# Patient Record
Sex: Female | Born: 1945 | Race: White | Hispanic: No | Marital: Married | Smoking: Never smoker
Health system: Southern US, Community
[De-identification: ages and names within clinical notes are randomized; demographics above are authoritative.]

## PROBLEM LIST (undated history)

## (undated) DIAGNOSIS — H269 Unspecified cataract: Secondary | ICD-10-CM

## (undated) DIAGNOSIS — H409 Unspecified glaucoma: Secondary | ICD-10-CM

## (undated) DIAGNOSIS — R011 Cardiac murmur, unspecified: Secondary | ICD-10-CM

## (undated) DIAGNOSIS — M1712 Unilateral primary osteoarthritis, left knee: Secondary | ICD-10-CM

## (undated) DIAGNOSIS — M1990 Unspecified osteoarthritis, unspecified site: Secondary | ICD-10-CM

## (undated) DIAGNOSIS — F419 Anxiety disorder, unspecified: Secondary | ICD-10-CM

## (undated) DIAGNOSIS — Z8489 Family history of other specified conditions: Secondary | ICD-10-CM

## (undated) DIAGNOSIS — E7800 Pure hypercholesterolemia, unspecified: Secondary | ICD-10-CM

## (undated) DIAGNOSIS — I10 Essential (primary) hypertension: Secondary | ICD-10-CM

## (undated) DIAGNOSIS — K219 Gastro-esophageal reflux disease without esophagitis: Secondary | ICD-10-CM

## (undated) HISTORY — DX: Unspecified glaucoma: H40.9

## (undated) HISTORY — DX: Pure hypercholesterolemia, unspecified: E78.00

## (undated) HISTORY — DX: Unspecified cataract: H26.9

## (undated) HISTORY — DX: Gastro-esophageal reflux disease without esophagitis: K21.9

## (undated) HISTORY — DX: Cardiac murmur, unspecified: R01.1

## (undated) HISTORY — PX: JOINT REPLACEMENT: SHX530

## (undated) HISTORY — PX: KNEE SURGERY: SHX244

## (undated) HISTORY — PX: CHOLECYSTECTOMY OPEN: SUR202

## (undated) HISTORY — PX: COLONOSCOPY W/ POLYPECTOMY: SHX1380

## (undated) HISTORY — PX: KNEE ARTHROSCOPY W/ MENISCECTOMY: SHX1879

## (undated) HISTORY — PX: APPENDECTOMY: SHX54

## (undated) HISTORY — DX: Essential (primary) hypertension: I10

## (undated) HISTORY — PX: OOPHORECTOMY: SHX86

## (undated) HISTORY — PX: TUBAL LIGATION: SHX77

---

## 2000-07-09 ENCOUNTER — Encounter: Payer: Self-pay | Admitting: Cardiology

## 2000-07-09 ENCOUNTER — Ambulatory Visit (HOSPITAL_COMMUNITY): Admission: RE | Admit: 2000-07-09 | Discharge: 2000-07-09 | Payer: Self-pay | Admitting: Cardiology

## 2001-04-01 ENCOUNTER — Ambulatory Visit (HOSPITAL_COMMUNITY): Admission: RE | Admit: 2001-04-01 | Discharge: 2001-04-01 | Payer: Self-pay | Admitting: Internal Medicine

## 2001-04-01 ENCOUNTER — Encounter: Payer: Self-pay | Admitting: Internal Medicine

## 2001-04-29 ENCOUNTER — Other Ambulatory Visit: Admission: RE | Admit: 2001-04-29 | Discharge: 2001-04-29 | Payer: Self-pay | Admitting: Family Medicine

## 2002-02-25 ENCOUNTER — Encounter: Payer: Self-pay | Admitting: Emergency Medicine

## 2002-02-25 ENCOUNTER — Emergency Department (HOSPITAL_COMMUNITY): Admission: EM | Admit: 2002-02-25 | Discharge: 2002-02-25 | Payer: Self-pay | Admitting: Emergency Medicine

## 2003-04-24 ENCOUNTER — Ambulatory Visit (HOSPITAL_COMMUNITY): Admission: RE | Admit: 2003-04-24 | Discharge: 2003-04-24 | Payer: Self-pay | Admitting: Family Medicine

## 2004-09-25 ENCOUNTER — Ambulatory Visit (HOSPITAL_COMMUNITY): Admission: RE | Admit: 2004-09-25 | Discharge: 2004-09-25 | Payer: Self-pay | Admitting: Family Medicine

## 2004-12-05 ENCOUNTER — Ambulatory Visit (HOSPITAL_COMMUNITY): Admission: RE | Admit: 2004-12-05 | Discharge: 2004-12-05 | Payer: Self-pay | Admitting: Obstetrics & Gynecology

## 2004-12-16 ENCOUNTER — Other Ambulatory Visit: Admission: RE | Admit: 2004-12-16 | Discharge: 2004-12-16 | Payer: Self-pay | Admitting: Obstetrics and Gynecology

## 2004-12-26 ENCOUNTER — Emergency Department (HOSPITAL_COMMUNITY): Admission: EM | Admit: 2004-12-26 | Discharge: 2004-12-26 | Payer: Self-pay | Admitting: Emergency Medicine

## 2005-07-09 ENCOUNTER — Ambulatory Visit (HOSPITAL_COMMUNITY): Admission: RE | Admit: 2005-07-09 | Discharge: 2005-07-09 | Payer: Self-pay | Admitting: Family Medicine

## 2005-10-02 ENCOUNTER — Ambulatory Visit (HOSPITAL_COMMUNITY): Admission: RE | Admit: 2005-10-02 | Discharge: 2005-10-02 | Payer: Self-pay | Admitting: General Surgery

## 2006-11-12 ENCOUNTER — Ambulatory Visit (HOSPITAL_COMMUNITY): Admission: RE | Admit: 2006-11-12 | Discharge: 2006-11-12 | Payer: Self-pay | Admitting: Family Medicine

## 2007-11-30 ENCOUNTER — Ambulatory Visit (HOSPITAL_COMMUNITY): Admission: RE | Admit: 2007-11-30 | Discharge: 2007-11-30 | Payer: Self-pay | Admitting: Family Medicine

## 2008-01-12 ENCOUNTER — Other Ambulatory Visit: Admission: RE | Admit: 2008-01-12 | Discharge: 2008-01-12 | Payer: Self-pay | Admitting: Obstetrics and Gynecology

## 2008-05-03 ENCOUNTER — Emergency Department (HOSPITAL_COMMUNITY): Admission: EM | Admit: 2008-05-03 | Discharge: 2008-05-04 | Payer: Self-pay | Admitting: Emergency Medicine

## 2008-05-29 ENCOUNTER — Ambulatory Visit: Payer: Self-pay | Admitting: Gastroenterology

## 2008-05-30 ENCOUNTER — Encounter: Payer: Self-pay | Admitting: Gastroenterology

## 2008-05-30 ENCOUNTER — Ambulatory Visit: Payer: Self-pay | Admitting: Gastroenterology

## 2008-05-30 ENCOUNTER — Ambulatory Visit (HOSPITAL_COMMUNITY): Admission: RE | Admit: 2008-05-30 | Discharge: 2008-05-30 | Payer: Self-pay | Admitting: Gastroenterology

## 2008-07-24 ENCOUNTER — Ambulatory Visit: Payer: Self-pay | Admitting: Gastroenterology

## 2009-05-14 ENCOUNTER — Encounter: Payer: Self-pay | Admitting: Gastroenterology

## 2009-06-21 ENCOUNTER — Encounter (INDEPENDENT_AMBULATORY_CARE_PROVIDER_SITE_OTHER): Payer: Self-pay | Admitting: *Deleted

## 2010-06-23 ENCOUNTER — Encounter: Payer: Self-pay | Admitting: Family Medicine

## 2010-07-01 NOTE — Letter (Signed)
Summary: Appointment Reminder  [facility]  [address]   [place], [place] [address]   Phone: [phone]  Fax: [phone]       June 21, 2009   [name] [address] [place], [place]  [address] 11/08/1945    Dear Ms. Alvarado,  We have been unable to reach you by phone to schedule a follow up   appointment that was recommended for you by Dr. Fields. It is very   important that we reach you to schedule an appointment. We hope that you  allow us to participate in your health care needs. Please contact us at  [phone] at your earliest convenience to schedule your appointment.  Sincerely,    Virginia Urshel  [facility] R. Michael Rourk, M.D.    Sandi Manus, M.D. Kandice Jones, FNP-BC    Leslie Lewis, PA-C Phone: [phone]    Fax: [phone] 

## 2010-07-01 NOTE — Letter (Signed)
Summary: Appointment Reminder  [facility]  [address]   [place], [place] [address]   Phone: [phone]  Fax: [phone]       June 21, 2009   [name] [address] [place], [place]  [address] [birth year]    Dear Ms. CAYTON,  We have been unable to reach you by phone to schedule a follow up   appointment that was recommended for you by Dr. Darrick Penna. It is very   important that we reach you to schedule an appointment. We hope that you  allow [place] to participate in your health care needs. Please contact us at  [phone] at your earliest convenience to schedule your appointment.  Sincerely,    [facility] R. Roetta Sessions, M.D.    Kassie Mends, M.D. Lorenza Burton, FNP-BC    Tana Coast, PA-C Phone: [phone]    Fax: [phone]

## 2010-09-10 ENCOUNTER — Other Ambulatory Visit: Payer: Self-pay | Admitting: Obstetrics and Gynecology

## 2010-09-10 DIAGNOSIS — Z139 Encounter for screening, unspecified: Secondary | ICD-10-CM

## 2010-09-22 ENCOUNTER — Ambulatory Visit (HOSPITAL_COMMUNITY)
Admission: RE | Admit: 2010-09-22 | Discharge: 2010-09-22 | Disposition: A | Discharge status: Home / Self Care | Payer: Medicare Other | Source: Ambulatory Visit | Attending: Obstetrics and Gynecology | Admitting: Obstetrics and Gynecology

## 2010-09-22 DIAGNOSIS — Z139 Encounter for screening, unspecified: Secondary | ICD-10-CM

## 2010-09-22 DIAGNOSIS — Z1231 Encounter for screening mammogram for malignant neoplasm of breast: Secondary | ICD-10-CM | POA: Insufficient documentation

## 2010-10-14 NOTE — Op Note (Signed)
[name], [name]                 ACCOUNT NO.:  [id number]   MEDICAL RECORD NO.:  [id number]          PATIENT TYPE:  AMB   LOCATION:  DAY                           FACILITY:  [facility]   PHYSICIAN:  Kassie Mends, M.D.      DATE OF BIRTH:  [birth year]   DATE OF PROCEDURE:  05/30/2008  DATE OF DISCHARGE:                               OPERATIVE REPORT   REFERRING [name]:  Kirk Ruths, MD   PROCEDURE:  Esophagogastroduodenoscopy with cold forceps biopsy and  Savary dilation to 17 mm.   INDICATION FOR EXAMINATION:  Ms. [name] is a 65 year old female who  presents with solid dysphagia.  She also uses aspirin and diclofenac on  a regular basis.  She had a colonoscopy 3 years ago by Dr. Lovell Sheehan,  which showed sigmoid colon diverticulosis, otherwise was unremarkable.   FINDINGS:  1. Distal esophageal ring.  Otherwise, no evidence of Barrett mass,      erosion, or ulceration.  The distal esophagus was dilated to 1/7th-      mm with the Savary dilator.  2. Patchy erythema in the body and the antrum with occasional erosion.      Biopsies obtained via cold forceps to evaluate for H. pylori      gastritis.  3. Normal duodenum, and second portion of the duodenal bulb and second      portion of the duodenum.   DIAGNOSES:  1. Distal esophageal ring, status post dilation.  2. Mild gastritis.   RECOMMENDATIONS:  1. She should continue Nexium 30 minutes prior to the first meal.  2. She should hold the aspirin and diclofenac until June 12, 2008.  3. She is given a handout on reflux and gastritis.  4. No anticoagulation for 5 days.   MEDICATIONS:  1. Demerol 100 mg IV.  2. Versed 4 mg IV.  3. Phenergan 12.5 mg IV.   PROCEDURE TECHNIQUE:  Physical exam was performed.  Informed consent was  obtained from the patient after explaining the benefits, risks, and  alternatives to the procedure.  The patient connected to monitor and  placed in the left lateral position.  Continuous oxygen was  provided by  nasal cannula.  IV medicine administered through an indwelling cannula.  After administration of sedation, the patient's esophagus was intubated.  The scope was advanced under direct visualization to the second portion  of the duodenum.  The scope was withdrawn slowly by carefully examining  the color, texture, anatomy, and integrity of the mucosa on the way out.  Prior to removing the scope, a retroflexed view of the cardia was  performed and distal esophageal ring was identified.  The Savary  guidewire was inserted.  The esophagus was dilated successively using  the 14-mm to 17-mm dilator.  The dilator and the guidewire were removed.  The patient was recovered in endoscopy and discharged home in  satisfactory condition.   PATH:  Chronic active gastitis. Minimize use of NSAIDs. Continue Nexium.      Kassie Mends, M.D.  Electronically Signed     SM/MEDQ  D:  05/30/2008  T:  05/31/2008  Job:  [id number]   cc:   Kirk Ruths, M.D.  Fax: [phone]

## 2010-10-14 NOTE — Assessment & Plan Note (Signed)
[name], [name]                  CHART#:  [id number]   DATE:  07/24/2008                       DOB:  [birth year]   REFERRING Devera Englander:  Kirk Ruths, MD   PROBLEM LIST:  1. Distal esophageal ring dilated to 17 mm in December 2009.  2. Mild gastritis.  3. Allergies.  4. Insomnia.  5. Arthritis.  6. Cholecystectomy in 1980s (stones).   SUBJECTIVE:  The patient is a 65 year old female who was seen in  December 2009 for difficulty swallowing.  She also had a history of H.  pylori gastritis, which was treated.  She subsequently underwent EGD  with dilation in December 2009.  She reports that she is not having any  problems swallowing.  She would like to restart the diclofenac because  it works for her arthritis.  She has only used Carafate once since her  upper endoscopy.  She is not have any problems with heartburn or  indigestion.   ALLERGIES:  Cefzil.   MEDICATIONS:  1. Xanax 0.5 mg daily.  2. Hydrochlorothiazide 12.5 mg daily.  3. Sucralfate as needed.  4. Fish oil.  5. CoQ10 two daily.  6. Vitamin D3 daily.  7. Multivitamin daily.  8. Zyrtec daily.  9. Aspirin 81 mg daily.  10.Tylenol Arthritis as needed.  11.Prilosec over the counter twice a day.   OBJECTIVE:  VITAL SIGNS:  Weight 186 pounds (up 3 pounds since December  2009), height 5 feet 5 inches, temperature 97.9, blood pressure 130/82,  and pulse 76.  GENERAL:  She is in no apparent distress.  Alert and oriented x4. LUNGS:  Clear to auscultation bilaterally.CARDIOVASCULAR:  Regular rhythm.  ABDOMEN:  Bowel sounds present, soft, nontender, nondistended.   ASSESSMENT:  The patient is a 65 year old female who had dysphagia,  which has now resolved.  She does not have any problem with heartburn or  indigestion while taking her Prilosec twice a day.  Thank you for  allowing me to see the patient in consultation.  My recommendations  follow.   RECOMMENDATIONS:  1. She may try to decrease the omeprazole to  once a day.  2. Follow up appointment in 12 months. Will refill meds up to one      year.       Kassie Mends, M.D.  Electronically Signed     SM/MEDQ  D:  07/25/2008  T:  07/25/2008  Job:  [id number]   cc:   Kirk Ruths, M.D.

## 2010-10-14 NOTE — Consult Note (Signed)
[name], [name]                 ACCOUNT NO.:  [id number]   MEDICAL RECORD NO.:  [id number]          PATIENT TYPE:  AMB   LOCATION:  DAY                           FACILITY:  [facility]   PHYSICIAN:  Kassie Mends, M.D.      DATE OF BIRTH:  27-Jun-1945   DATE OF CONSULTATION:  05/29/2008  DATE OF DISCHARGE:                                 CONSULTATION   REASON FOR CONSULTATION:  Dysphagia.   HISTORY OF PRESENT ILLNESS:  Ms. [name] is a 65 year-old female who has  gastroesophageal reflux disease and is usually maintained on Nexium.  She ran out of her Nexium and began to use over the counter Prilosec.  She was taking one.  She went to [employer] and ate salsa, and 2 hours  later she couldn't swallow good.  She ended up having chest pain and  going to the emergency department.  She was given medication and  Protonix.  The Protonix helps.  She has seen by Ms. Evans and sucralfate  was added as well.  She states her insurance is going to run out by the  end of the year.  She cannot afford the policy, which covers  medications.  She will have insurance for hospitalizations.  She  denies any heartburn and indigestion while taking Nexium or Protonix.  Her symptoms are improved since her ED visit on May 04, 2008.  She  still continues to have had intermittent problems with meat or food with  peeling.  She has had an intentional 50-pound weight loss over the last  5 years.  She denies any nausea, vomiting or abdominal pain.  She is not  have any diarrhea, constipation, blood or stool or black tarry stools.  She takes an aspirin 81 mg daily, but denies BC Powders, Goody's  Powders, ibuprofen or Motrin.  She had a history of H. pylori gastritis  and she states she was told by Dr. Regino Schultze that she should never take  ibuprofen or Motrin again.  She reports having a colonoscopy 3 years ago  at [facility].   PAST MEDICAL HISTORY:  1. Allergies.  2. Insomnia.  3. History of H. pylori gastritis.  4.  Arthritis in her knees.   PAST SURGICAL HISTORY:  1. Cholecystectomy in the 1980s performed for pain and nausea and      vomiting.  Stones were found and her symptoms improved.  2. Knee surgery.  3. Tubal ligation.  4. One ovary removed secondary to cysts.  The patient is unsure which      one.   ALLERGIES:  CEFZIL causes a rash.   MEDICATIONS:  1. Xanax 0.5 mg nightly.  2. Diclofenac 75 mg daily.  3. Nexium 40 mg daily.  4. Hydrochlorothiazide 25 mg half daily.  5. Sucralfate 1 p.o. 2 to 4 times a day.  6. Fish oil.  7. Co-Q 10.  8. Vitamin D.  9. Multivitamin.  10.Zyrtec.  11.Aspirin 81 mg daily.   FAMILY HISTORY:  Her mother had breast cancer.  She has no family  history of colon cancer,  colon polyps, ovarian or uterine cancer.   SOCIAL HISTORY:  She is a widow for approximately 4 years.  She had 4  children.  Her youngest is 76 years old.  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  She has never smoked and she does not regularly  consume alcohol.  She has been a social drinker in the past.   REVIEW OF SYSTEMS:  Per the HPI.  Otherwise all systems are negative.   PHYSICAL EXAM:  VITAL SIGNS:  Weight 183 pounds, height 5 feet 5 inches,  BMI 30.5 (obese), temperature 98.1, blood pressure 110/72, pulse 68.  GENERAL:  She is in no apparent distress, alert and oriented x4.  HEENT:  Exam is atraumatic, normocephalic.  Pupils equal and react to light.  Mouth with no oral lesions.  Posterior pharynx without erythema or  exudate.  NECK:  Full range of motion.  No lymphadenopathy.  LUNGS:  Clear to auscultation bilaterally.  CARDIOVASCULAR:  Shows regular  rhythm, no murmur, normal S1, S2.  ABDOMEN:  Bowel sounds are present,  soft, nontender, nondistended.  No rebound or guarding.  She has no  hepatosplenomegaly. EXTREMITIES:  No cyanosis or edema.  NEURO:  She has  no focal neurologic deficits.   ASSESSMENT:  Ms. [name] is a 65 year old female who was seen in the   emergency department on May 04, 2008 for difficulty swallowing and  she was given Protonix and a GI cocktail.  Her symptoms improved.  Most  likely etiology for her dysphagia is uncontrolled gastroesophageal  reflux disease.  The differential diagnoses include peptic stricture and  a low likelihood of esophageal malignancy or malignancy of the GE  junction.  Thank for allowing me to see [name] in consultation.  My  recommendations follow.   RECOMMENDATIONS:  1. She will be scheduled for an EGD with dilation on tomorrow at 1      p.m.  After the EGD she will be given the [facility] handout on      management of gastroesophageal reflux disease.  2. She should continue Nexium 40 mg 1 p.o. 30 minutes before her first      meal.  She will fill out paperwork for the patient's assistance      program as of June 01, 2008.  3. She may use Prilosec 40 mg as an alternative to Nexium and the      Carafate up to four times a day.  She is given      a prescription for Nexium 40 mg daily and Carafate 1 gram capsules      4 x a day with 28-month supply and 5 refills.  4. She has a follow up appointment to see me in 2 months.      Kassie Mends, M.D.  Electronically Signed     SM/MEDQ  D:  05/29/2008  T:  05/29/2008  Job:  [id number]   cc:   Kirk Ruths, M.D.  Fax: [phone]

## 2010-10-17 NOTE — H&P (Signed)
[name], [name]                 ACCOUNT NO.:  [id number]   MEDICAL RECORD NO.:  [id number]          PATIENT TYPE:  AMB   LOCATION:  DAY                           FACILITY:  [facility]   PHYSICIAN:  Dalia Heading, M.D.  DATE OF BIRTH:  03-May-1946   DATE OF ADMISSION:  10/02/2005  DATE OF DISCHARGE:  LH                                HISTORY & PHYSICAL   CHIEF COMPLAINT:  Left-sided abdominal pain.   HISTORY OF PRESENT ILLNESS:  The patient is a 65 year old white female who  is referred for endoscopic evaluation.  She needs a colonoscopy for left-  sided abdominal pain.  Been present for many months, but seems to be  worsening.  No weight loss, nausea, vomiting, diarrhea, constipation,  melena, hematochezia have been noted.  She has never had a colonoscopy.  There is no family history of colon carcinoma.   PAST MEDICAL HISTORY:  Includes peptic ulcer disease.   PAST SURGICAL HISTORY:  Cholecystectomy, knee surgeries, tubal ligation,  right breast biopsy.   CURRENT MEDICATIONS:  Baby aspirin, Xanax, diclofenac, hydrochlorothiazide,  Zegerid.   ALLERGIES:  No known drug allergies.   REVIEW OF SYSTEMS:  Noncontributory.   PHYSICAL EXAMINATION:  GENERAL:  The patient is a well-developed, well-  nourished white female in no acute distress.  LUNGS:  Clear to auscultation  with equal breath sounds bilaterally.  Heart examination reveals regular  rate and rhythm without S3, S4, or murmurs.  ABDOMEN:  Soft and nondistended.  She has nonspecific tenderness along the  left side of the abdomen.  No hepatosplenomegaly or masses noted.  RECTAL:  Examination was deferred to the procedure.   IMPRESSION:  Abdominal pain of unknown etiology.   PLAN:  The patient is scheduled for colonoscopy on Oct 02, 2005.  Risks and  benefits of the procedure including bleeding and perforation were fully  explained to the patient who gave informed consent.      Dalia Heading, M.D.  Electronically  Signed     MAJ/MEDQ  D:  09/29/2005  T:  09/29/2005  Job:  [id number]   cc:   Patrica Duel, M.D.  Fax: [phone]   SHORT STAY

## 2010-10-17 NOTE — Cardiovascular Report (Signed)
[place]  Patient:    [name], [name]                        MRN: [id number] Proc. Date: 07/09/00 Adm. Date:  [id number] Disc. Date: [id number] Attending:  Ophelia Shoulder                        Cardiac Catheterization  PROCEDURES PERFORMED:  Outpatient cardiac catheterization consisting of: 1. Selective coronary angiography. 2. Retrograde left heart catheterization. 3. Left ventricular angiography. 4. Abdominal aortography.  CARDIOLOGIST:  Madaline Savage, M.D.  COMPLICATIONS:  None.  ENTRY SITE:  Right femoral.  DYE USED:  Omnipaque.  PATIENT PROFILE:  The patient is a 65 year old woman who had a single episode of chest pain and subsequently had a stress test showing anterior and possible inferior ischemia.  She also had a baseline abnormal EKG and a high LDL cholesterol.  The patient entered the catheterization lab today as an outpatient electively for diagnostic catheterization.  RESULTS:  PRESSURES:  The left ventricular pressure was 165/20, central aortic pressure was 165/95.  No aortic valve gradient noted by pullback technique.  ANGIOGRAPHIC RESULTS:  The left main coronary artery, left anterior descending coronary artery, and circumflex coronary artery were all angiographically patent.  The circumflex consisted of basically a single vessel that trifurcated distally.  The LAD gave rise to one major diagonal branch which was patent.  The right coronary artery was dominant.  No lesions were seen.  The abdominal aorta showed no evidence of aneurysm formation.  The renal arteries were normal.  The common iliacs were patent.  FINAL DIAGNOSES: 1. Angiographically patent coronary arteries. 2. Normal left ventricular systolic function. 3. Angiographically patent renal arteries. DD:  07/09/00 TD:  07/10/00 Job: [id number]

## 2010-12-31 ENCOUNTER — Other Ambulatory Visit (HOSPITAL_COMMUNITY)
Admission: RE | Admit: 2010-12-31 | Discharge: 2010-12-31 | Disposition: A | Discharge status: Home / Self Care | Payer: Medicare Other | Source: Ambulatory Visit | Attending: Obstetrics and Gynecology | Admitting: Obstetrics and Gynecology

## 2010-12-31 ENCOUNTER — Other Ambulatory Visit: Payer: Self-pay | Admitting: Adult Health

## 2010-12-31 DIAGNOSIS — Z124 Encounter for screening for malignant neoplasm of cervix: Secondary | ICD-10-CM | POA: Insufficient documentation

## 2011-01-03 ENCOUNTER — Inpatient Hospital Stay (INDEPENDENT_AMBULATORY_CARE_PROVIDER_SITE_OTHER)
Admission: RE | Admit: 2011-01-03 | Discharge: 2011-01-03 | Disposition: A | Discharge status: Home / Self Care | Payer: Medicare Other | Source: Ambulatory Visit | Attending: Emergency Medicine | Admitting: Emergency Medicine

## 2011-01-03 ENCOUNTER — Emergency Department (HOSPITAL_COMMUNITY)
Admission: EM | Admit: 2011-01-03 | Discharge: 2011-01-03 | Disposition: A | Discharge status: Home / Self Care | Payer: Medicare Other | Attending: Emergency Medicine | Admitting: Emergency Medicine

## 2011-01-03 DIAGNOSIS — K219 Gastro-esophageal reflux disease without esophagitis: Secondary | ICD-10-CM | POA: Insufficient documentation

## 2011-01-03 DIAGNOSIS — M129 Arthropathy, unspecified: Secondary | ICD-10-CM | POA: Insufficient documentation

## 2011-01-03 DIAGNOSIS — R10814 Left lower quadrant abdominal tenderness: Secondary | ICD-10-CM

## 2011-01-03 DIAGNOSIS — N390 Urinary tract infection, site not specified: Secondary | ICD-10-CM | POA: Insufficient documentation

## 2011-01-03 DIAGNOSIS — R1032 Left lower quadrant pain: Secondary | ICD-10-CM | POA: Insufficient documentation

## 2011-01-03 DIAGNOSIS — I10 Essential (primary) hypertension: Secondary | ICD-10-CM | POA: Insufficient documentation

## 2011-01-03 LAB — BASIC METABOLIC PANEL
CO2: 26 mEq/L (ref 19–32)
Calcium: 9.6 mg/dL (ref 8.4–10.5)
GFR calc non Af Amer: >60 mL/min (ref >60)

## 2011-01-03 LAB — POCT URINALYSIS DIP (DEVICE)
Glucose, UA: NEGATIVE mg/dL
Protein, ur: NEGATIVE mg/dL
Specific Gravity, Urine: 1.01 (ref 1.005–1.030)
Urobilinogen, UA: 0.2 mg/dL (ref 0.0–1.0)
pH: 6.5 (ref 5.0–8.0)

## 2011-01-03 LAB — DIFFERENTIAL
Basophils Absolute: 0.1 10*3/uL (ref 0.0–0.1)
Eosinophils Absolute: 0.2 10*3/uL (ref 0.0–0.7)
Eosinophils Relative: 2 % (ref 0–5)
Monocytes Absolute: 1.1 10*3/uL — ABNORMAL HIGH (ref 0.1–1.0)
Neutro Abs: 7.8 10*3/uL — ABNORMAL HIGH (ref 1.7–7.7)
Neutrophils Relative %: 62 % (ref 43–77)

## 2011-01-03 LAB — CBC
Hemoglobin: 13.5 g/dL (ref 12.0–15.0)
MCH: 30.1 pg (ref 26.0–34.0)
MCHC: 34.7 g/dL (ref 30.0–36.0)
MCV: 86.8 fL (ref 78.0–100.0)

## 2011-01-03 LAB — URINE MICROSCOPIC-ADD ON

## 2011-01-03 LAB — URINALYSIS, ROUTINE W REFLEX MICROSCOPIC
Bilirubin Urine: NEGATIVE
Ketones, ur: NEGATIVE mg/dL
Nitrite: NEGATIVE
Specific Gravity, Urine: 1.013 (ref 1.005–1.030)
Urobilinogen, UA: 0.2 mg/dL (ref 0.0–1.0)

## 2011-01-04 LAB — URINE CULTURE
Colony Count: 85000
Culture  Setup Time: 201208050133

## 2011-06-09 DIAGNOSIS — M1710 Unilateral primary osteoarthritis, unspecified knee: Secondary | ICD-10-CM | POA: Diagnosis not present

## 2011-06-23 DIAGNOSIS — M546 Pain in thoracic spine: Secondary | ICD-10-CM | POA: Diagnosis not present

## 2011-06-23 DIAGNOSIS — M9981 Other biomechanical lesions of cervical region: Secondary | ICD-10-CM | POA: Diagnosis not present

## 2011-06-23 DIAGNOSIS — M999 Biomechanical lesion, unspecified: Secondary | ICD-10-CM | POA: Diagnosis not present

## 2011-06-23 DIAGNOSIS — M542 Cervicalgia: Secondary | ICD-10-CM | POA: Diagnosis not present

## 2011-06-25 DIAGNOSIS — J019 Acute sinusitis, unspecified: Secondary | ICD-10-CM | POA: Diagnosis not present

## 2011-06-25 DIAGNOSIS — J069 Acute upper respiratory infection, unspecified: Secondary | ICD-10-CM | POA: Diagnosis not present

## 2011-06-25 DIAGNOSIS — Z6829 Body mass index (BMI) 29.0-29.9, adult: Secondary | ICD-10-CM | POA: Diagnosis not present

## 2011-07-10 ENCOUNTER — Other Ambulatory Visit (HOSPITAL_COMMUNITY): Payer: Self-pay | Admitting: Physician Assistant

## 2011-07-10 DIAGNOSIS — J019 Acute sinusitis, unspecified: Secondary | ICD-10-CM | POA: Diagnosis not present

## 2011-07-10 DIAGNOSIS — Z6827 Body mass index (BMI) 27.0-27.9, adult: Secondary | ICD-10-CM | POA: Diagnosis not present

## 2011-07-14 ENCOUNTER — Ambulatory Visit (HOSPITAL_COMMUNITY)
Admission: RE | Admit: 2011-07-14 | Discharge: 2011-07-14 | Disposition: A | Discharge status: Home / Self Care | Payer: Medicare Other | Source: Ambulatory Visit | Attending: Physician Assistant | Admitting: Physician Assistant

## 2011-07-14 DIAGNOSIS — J019 Acute sinusitis, unspecified: Secondary | ICD-10-CM | POA: Insufficient documentation

## 2011-07-14 DIAGNOSIS — R51 Headache: Secondary | ICD-10-CM | POA: Insufficient documentation

## 2011-08-05 DIAGNOSIS — J019 Acute sinusitis, unspecified: Secondary | ICD-10-CM | POA: Diagnosis not present

## 2011-08-05 DIAGNOSIS — R05 Cough: Secondary | ICD-10-CM | POA: Diagnosis not present

## 2011-09-04 DIAGNOSIS — K219 Gastro-esophageal reflux disease without esophagitis: Secondary | ICD-10-CM | POA: Diagnosis not present

## 2011-09-04 DIAGNOSIS — F411 Generalized anxiety disorder: Secondary | ICD-10-CM | POA: Diagnosis not present

## 2011-09-04 DIAGNOSIS — J029 Acute pharyngitis, unspecified: Secondary | ICD-10-CM | POA: Diagnosis not present

## 2011-09-14 DIAGNOSIS — H43399 Other vitreous opacities, unspecified eye: Secondary | ICD-10-CM | POA: Diagnosis not present

## 2011-10-06 ENCOUNTER — Other Ambulatory Visit: Payer: Self-pay | Admitting: Obstetrics and Gynecology

## 2011-10-06 DIAGNOSIS — Z139 Encounter for screening, unspecified: Secondary | ICD-10-CM

## 2011-10-08 ENCOUNTER — Ambulatory Visit (HOSPITAL_COMMUNITY)
Admission: RE | Admit: 2011-10-08 | Discharge: 2011-10-08 | Disposition: A | Discharge status: Home / Self Care | Payer: Medicare Other | Source: Ambulatory Visit | Attending: Obstetrics and Gynecology | Admitting: Obstetrics and Gynecology

## 2011-10-08 DIAGNOSIS — Z1231 Encounter for screening mammogram for malignant neoplasm of breast: Secondary | ICD-10-CM | POA: Diagnosis not present

## 2011-10-08 DIAGNOSIS — Z139 Encounter for screening, unspecified: Secondary | ICD-10-CM

## 2011-10-30 DIAGNOSIS — I10 Essential (primary) hypertension: Secondary | ICD-10-CM | POA: Diagnosis not present

## 2011-10-30 DIAGNOSIS — J019 Acute sinusitis, unspecified: Secondary | ICD-10-CM | POA: Diagnosis not present

## 2011-11-19 DIAGNOSIS — J019 Acute sinusitis, unspecified: Secondary | ICD-10-CM | POA: Diagnosis not present

## 2011-11-19 DIAGNOSIS — I10 Essential (primary) hypertension: Secondary | ICD-10-CM | POA: Diagnosis not present

## 2011-11-26 ENCOUNTER — Ambulatory Visit (INDEPENDENT_AMBULATORY_CARE_PROVIDER_SITE_OTHER): Payer: Medicare Other | Admitting: Otolaryngology

## 2011-12-18 DIAGNOSIS — M546 Pain in thoracic spine: Secondary | ICD-10-CM | POA: Diagnosis not present

## 2011-12-18 DIAGNOSIS — M545 Low back pain: Secondary | ICD-10-CM | POA: Diagnosis not present

## 2011-12-18 DIAGNOSIS — M999 Biomechanical lesion, unspecified: Secondary | ICD-10-CM | POA: Diagnosis not present

## 2011-12-21 DIAGNOSIS — M999 Biomechanical lesion, unspecified: Secondary | ICD-10-CM | POA: Diagnosis not present

## 2011-12-21 DIAGNOSIS — M545 Low back pain: Secondary | ICD-10-CM | POA: Diagnosis not present

## 2011-12-21 DIAGNOSIS — M546 Pain in thoracic spine: Secondary | ICD-10-CM | POA: Diagnosis not present

## 2012-01-26 DIAGNOSIS — M7130 Other bursal cyst, unspecified site: Secondary | ICD-10-CM | POA: Diagnosis not present

## 2012-01-26 DIAGNOSIS — L821 Other seborrheic keratosis: Secondary | ICD-10-CM | POA: Diagnosis not present

## 2012-01-26 DIAGNOSIS — D239 Other benign neoplasm of skin, unspecified: Secondary | ICD-10-CM | POA: Diagnosis not present

## 2012-01-27 DIAGNOSIS — J069 Acute upper respiratory infection, unspecified: Secondary | ICD-10-CM | POA: Diagnosis not present

## 2012-01-27 DIAGNOSIS — Z713 Dietary counseling and surveillance: Secondary | ICD-10-CM | POA: Diagnosis not present

## 2012-01-27 DIAGNOSIS — R05 Cough: Secondary | ICD-10-CM | POA: Diagnosis not present

## 2012-01-27 DIAGNOSIS — Z6831 Body mass index (BMI) 31.0-31.9, adult: Secondary | ICD-10-CM | POA: Diagnosis not present

## 2012-01-27 DIAGNOSIS — Z7182 Exercise counseling: Secondary | ICD-10-CM | POA: Diagnosis not present

## 2012-02-09 DIAGNOSIS — I10 Essential (primary) hypertension: Secondary | ICD-10-CM | POA: Diagnosis not present

## 2012-02-09 DIAGNOSIS — E785 Hyperlipidemia, unspecified: Secondary | ICD-10-CM | POA: Diagnosis not present

## 2012-02-09 DIAGNOSIS — J019 Acute sinusitis, unspecified: Secondary | ICD-10-CM | POA: Diagnosis not present

## 2012-02-26 DIAGNOSIS — Z01419 Encounter for gynecological examination (general) (routine) without abnormal findings: Secondary | ICD-10-CM | POA: Diagnosis not present

## 2012-02-26 DIAGNOSIS — Z1212 Encounter for screening for malignant neoplasm of rectum: Secondary | ICD-10-CM | POA: Diagnosis not present

## 2012-02-29 DIAGNOSIS — L723 Sebaceous cyst: Secondary | ICD-10-CM | POA: Diagnosis not present

## 2012-04-12 DIAGNOSIS — M999 Biomechanical lesion, unspecified: Secondary | ICD-10-CM | POA: Diagnosis not present

## 2012-04-12 DIAGNOSIS — I10 Essential (primary) hypertension: Secondary | ICD-10-CM | POA: Diagnosis not present

## 2012-04-12 DIAGNOSIS — M5430 Sciatica, unspecified side: Secondary | ICD-10-CM | POA: Diagnosis not present

## 2012-04-13 DIAGNOSIS — I10 Essential (primary) hypertension: Secondary | ICD-10-CM | POA: Diagnosis not present

## 2012-04-13 DIAGNOSIS — M5430 Sciatica, unspecified side: Secondary | ICD-10-CM | POA: Diagnosis not present

## 2012-04-13 DIAGNOSIS — M999 Biomechanical lesion, unspecified: Secondary | ICD-10-CM | POA: Diagnosis not present

## 2012-04-13 DIAGNOSIS — M9981 Other biomechanical lesions of cervical region: Secondary | ICD-10-CM | POA: Diagnosis not present

## 2012-04-15 DIAGNOSIS — M999 Biomechanical lesion, unspecified: Secondary | ICD-10-CM | POA: Diagnosis not present

## 2012-04-15 DIAGNOSIS — M5430 Sciatica, unspecified side: Secondary | ICD-10-CM | POA: Diagnosis not present

## 2012-04-15 DIAGNOSIS — M9981 Other biomechanical lesions of cervical region: Secondary | ICD-10-CM | POA: Diagnosis not present

## 2012-04-15 DIAGNOSIS — I10 Essential (primary) hypertension: Secondary | ICD-10-CM | POA: Diagnosis not present

## 2012-04-18 DIAGNOSIS — B079 Viral wart, unspecified: Secondary | ICD-10-CM | POA: Diagnosis not present

## 2012-04-18 DIAGNOSIS — D239 Other benign neoplasm of skin, unspecified: Secondary | ICD-10-CM | POA: Diagnosis not present

## 2012-04-18 DIAGNOSIS — L723 Sebaceous cyst: Secondary | ICD-10-CM | POA: Diagnosis not present

## 2012-04-20 DIAGNOSIS — M5430 Sciatica, unspecified side: Secondary | ICD-10-CM | POA: Diagnosis not present

## 2012-04-20 DIAGNOSIS — I10 Essential (primary) hypertension: Secondary | ICD-10-CM | POA: Diagnosis not present

## 2012-04-20 DIAGNOSIS — M999 Biomechanical lesion, unspecified: Secondary | ICD-10-CM | POA: Diagnosis not present

## 2012-04-20 DIAGNOSIS — M9981 Other biomechanical lesions of cervical region: Secondary | ICD-10-CM | POA: Diagnosis not present

## 2012-05-03 DIAGNOSIS — M1710 Unilateral primary osteoarthritis, unspecified knee: Secondary | ICD-10-CM | POA: Diagnosis not present

## 2012-05-03 DIAGNOSIS — M76899 Other specified enthesopathies of unspecified lower limb, excluding foot: Secondary | ICD-10-CM | POA: Diagnosis not present

## 2012-06-15 DIAGNOSIS — Z7182 Exercise counseling: Secondary | ICD-10-CM | POA: Diagnosis not present

## 2012-06-15 DIAGNOSIS — Z6831 Body mass index (BMI) 31.0-31.9, adult: Secondary | ICD-10-CM | POA: Diagnosis not present

## 2012-06-15 DIAGNOSIS — J343 Hypertrophy of nasal turbinates: Secondary | ICD-10-CM | POA: Diagnosis not present

## 2012-06-15 DIAGNOSIS — R51 Headache: Secondary | ICD-10-CM | POA: Diagnosis not present

## 2012-06-15 DIAGNOSIS — Z713 Dietary counseling and surveillance: Secondary | ICD-10-CM | POA: Diagnosis not present

## 2012-06-22 DIAGNOSIS — J0100 Acute maxillary sinusitis, unspecified: Secondary | ICD-10-CM | POA: Diagnosis not present

## 2012-06-22 DIAGNOSIS — J343 Hypertrophy of nasal turbinates: Secondary | ICD-10-CM | POA: Diagnosis not present

## 2012-06-22 DIAGNOSIS — J310 Chronic rhinitis: Secondary | ICD-10-CM | POA: Diagnosis not present

## 2012-07-04 DIAGNOSIS — H40059 Ocular hypertension, unspecified eye: Secondary | ICD-10-CM | POA: Diagnosis not present

## 2012-07-19 DIAGNOSIS — H903 Sensorineural hearing loss, bilateral: Secondary | ICD-10-CM | POA: Diagnosis not present

## 2012-07-19 DIAGNOSIS — J343 Hypertrophy of nasal turbinates: Secondary | ICD-10-CM | POA: Diagnosis not present

## 2012-07-19 DIAGNOSIS — H6980 Other specified disorders of Eustachian tube, unspecified ear: Secondary | ICD-10-CM | POA: Diagnosis not present

## 2012-07-19 DIAGNOSIS — J310 Chronic rhinitis: Secondary | ICD-10-CM | POA: Diagnosis not present

## 2012-09-14 ENCOUNTER — Other Ambulatory Visit (HOSPITAL_COMMUNITY): Payer: Self-pay | Admitting: Family Medicine

## 2012-09-14 DIAGNOSIS — Z139 Encounter for screening, unspecified: Secondary | ICD-10-CM

## 2012-11-14 DIAGNOSIS — Z6831 Body mass index (BMI) 31.0-31.9, adult: Secondary | ICD-10-CM | POA: Diagnosis not present

## 2012-11-14 DIAGNOSIS — K219 Gastro-esophageal reflux disease without esophagitis: Secondary | ICD-10-CM | POA: Diagnosis not present

## 2012-11-14 DIAGNOSIS — M999 Biomechanical lesion, unspecified: Secondary | ICD-10-CM | POA: Diagnosis not present

## 2012-11-14 DIAGNOSIS — M545 Low back pain: Secondary | ICD-10-CM | POA: Diagnosis not present

## 2012-11-14 DIAGNOSIS — I10 Essential (primary) hypertension: Secondary | ICD-10-CM | POA: Diagnosis not present

## 2012-11-14 DIAGNOSIS — F411 Generalized anxiety disorder: Secondary | ICD-10-CM | POA: Diagnosis not present

## 2012-11-14 DIAGNOSIS — E785 Hyperlipidemia, unspecified: Secondary | ICD-10-CM | POA: Diagnosis not present

## 2012-11-14 DIAGNOSIS — M5430 Sciatica, unspecified side: Secondary | ICD-10-CM | POA: Diagnosis not present

## 2012-11-15 ENCOUNTER — Inpatient Hospital Stay (HOSPITAL_COMMUNITY): Admission: RE | Admit: 2012-11-15 | Payer: Medicare Other | Source: Ambulatory Visit

## 2012-11-16 DIAGNOSIS — M9981 Other biomechanical lesions of cervical region: Secondary | ICD-10-CM | POA: Diagnosis not present

## 2012-11-16 DIAGNOSIS — I10 Essential (primary) hypertension: Secondary | ICD-10-CM | POA: Diagnosis not present

## 2012-11-16 DIAGNOSIS — M999 Biomechanical lesion, unspecified: Secondary | ICD-10-CM | POA: Diagnosis not present

## 2012-11-16 DIAGNOSIS — M5430 Sciatica, unspecified side: Secondary | ICD-10-CM | POA: Diagnosis not present

## 2012-11-18 DIAGNOSIS — M5430 Sciatica, unspecified side: Secondary | ICD-10-CM | POA: Diagnosis not present

## 2012-11-18 DIAGNOSIS — M999 Biomechanical lesion, unspecified: Secondary | ICD-10-CM | POA: Diagnosis not present

## 2012-11-18 DIAGNOSIS — I10 Essential (primary) hypertension: Secondary | ICD-10-CM | POA: Diagnosis not present

## 2012-11-18 DIAGNOSIS — M9981 Other biomechanical lesions of cervical region: Secondary | ICD-10-CM | POA: Diagnosis not present

## 2012-11-21 DIAGNOSIS — M5430 Sciatica, unspecified side: Secondary | ICD-10-CM | POA: Diagnosis not present

## 2012-11-21 DIAGNOSIS — I10 Essential (primary) hypertension: Secondary | ICD-10-CM | POA: Diagnosis not present

## 2012-11-21 DIAGNOSIS — M999 Biomechanical lesion, unspecified: Secondary | ICD-10-CM | POA: Diagnosis not present

## 2012-11-21 DIAGNOSIS — M9981 Other biomechanical lesions of cervical region: Secondary | ICD-10-CM | POA: Diagnosis not present

## 2012-11-23 DIAGNOSIS — M9981 Other biomechanical lesions of cervical region: Secondary | ICD-10-CM | POA: Diagnosis not present

## 2012-11-23 DIAGNOSIS — I10 Essential (primary) hypertension: Secondary | ICD-10-CM | POA: Diagnosis not present

## 2012-11-23 DIAGNOSIS — M5430 Sciatica, unspecified side: Secondary | ICD-10-CM | POA: Diagnosis not present

## 2012-11-23 DIAGNOSIS — M999 Biomechanical lesion, unspecified: Secondary | ICD-10-CM | POA: Diagnosis not present

## 2012-11-24 DIAGNOSIS — M999 Biomechanical lesion, unspecified: Secondary | ICD-10-CM | POA: Diagnosis not present

## 2012-11-24 DIAGNOSIS — I10 Essential (primary) hypertension: Secondary | ICD-10-CM | POA: Diagnosis not present

## 2012-11-24 DIAGNOSIS — M9981 Other biomechanical lesions of cervical region: Secondary | ICD-10-CM | POA: Diagnosis not present

## 2012-11-24 DIAGNOSIS — M5430 Sciatica, unspecified side: Secondary | ICD-10-CM | POA: Diagnosis not present

## 2012-12-05 DIAGNOSIS — M9981 Other biomechanical lesions of cervical region: Secondary | ICD-10-CM | POA: Diagnosis not present

## 2012-12-05 DIAGNOSIS — I10 Essential (primary) hypertension: Secondary | ICD-10-CM | POA: Diagnosis not present

## 2012-12-05 DIAGNOSIS — M999 Biomechanical lesion, unspecified: Secondary | ICD-10-CM | POA: Diagnosis not present

## 2012-12-05 DIAGNOSIS — M5430 Sciatica, unspecified side: Secondary | ICD-10-CM | POA: Diagnosis not present

## 2012-12-06 ENCOUNTER — Other Ambulatory Visit (HOSPITAL_COMMUNITY): Payer: Self-pay | Admitting: Family Medicine

## 2012-12-06 DIAGNOSIS — Z09 Encounter for follow-up examination after completed treatment for conditions other than malignant neoplasm: Secondary | ICD-10-CM

## 2012-12-08 ENCOUNTER — Ambulatory Visit (HOSPITAL_COMMUNITY)
Admission: RE | Admit: 2012-12-08 | Discharge: 2012-12-08 | Disposition: A | Discharge status: Home / Self Care | Payer: Medicare Other | Source: Ambulatory Visit | Attending: Family Medicine | Admitting: Family Medicine

## 2012-12-08 DIAGNOSIS — Z1231 Encounter for screening mammogram for malignant neoplasm of breast: Secondary | ICD-10-CM | POA: Insufficient documentation

## 2012-12-08 DIAGNOSIS — Z09 Encounter for follow-up examination after completed treatment for conditions other than malignant neoplasm: Secondary | ICD-10-CM

## 2012-12-14 DIAGNOSIS — M9981 Other biomechanical lesions of cervical region: Secondary | ICD-10-CM | POA: Diagnosis not present

## 2012-12-14 DIAGNOSIS — M999 Biomechanical lesion, unspecified: Secondary | ICD-10-CM | POA: Diagnosis not present

## 2012-12-14 DIAGNOSIS — M5430 Sciatica, unspecified side: Secondary | ICD-10-CM | POA: Diagnosis not present

## 2012-12-27 DIAGNOSIS — IMO0002 Reserved for concepts with insufficient information to code with codable children: Secondary | ICD-10-CM | POA: Diagnosis not present

## 2013-01-03 DIAGNOSIS — M1710 Unilateral primary osteoarthritis, unspecified knee: Secondary | ICD-10-CM | POA: Diagnosis not present

## 2013-01-16 DIAGNOSIS — J310 Chronic rhinitis: Secondary | ICD-10-CM | POA: Diagnosis not present

## 2013-01-16 DIAGNOSIS — J343 Hypertrophy of nasal turbinates: Secondary | ICD-10-CM | POA: Diagnosis not present

## 2013-01-16 DIAGNOSIS — H6980 Other specified disorders of Eustachian tube, unspecified ear: Secondary | ICD-10-CM | POA: Diagnosis not present

## 2013-01-21 DIAGNOSIS — H6690 Otitis media, unspecified, unspecified ear: Secondary | ICD-10-CM | POA: Diagnosis not present

## 2013-01-21 DIAGNOSIS — Z6830 Body mass index (BMI) 30.0-30.9, adult: Secondary | ICD-10-CM | POA: Diagnosis not present

## 2013-01-21 DIAGNOSIS — J019 Acute sinusitis, unspecified: Secondary | ICD-10-CM | POA: Diagnosis not present

## 2013-02-01 DIAGNOSIS — M4712 Other spondylosis with myelopathy, cervical region: Secondary | ICD-10-CM | POA: Diagnosis not present

## 2013-02-01 DIAGNOSIS — M9981 Other biomechanical lesions of cervical region: Secondary | ICD-10-CM | POA: Diagnosis not present

## 2013-02-01 DIAGNOSIS — M531 Cervicobrachial syndrome: Secondary | ICD-10-CM | POA: Diagnosis not present

## 2013-02-03 DIAGNOSIS — M531 Cervicobrachial syndrome: Secondary | ICD-10-CM | POA: Diagnosis not present

## 2013-02-03 DIAGNOSIS — M4712 Other spondylosis with myelopathy, cervical region: Secondary | ICD-10-CM | POA: Diagnosis not present

## 2013-02-03 DIAGNOSIS — M9981 Other biomechanical lesions of cervical region: Secondary | ICD-10-CM | POA: Diagnosis not present

## 2013-02-06 DIAGNOSIS — M1710 Unilateral primary osteoarthritis, unspecified knee: Secondary | ICD-10-CM | POA: Diagnosis not present

## 2013-02-07 DIAGNOSIS — M531 Cervicobrachial syndrome: Secondary | ICD-10-CM | POA: Diagnosis not present

## 2013-02-07 DIAGNOSIS — M4712 Other spondylosis with myelopathy, cervical region: Secondary | ICD-10-CM | POA: Diagnosis not present

## 2013-02-07 DIAGNOSIS — M9981 Other biomechanical lesions of cervical region: Secondary | ICD-10-CM | POA: Diagnosis not present

## 2013-02-16 DIAGNOSIS — M9981 Other biomechanical lesions of cervical region: Secondary | ICD-10-CM | POA: Diagnosis not present

## 2013-02-16 DIAGNOSIS — M531 Cervicobrachial syndrome: Secondary | ICD-10-CM | POA: Diagnosis not present

## 2013-02-16 DIAGNOSIS — M4712 Other spondylosis with myelopathy, cervical region: Secondary | ICD-10-CM | POA: Diagnosis not present

## 2013-03-24 DIAGNOSIS — Z23 Encounter for immunization: Secondary | ICD-10-CM | POA: Diagnosis not present

## 2013-03-27 DIAGNOSIS — M9981 Other biomechanical lesions of cervical region: Secondary | ICD-10-CM | POA: Diagnosis not present

## 2013-03-27 DIAGNOSIS — M4712 Other spondylosis with myelopathy, cervical region: Secondary | ICD-10-CM | POA: Diagnosis not present

## 2013-03-27 DIAGNOSIS — M531 Cervicobrachial syndrome: Secondary | ICD-10-CM | POA: Diagnosis not present

## 2013-03-28 DIAGNOSIS — Z6831 Body mass index (BMI) 31.0-31.9, adult: Secondary | ICD-10-CM | POA: Diagnosis not present

## 2013-03-28 DIAGNOSIS — E669 Obesity, unspecified: Secondary | ICD-10-CM | POA: Diagnosis not present

## 2013-03-28 DIAGNOSIS — I10 Essential (primary) hypertension: Secondary | ICD-10-CM | POA: Diagnosis not present

## 2013-06-19 ENCOUNTER — Telehealth: Payer: Self-pay | Admitting: Adult Health

## 2013-06-19 MED ORDER — ESOMEPRAZOLE MAGNESIUM 40 MG PO PACK: 40.0000 mg | PACK | Freq: Every day | ORAL | Status: DC

## 2013-06-19 NOTE — Telephone Encounter (Signed)
Pt back from beach, will rx nexium at [facility] pharmacy

## 2013-06-26 DIAGNOSIS — I10 Essential (primary) hypertension: Secondary | ICD-10-CM | POA: Diagnosis not present

## 2013-06-26 DIAGNOSIS — Z6831 Body mass index (BMI) 31.0-31.9, adult: Secondary | ICD-10-CM | POA: Diagnosis not present

## 2013-06-26 DIAGNOSIS — F411 Generalized anxiety disorder: Secondary | ICD-10-CM | POA: Diagnosis not present

## 2013-06-26 DIAGNOSIS — M549 Dorsalgia, unspecified: Secondary | ICD-10-CM | POA: Diagnosis not present

## 2013-07-20 DIAGNOSIS — IMO0002 Reserved for concepts with insufficient information to code with codable children: Secondary | ICD-10-CM | POA: Diagnosis not present

## 2013-07-26 DIAGNOSIS — M4712 Other spondylosis with myelopathy, cervical region: Secondary | ICD-10-CM | POA: Diagnosis not present

## 2013-07-26 DIAGNOSIS — M531 Cervicobrachial syndrome: Secondary | ICD-10-CM | POA: Diagnosis not present

## 2013-07-26 DIAGNOSIS — M9981 Other biomechanical lesions of cervical region: Secondary | ICD-10-CM | POA: Diagnosis not present

## 2013-08-16 DIAGNOSIS — H40059 Ocular hypertension, unspecified eye: Secondary | ICD-10-CM | POA: Diagnosis not present

## 2013-08-29 DIAGNOSIS — I10 Essential (primary) hypertension: Secondary | ICD-10-CM | POA: Diagnosis not present

## 2013-08-29 DIAGNOSIS — Z6831 Body mass index (BMI) 31.0-31.9, adult: Secondary | ICD-10-CM | POA: Diagnosis not present

## 2013-08-29 DIAGNOSIS — J0100 Acute maxillary sinusitis, unspecified: Secondary | ICD-10-CM | POA: Diagnosis not present

## 2013-08-29 DIAGNOSIS — K219 Gastro-esophageal reflux disease without esophagitis: Secondary | ICD-10-CM | POA: Diagnosis not present

## 2013-08-29 DIAGNOSIS — R42 Dizziness and giddiness: Secondary | ICD-10-CM | POA: Diagnosis not present

## 2013-09-07 DIAGNOSIS — J019 Acute sinusitis, unspecified: Secondary | ICD-10-CM | POA: Diagnosis not present

## 2013-09-13 DIAGNOSIS — M5430 Sciatica, unspecified side: Secondary | ICD-10-CM | POA: Diagnosis not present

## 2013-09-13 DIAGNOSIS — M531 Cervicobrachial syndrome: Secondary | ICD-10-CM | POA: Diagnosis not present

## 2013-09-13 DIAGNOSIS — M9981 Other biomechanical lesions of cervical region: Secondary | ICD-10-CM | POA: Diagnosis not present

## 2013-09-13 DIAGNOSIS — M47817 Spondylosis without myelopathy or radiculopathy, lumbosacral region: Secondary | ICD-10-CM | POA: Diagnosis not present

## 2013-09-13 DIAGNOSIS — M4712 Other spondylosis with myelopathy, cervical region: Secondary | ICD-10-CM | POA: Diagnosis not present

## 2013-09-13 DIAGNOSIS — M999 Biomechanical lesion, unspecified: Secondary | ICD-10-CM | POA: Diagnosis not present

## 2013-09-14 DIAGNOSIS — J310 Chronic rhinitis: Secondary | ICD-10-CM | POA: Diagnosis not present

## 2013-09-14 DIAGNOSIS — J309 Allergic rhinitis, unspecified: Secondary | ICD-10-CM | POA: Diagnosis not present

## 2013-09-14 DIAGNOSIS — Z6830 Body mass index (BMI) 30.0-30.9, adult: Secondary | ICD-10-CM | POA: Diagnosis not present

## 2013-09-14 DIAGNOSIS — J301 Allergic rhinitis due to pollen: Secondary | ICD-10-CM | POA: Diagnosis not present

## 2013-09-15 DIAGNOSIS — M999 Biomechanical lesion, unspecified: Secondary | ICD-10-CM | POA: Diagnosis not present

## 2013-09-15 DIAGNOSIS — M4712 Other spondylosis with myelopathy, cervical region: Secondary | ICD-10-CM | POA: Diagnosis not present

## 2013-09-15 DIAGNOSIS — M5430 Sciatica, unspecified side: Secondary | ICD-10-CM | POA: Diagnosis not present

## 2013-09-15 DIAGNOSIS — M531 Cervicobrachial syndrome: Secondary | ICD-10-CM | POA: Diagnosis not present

## 2013-09-15 DIAGNOSIS — M9981 Other biomechanical lesions of cervical region: Secondary | ICD-10-CM | POA: Diagnosis not present

## 2013-09-15 DIAGNOSIS — M47817 Spondylosis without myelopathy or radiculopathy, lumbosacral region: Secondary | ICD-10-CM | POA: Diagnosis not present

## 2013-09-20 DIAGNOSIS — M9981 Other biomechanical lesions of cervical region: Secondary | ICD-10-CM | POA: Diagnosis not present

## 2013-09-20 DIAGNOSIS — M531 Cervicobrachial syndrome: Secondary | ICD-10-CM | POA: Diagnosis not present

## 2013-09-20 DIAGNOSIS — M4712 Other spondylosis with myelopathy, cervical region: Secondary | ICD-10-CM | POA: Diagnosis not present

## 2013-09-20 DIAGNOSIS — M5430 Sciatica, unspecified side: Secondary | ICD-10-CM | POA: Diagnosis not present

## 2013-09-20 DIAGNOSIS — M999 Biomechanical lesion, unspecified: Secondary | ICD-10-CM | POA: Diagnosis not present

## 2013-09-20 DIAGNOSIS — M47817 Spondylosis without myelopathy or radiculopathy, lumbosacral region: Secondary | ICD-10-CM | POA: Diagnosis not present

## 2013-09-22 DIAGNOSIS — M9981 Other biomechanical lesions of cervical region: Secondary | ICD-10-CM | POA: Diagnosis not present

## 2013-09-22 DIAGNOSIS — M999 Biomechanical lesion, unspecified: Secondary | ICD-10-CM | POA: Diagnosis not present

## 2013-09-22 DIAGNOSIS — M5430 Sciatica, unspecified side: Secondary | ICD-10-CM | POA: Diagnosis not present

## 2013-09-22 DIAGNOSIS — M47817 Spondylosis without myelopathy or radiculopathy, lumbosacral region: Secondary | ICD-10-CM | POA: Diagnosis not present

## 2013-09-22 DIAGNOSIS — M531 Cervicobrachial syndrome: Secondary | ICD-10-CM | POA: Diagnosis not present

## 2013-09-22 DIAGNOSIS — M4712 Other spondylosis with myelopathy, cervical region: Secondary | ICD-10-CM | POA: Diagnosis not present

## 2013-09-28 DIAGNOSIS — M4712 Other spondylosis with myelopathy, cervical region: Secondary | ICD-10-CM | POA: Diagnosis not present

## 2013-09-28 DIAGNOSIS — M47817 Spondylosis without myelopathy or radiculopathy, lumbosacral region: Secondary | ICD-10-CM | POA: Diagnosis not present

## 2013-09-28 DIAGNOSIS — M531 Cervicobrachial syndrome: Secondary | ICD-10-CM | POA: Diagnosis not present

## 2013-09-28 DIAGNOSIS — M999 Biomechanical lesion, unspecified: Secondary | ICD-10-CM | POA: Diagnosis not present

## 2013-09-28 DIAGNOSIS — M9981 Other biomechanical lesions of cervical region: Secondary | ICD-10-CM | POA: Diagnosis not present

## 2013-09-28 DIAGNOSIS — M5430 Sciatica, unspecified side: Secondary | ICD-10-CM | POA: Diagnosis not present

## 2013-11-13 DIAGNOSIS — M47817 Spondylosis without myelopathy or radiculopathy, lumbosacral region: Secondary | ICD-10-CM | POA: Diagnosis not present

## 2013-11-13 DIAGNOSIS — M999 Biomechanical lesion, unspecified: Secondary | ICD-10-CM | POA: Diagnosis not present

## 2013-11-13 DIAGNOSIS — M531 Cervicobrachial syndrome: Secondary | ICD-10-CM | POA: Diagnosis not present

## 2013-11-13 DIAGNOSIS — M5430 Sciatica, unspecified side: Secondary | ICD-10-CM | POA: Diagnosis not present

## 2013-11-13 DIAGNOSIS — M9981 Other biomechanical lesions of cervical region: Secondary | ICD-10-CM | POA: Diagnosis not present

## 2013-11-13 DIAGNOSIS — M4712 Other spondylosis with myelopathy, cervical region: Secondary | ICD-10-CM | POA: Diagnosis not present

## 2013-11-16 DIAGNOSIS — Z6832 Body mass index (BMI) 32.0-32.9, adult: Secondary | ICD-10-CM | POA: Diagnosis not present

## 2013-11-16 DIAGNOSIS — J019 Acute sinusitis, unspecified: Secondary | ICD-10-CM | POA: Diagnosis not present

## 2013-11-16 DIAGNOSIS — F411 Generalized anxiety disorder: Secondary | ICD-10-CM | POA: Diagnosis not present

## 2014-01-01 ENCOUNTER — Other Ambulatory Visit (HOSPITAL_COMMUNITY): Payer: Self-pay | Admitting: Family Medicine

## 2014-01-01 DIAGNOSIS — Z139 Encounter for screening, unspecified: Secondary | ICD-10-CM

## 2014-01-01 DIAGNOSIS — Z1231 Encounter for screening mammogram for malignant neoplasm of breast: Secondary | ICD-10-CM

## 2014-01-04 ENCOUNTER — Ambulatory Visit (HOSPITAL_COMMUNITY)
Admission: RE | Admit: 2014-01-04 | Discharge: 2014-01-04 | Disposition: A | Discharge status: Home / Self Care | Payer: Medicare Other | Source: Ambulatory Visit | Attending: Family Medicine | Admitting: Family Medicine

## 2014-01-04 DIAGNOSIS — Z1231 Encounter for screening mammogram for malignant neoplasm of breast: Secondary | ICD-10-CM | POA: Diagnosis not present

## 2014-01-10 DIAGNOSIS — E785 Hyperlipidemia, unspecified: Secondary | ICD-10-CM | POA: Diagnosis not present

## 2014-01-10 DIAGNOSIS — I10 Essential (primary) hypertension: Secondary | ICD-10-CM | POA: Diagnosis not present

## 2014-01-10 DIAGNOSIS — Z6831 Body mass index (BMI) 31.0-31.9, adult: Secondary | ICD-10-CM | POA: Diagnosis not present

## 2014-01-10 DIAGNOSIS — Z23 Encounter for immunization: Secondary | ICD-10-CM | POA: Diagnosis not present

## 2014-01-15 ENCOUNTER — Other Ambulatory Visit: Payer: Self-pay | Admitting: Adult Health

## 2014-02-12 DIAGNOSIS — H01009 Unspecified blepharitis unspecified eye, unspecified eyelid: Secondary | ICD-10-CM | POA: Diagnosis not present

## 2014-02-28 ENCOUNTER — Other Ambulatory Visit: Payer: Self-pay | Admitting: Adult Health

## 2014-03-01 DIAGNOSIS — Z23 Encounter for immunization: Secondary | ICD-10-CM | POA: Diagnosis not present

## 2014-04-02 ENCOUNTER — Other Ambulatory Visit: Payer: Self-pay | Admitting: Adult Health

## 2014-05-07 DIAGNOSIS — K7689 Other specified diseases of liver: Secondary | ICD-10-CM | POA: Diagnosis not present

## 2014-05-07 DIAGNOSIS — R748 Abnormal levels of other serum enzymes: Secondary | ICD-10-CM | POA: Diagnosis not present

## 2014-05-07 DIAGNOSIS — Z6831 Body mass index (BMI) 31.0-31.9, adult: Secondary | ICD-10-CM | POA: Diagnosis not present

## 2014-05-07 DIAGNOSIS — M1991 Primary osteoarthritis, unspecified site: Secondary | ICD-10-CM | POA: Diagnosis not present

## 2014-05-07 DIAGNOSIS — E782 Mixed hyperlipidemia: Secondary | ICD-10-CM | POA: Diagnosis not present

## 2014-05-28 DIAGNOSIS — M1712 Unilateral primary osteoarthritis, left knee: Secondary | ICD-10-CM | POA: Diagnosis not present

## 2014-07-03 DIAGNOSIS — M170 Bilateral primary osteoarthritis of knee: Secondary | ICD-10-CM | POA: Diagnosis not present

## 2014-07-24 DIAGNOSIS — R0789 Other chest pain: Secondary | ICD-10-CM | POA: Diagnosis not present

## 2014-07-24 DIAGNOSIS — R140 Abdominal distension (gaseous): Secondary | ICD-10-CM | POA: Diagnosis not present

## 2014-07-24 DIAGNOSIS — E6609 Other obesity due to excess calories: Secondary | ICD-10-CM | POA: Diagnosis not present

## 2014-07-24 DIAGNOSIS — Z6831 Body mass index (BMI) 31.0-31.9, adult: Secondary | ICD-10-CM | POA: Diagnosis not present

## 2014-07-24 DIAGNOSIS — K219 Gastro-esophageal reflux disease without esophagitis: Secondary | ICD-10-CM | POA: Diagnosis not present

## 2014-08-17 ENCOUNTER — Encounter (INDEPENDENT_AMBULATORY_CARE_PROVIDER_SITE_OTHER): Payer: Self-pay | Admitting: *Deleted

## 2014-08-20 DIAGNOSIS — H40053 Ocular hypertension, bilateral: Secondary | ICD-10-CM | POA: Diagnosis not present

## 2014-09-11 ENCOUNTER — Encounter (INDEPENDENT_AMBULATORY_CARE_PROVIDER_SITE_OTHER): Payer: Self-pay | Admitting: Internal Medicine

## 2014-09-11 ENCOUNTER — Ambulatory Visit (INDEPENDENT_AMBULATORY_CARE_PROVIDER_SITE_OTHER): Payer: Medicare Other | Admitting: Internal Medicine

## 2014-09-11 VITALS — BP 142/70 | HR 56 | Temp 98.0°F | Ht 65.0 in | Wt 195.1 lb

## 2014-09-11 DIAGNOSIS — E7800 Pure hypercholesterolemia, unspecified: Secondary | ICD-10-CM | POA: Insufficient documentation

## 2014-09-11 DIAGNOSIS — K219 Gastro-esophageal reflux disease without esophagitis: Secondary | ICD-10-CM | POA: Insufficient documentation

## 2014-09-11 NOTE — Progress Notes (Signed)
   Subjective:    Patient ID: [name], female    DOB: February 11, 1946, 69 y.o.   MRN:  HPI Referred by Dr. Hilma Favors ([place]). She tells me she is doing good. Sometimes she has some pain left lower quadrant and she thinks it may be ? Diverticulitis. Her appetite is good. No weight loss. Her acid reflux controlled with Protonix. She usually has a BM daily. No melena or BRRB. She is not exercising. She does walk some.  Her last colonoscopy was in 2007 by Dr. Arnoldo Morale which reveal multiple diverticula in the sigmoid colon otherwise normal.   No family hx of colon cancer.  Last colonoscopy in 10/02/2005 for screening: Dr. Arnoldo Morale; Multiple medium diverticula in the sigmoid colon. Colonoscopy otherwise normal.  Repeat in 10 years    Review of Systems Widowed. 4 children in good health.    Past Medical History  Diagnosis Date  . High cholesterol   . Hypertension   . GERD (gastroesophageal reflux disease)     Past Surgical History  Procedure Laterality Date  . Cholecystectomy      pain  . Tubal ligation    . Ovary removed      for a cyst ? side  . Knee surgery      in the 90s.     Allergies  Allergen Reactions  . Cefazolin     rash    No current outpatient prescriptions on file prior to visit.   No current facility-administered medications on file prior to visit.     Objective:   Physical Exam Blood pressure 142/70, pulse 56, temperature 98 F (36.7 C), height 5\' 5"  (1.651 m), weight 195 lb 1.6 oz (88.497 kg). Alert and oriented. Skin warm and dry. Oral mucosa is moist.   . Sclera anicteric, conjunctivae is pink. Thyroid not enlarged. No cervical lymphadenopathy. Lungs clear. Heart regular rate and rhythm.  Abdomen is soft. Bowel sounds are positive. No hepatomegaly. No abdominal masses felt. No tenderness.  No edema to lower extremities.   Stool brown and guaiac negative.     Developer: [id number], Exp 9-17 Card: Lot 02-14, Exp 07.18    Assessment & Plan:  GERD  controlled at this time. Last colonoscopy in 2007. Next colonoscopy will be in 2017. Information given to Lacretia Nicks

## 2014-09-11 NOTE — Patient Instructions (Signed)
Recall in one year for colonscopy

## 2014-09-13 ENCOUNTER — Encounter (INDEPENDENT_AMBULATORY_CARE_PROVIDER_SITE_OTHER): Payer: Self-pay

## 2014-11-26 DIAGNOSIS — L03031 Cellulitis of right toe: Secondary | ICD-10-CM | POA: Diagnosis not present

## 2014-11-26 DIAGNOSIS — M79674 Pain in right toe(s): Secondary | ICD-10-CM | POA: Diagnosis not present

## 2014-11-26 DIAGNOSIS — L600 Ingrowing nail: Secondary | ICD-10-CM | POA: Diagnosis not present

## 2014-11-26 DIAGNOSIS — M79671 Pain in right foot: Secondary | ICD-10-CM | POA: Diagnosis not present

## 2014-12-17 DIAGNOSIS — M79674 Pain in right toe(s): Secondary | ICD-10-CM | POA: Diagnosis not present

## 2014-12-17 DIAGNOSIS — M79671 Pain in right foot: Secondary | ICD-10-CM | POA: Diagnosis not present

## 2014-12-17 DIAGNOSIS — L600 Ingrowing nail: Secondary | ICD-10-CM | POA: Diagnosis not present

## 2014-12-17 DIAGNOSIS — L03031 Cellulitis of right toe: Secondary | ICD-10-CM | POA: Diagnosis not present

## 2015-01-03 ENCOUNTER — Other Ambulatory Visit (HOSPITAL_COMMUNITY): Payer: Self-pay | Admitting: Family Medicine

## 2015-01-03 DIAGNOSIS — I10 Essential (primary) hypertension: Secondary | ICD-10-CM

## 2015-01-03 DIAGNOSIS — Z1389 Encounter for screening for other disorder: Secondary | ICD-10-CM

## 2015-01-03 DIAGNOSIS — R748 Abnormal levels of other serum enzymes: Secondary | ICD-10-CM | POA: Diagnosis not present

## 2015-01-03 DIAGNOSIS — E6609 Other obesity due to excess calories: Secondary | ICD-10-CM | POA: Diagnosis not present

## 2015-01-03 DIAGNOSIS — Z6832 Body mass index (BMI) 32.0-32.9, adult: Secondary | ICD-10-CM | POA: Diagnosis not present

## 2015-01-03 DIAGNOSIS — G4700 Insomnia, unspecified: Secondary | ICD-10-CM | POA: Diagnosis not present

## 2015-01-03 DIAGNOSIS — E782 Mixed hyperlipidemia: Secondary | ICD-10-CM

## 2015-01-03 DIAGNOSIS — M8580 Other specified disorders of bone density and structure, unspecified site: Secondary | ICD-10-CM

## 2015-01-10 ENCOUNTER — Ambulatory Visit (HOSPITAL_COMMUNITY)
Admission: RE | Admit: 2015-01-10 | Discharge: 2015-01-10 | Disposition: A | Discharge status: Home / Self Care | Payer: Medicare Other | Source: Ambulatory Visit | Attending: Family Medicine | Admitting: Family Medicine

## 2015-01-10 DIAGNOSIS — Z780 Asymptomatic menopausal state: Secondary | ICD-10-CM | POA: Diagnosis not present

## 2015-01-10 DIAGNOSIS — M8580 Other specified disorders of bone density and structure, unspecified site: Secondary | ICD-10-CM | POA: Insufficient documentation

## 2015-01-10 DIAGNOSIS — M8589 Other specified disorders of bone density and structure, multiple sites: Secondary | ICD-10-CM | POA: Diagnosis not present

## 2015-01-11 DIAGNOSIS — Z6832 Body mass index (BMI) 32.0-32.9, adult: Secondary | ICD-10-CM | POA: Diagnosis not present

## 2015-01-11 DIAGNOSIS — Z1389 Encounter for screening for other disorder: Secondary | ICD-10-CM | POA: Diagnosis not present

## 2015-01-11 DIAGNOSIS — M810 Age-related osteoporosis without current pathological fracture: Secondary | ICD-10-CM | POA: Diagnosis not present

## 2015-01-11 DIAGNOSIS — E6609 Other obesity due to excess calories: Secondary | ICD-10-CM | POA: Diagnosis not present

## 2015-01-12 ENCOUNTER — Encounter (HOSPITAL_COMMUNITY): Payer: Self-pay | Admitting: *Deleted

## 2015-01-12 ENCOUNTER — Emergency Department (HOSPITAL_COMMUNITY)
Admission: EM | Admit: 2015-01-12 | Discharge: 2015-01-12 | Disposition: A | Discharge status: Home / Self Care | Payer: Medicare Other | Attending: Emergency Medicine | Admitting: Emergency Medicine

## 2015-01-12 ENCOUNTER — Emergency Department (HOSPITAL_COMMUNITY): Payer: Medicare Other

## 2015-01-12 DIAGNOSIS — M546 Pain in thoracic spine: Secondary | ICD-10-CM | POA: Diagnosis not present

## 2015-01-12 DIAGNOSIS — M549 Dorsalgia, unspecified: Secondary | ICD-10-CM | POA: Diagnosis not present

## 2015-01-12 DIAGNOSIS — R0789 Other chest pain: Secondary | ICD-10-CM | POA: Diagnosis not present

## 2015-01-12 DIAGNOSIS — I10 Essential (primary) hypertension: Secondary | ICD-10-CM | POA: Insufficient documentation

## 2015-01-12 DIAGNOSIS — Z7982 Long term (current) use of aspirin: Secondary | ICD-10-CM | POA: Insufficient documentation

## 2015-01-12 DIAGNOSIS — Z79899 Other long term (current) drug therapy: Secondary | ICD-10-CM | POA: Diagnosis not present

## 2015-01-12 DIAGNOSIS — K219 Gastro-esophageal reflux disease without esophagitis: Secondary | ICD-10-CM | POA: Diagnosis not present

## 2015-01-12 DIAGNOSIS — M4184 Other forms of scoliosis, thoracic region: Secondary | ICD-10-CM | POA: Diagnosis not present

## 2015-01-12 DIAGNOSIS — M542 Cervicalgia: Secondary | ICD-10-CM | POA: Diagnosis not present

## 2015-01-12 DIAGNOSIS — R079 Chest pain, unspecified: Secondary | ICD-10-CM | POA: Insufficient documentation

## 2015-01-12 DIAGNOSIS — M1990 Unspecified osteoarthritis, unspecified site: Secondary | ICD-10-CM | POA: Insufficient documentation

## 2015-01-12 DIAGNOSIS — E780 Pure hypercholesterolemia: Secondary | ICD-10-CM | POA: Diagnosis not present

## 2015-01-12 HISTORY — DX: Unspecified osteoarthritis, unspecified site: M19.90

## 2015-01-12 LAB — BASIC METABOLIC PANEL
Anion gap: 12 (ref 5–15)
BUN: 14 mg/dL (ref 6–20)
CO2: 26 mmol/L (ref 22–32)
CREATININE: 0.86 mg/dL (ref 0.44–1.00)
Calcium: 8.8 mg/dL — ABNORMAL LOW (ref 8.9–10.3)
Chloride: 102 mmol/L (ref 101–111)
GFR calc non Af Amer: >60 mL/min (ref >60)
GLUCOSE: 103 mg/dL — AB (ref 65–99)
Potassium: 3.5 mmol/L (ref 3.5–5.1)
SODIUM: 140 mmol/L (ref 135–145)

## 2015-01-12 LAB — CBC
HEMATOCRIT: 42 % (ref 36.0–46.0)
Hemoglobin: 14.3 g/dL (ref 12.0–15.0)
MCH: 30 pg (ref 26.0–34.0)
MCHC: 34 g/dL (ref 30.0–36.0)
MCV: 88.1 fL (ref 78.0–100.0)
Platelets: 237 10*3/uL (ref 150–400)
RBC: 4.77 MIL/uL (ref 3.87–5.11)
RDW: 12.7 % (ref 11.5–15.5)
WBC: 10.3 10*3/uL (ref 4.0–10.5)

## 2015-01-12 LAB — I-STAT TROPONIN, ED: Troponin i, poc: 0 ng/mL (ref 0.00–0.08)

## 2015-01-12 MED ORDER — IBUPROFEN 200 MG PO TABS
600.0000 mg | ORAL_TABLET | Freq: Once | ORAL | Status: AC
Start: 2015-01-12 — End: 2015-01-12
  Administered 2015-01-12: 600 mg via ORAL
  Filled 2015-01-12: qty 3

## 2015-01-12 MED ORDER — IBUPROFEN 600 MG PO TABS: 600.0000 mg | ORAL_TABLET | Freq: Four times a day (QID) | ORAL | Status: DC | PRN

## 2015-01-12 NOTE — ED Provider Notes (Signed)
CSN: [id number]     Arrival date & time 01/12/15  1452 History   First MD Initiated Contact with Patient 01/12/15 1603     Chief Complaint  Patient presents with  . Chest Pain  . Back Pain  . Neck Pain      HPI Patient presents with waking up with some back pain which associate with some chest tightness this morning.  Pain is worse when she twists or moves.  Denies any fever or chills.  Denies nausea vomiting or diaphoresis.  Has history of osteoporosis. Past Medical History  Diagnosis Date  . High cholesterol   . Hypertension   . GERD (gastroesophageal reflux disease)   . Arthritis    Past Surgical History  Procedure Laterality Date  . Cholecystectomy      pain  . Tubal ligation    . Ovary removed      for a cyst ? side  . Knee surgery      in the 90s.    History reviewed. No pertinent family history. Social History  Substance Use Topics  . Smoking status: Never Smoker   . Smokeless tobacco: None  . Alcohol Use: No   OB History    No data available     Review of Systems  All other systems reviewed and are negative  Allergies  Cefazolin  Home Medications   Prior to Admission medications   Medication Sig Start Date End Date Taking? Authorizing Provider  alendronate (FOSAMAX) 70 MG tablet Take 70 mg by mouth every Friday. Take with a full glass of water on an empty stomach.   Yes Historical Provider, MD  ALPRAZolam Duanne Moron) 0.5 MG tablet Take 0.5 mg by mouth 2 (two) times daily as needed for anxiety.   Yes Historical Provider, MD  amoxicillin-clavulanate (AUGMENTIN) 875-125 MG per tablet Take 1 tablet by mouth 2 (two) times daily. Started 01/06/15, for 10 days ending 01/15/15   Yes Historical Provider, MD  aspirin 81 MG tablet Take 81 mg by mouth daily.   Yes Historical Provider, MD  atorvastatin (LIPITOR) 10 MG tablet Take 10 mg by mouth daily.   Yes Historical Provider, MD  co-enzyme Q-10 30 MG capsule Take 200 mg by mouth daily.   Yes Historical Provider, MD    diclofenac (VOLTAREN) 75 MG EC tablet Take 75 mg by mouth 2 (two) times daily as needed for moderate pain.    Yes Historical Provider, MD  losartan-hydrochlorothiazide (HYZAAR) 50-12.5 MG per tablet Take 1 tablet by mouth daily.   Yes Historical Provider, MD  montelukast (SINGULAIR) 10 MG tablet Take 10 mg by mouth at bedtime.   Yes Historical Provider, MD  pantoprazole (PROTONIX) 40 MG tablet Take 40 mg by mouth daily.   Yes Historical Provider, MD  Probiotic Product (ALIGN PO) Take 1 capsule by mouth daily.    Yes Historical Provider, MD  RA KRILL OIL 500 MG CAPS Take 500 mg by mouth daily.    Yes Historical Provider, MD  sucralfate (CARAFATE) 1 G tablet Take 1 g by mouth 4 (four) times daily as needed (for acid reflux).    Yes Historical Provider, MD  ibuprofen (ADVIL,MOTRIN) 600 MG tablet Take 1 tablet (600 mg total) by mouth every 6 (six) hours as needed. 01/12/15   Leonard Schwartz, MD   BP 139/76 mmHg  Pulse 102  Temp(Src) 99.5 F (37.5 C) (Oral)  Resp 25  Ht 5\' 3"  (1.6 m)  Wt 190 lb (86.183 kg)  BMI 33.67 kg/m2  SpO2 93% Physical Exam  Constitutional: She is oriented to person, place, and time. She appears well-developed and well-nourished. No distress.  HENT:  Head: Normocephalic and atraumatic.  Eyes: Pupils are equal, round, and reactive to light.  Neck: Normal range of motion.  Cardiovascular: Normal rate and intact distal pulses.   Pulmonary/Chest: No respiratory distress.  Abdominal: Normal appearance. She exhibits no distension.  Musculoskeletal: Normal range of motion. She exhibits tenderness.       Back:  Neurological: She is alert and oriented to person, place, and time. No cranial nerve deficit.  Skin: Skin is warm and dry. No rash noted.  Psychiatric: She has a normal mood and affect. Her behavior is normal.  Nursing note and vitals reviewed.   ED Course  Procedures (including critical care time) Medications  ibuprofen (ADVIL,MOTRIN) tablet 600 mg (600 mg Oral  Given 01/12/15 1709)    Labs Review Labs Reviewed  BASIC METABOLIC PANEL - Abnormal; Notable for the following:    Glucose, Bld 103 (*)    Calcium 8.8 (*)    All other components within normal limits  CBC  I-STAT TROPOININ, ED    Imaging Review Dg Chest 2 View  01/12/2015   CLINICAL DATA:  69 year old female with chest tightness and pain in the left side of the back since this morning.  EXAM: CHEST  2 VIEW  COMPARISON:  No priors.  FINDINGS: Lung volumes are normal. No consolidative airspace disease. No pleural effusions. No pneumothorax. No pulmonary nodule or mass noted. Mild eventration of the right hemidiaphragm incidentally noted. Pulmonary vasculature and the cardiomediastinal silhouette are within normal limits. Atherosclerosis in the thoracic aorta. Surgical clips project over the right upper per quadrant of the abdomen, likely from prior cholecystectomy.  IMPRESSION: 1. No radiographic evidence of acute cardiopulmonary disease. 2. Atherosclerosis.   Electronically Signed   By: Vinnie Langton M.D.   On: 01/12/2015 15:29   Dg Thoracic Spine 2 View  01/12/2015   CLINICAL DATA:  69 year old female with several days of back pain, increasing. Mid chest pain radiating to the left side. Initial encounter.  EXAM: THORACIC SPINE 2 VIEWS  COMPARISON:  Chest radiographs from today reported separately.  FINDINGS: Mild dextro convex thoracic scoliosis. Thoracic disc space loss and endplate spurring from the T6 to the T10 level. Normal thoracic segmentation. No thoracic compression fracture identified. Visible upper lumbar levels also appear intact. Cervicothoracic junction alignment is within normal limits. Posterior ribs and visualized thoracic visceral contours appear grossly normal. Cholecystectomy clips.  IMPRESSION: No acute osseous abnormality identified in the thoracic spine.  If occult compression fracture is suspected, thoracic MRI or nuclear medicine whole-body bone scan would evaluate with  the highest sensitivity.   Electronically Signed   By: Genevie Ann M.D.   On: 01/12/2015 17:05   I, Kentley Cedillo L, personally reviewed and evaluated these images and lab results as part of my medical decision-making.   EKG Interpretation   Date/Time:  Saturday January 12 2015 14:59:18 EDT Ventricular Rate:  99 PR Interval:  164 QRS Duration: 84 QT Interval:  354 QTC Calculation: 454 R Axis:   24 Text Interpretation:  Normal sinus rhythm Inferior infarct , age  undetermined Cannot rule out Anterior infarct , age undetermined Abnormal  ECG Confirmed by Hilery Wintle  MD, Taylin Leder ([id number]) on 01/12/2015 5:45:22 PM     After treatment in the ED the patient feels back to baseline and wants to go home. MDM   Final diagnoses:  Thoracic back pain,  unspecified back pain laterality        Leonard Schwartz, MD 01/12/15 1820

## 2015-01-12 NOTE — ED Notes (Signed)
Pt. Is going to x-ray. 

## 2015-01-12 NOTE — ED Notes (Signed)
Pt reports having back pain for several days but more severe this am. Having a mid chest tightness and pain into left side of neck. Denies sob or n/v. ekg done at triage.

## 2015-01-12 NOTE — ED Notes (Signed)
Patient returned from X-ray 

## 2015-01-12 NOTE — Discharge Instructions (Signed)
Back Pain, Adult Low back pain is very common. About 1 in 5 people have back pain.The cause of low back pain is rarely dangerous. The pain often gets better over time.About half of people with a sudden onset of back pain feel better in just 2 weeks. About 8 in 10 people feel better by 6 weeks.  CAUSES Some common causes of back pain include:  Strain of the muscles or ligaments supporting the spine.  Wear and tear (degeneration) of the spinal discs.  Arthritis.  Direct injury to the back. DIAGNOSIS Most of the time, the direct cause of low back pain is not known.However, back pain can be treated effectively even when the exact cause of the pain is unknown.Answering your caregiver's questions about your overall health and symptoms is one of the most accurate ways to make sure the cause of your pain is not dangerous. If your caregiver needs more information, he or she may order lab work or imaging tests (X-rays or MRIs).However, even if imaging tests show changes in your back, this usually does not require surgery. HOME CARE INSTRUCTIONS For many people, back pain returns.Since low back pain is rarely dangerous, it is often a condition that people can learn to manageon their own.   Remain active. It is stressful on the back to sit or stand in one place. Do not sit, drive, or stand in one place for more than 30 minutes at a time. Take short walks on level surfaces as soon as pain allows.Try to increase the length of time you walk each day.  Do not stay in bed.Resting more than 1 or 2 days can delay your recovery.  Do not avoid exercise or work.Your body is made to move.It is not dangerous to be active, even though your back may hurt.Your back will likely heal faster if you return to being active before your pain is gone.  Pay attention to your body when you bend and lift. Many people have less discomfortwhen lifting if they bend their knees, keep the load close to their bodies,and  avoid twisting. Often, the most comfortable positions are those that put less stress on your recovering back.  Find a comfortable position to sleep. Use a firm mattress and lie on your side with your knees slightly bent. If you lie on your back, put a pillow under your knees.  Only take over-the-counter or prescription medicines as directed by your caregiver. Over-the-counter medicines to reduce pain and inflammation are often the most helpful.Your caregiver may prescribe muscle relaxant drugs.These medicines help dull your pain so you can more quickly return to your normal activities and healthy exercise.  Put ice on the injured area.  Put ice in a plastic bag.  Place a towel between your skin and the bag.  Leave the ice on for 15-20 minutes, 03-04 times a day for the first 2 to 3 days. After that, ice and heat may be alternated to reduce pain and spasms.  Ask your caregiver about trying back exercises and gentle massage. This may be of some benefit.  Avoid feeling anxious or stressed.Stress increases muscle tension and can worsen back pain.It is important to recognize when you are anxious or stressed and learn ways to manage it.Exercise is a great option. SEEK MEDICAL CARE IF:  You have pain that is not relieved with rest or medicine.  You have pain that does not improve in 1 week.  You have new symptoms.  You are generally not feeling well. SEEK   IMMEDIATE MEDICAL CARE IF:   You have pain that radiates from your back into your legs.  You develop new bowel or bladder control problems.  You have unusual weakness or numbness in your arms or legs.  You develop nausea or vomiting.  You develop abdominal pain.  You feel faint. Document Released: 05/18/2005 Document Revised: 11/17/2011 Document Reviewed: 09/19/2013 Exit[employer] Patient Information 2015 [employer]. This information is not intended to replace advice given to you by your health care provider. Make sure you  discuss any questions you have with your health care provider.  

## 2015-01-22 DIAGNOSIS — M1712 Unilateral primary osteoarthritis, left knee: Secondary | ICD-10-CM | POA: Diagnosis not present

## 2015-01-23 DIAGNOSIS — Z1389 Encounter for screening for other disorder: Secondary | ICD-10-CM | POA: Diagnosis not present

## 2015-01-23 DIAGNOSIS — I10 Essential (primary) hypertension: Secondary | ICD-10-CM | POA: Diagnosis not present

## 2015-01-23 DIAGNOSIS — Z6832 Body mass index (BMI) 32.0-32.9, adult: Secondary | ICD-10-CM | POA: Diagnosis not present

## 2015-01-23 DIAGNOSIS — E6609 Other obesity due to excess calories: Secondary | ICD-10-CM | POA: Diagnosis not present

## 2015-02-22 DIAGNOSIS — Z23 Encounter for immunization: Secondary | ICD-10-CM | POA: Diagnosis not present

## 2015-03-13 DIAGNOSIS — N390 Urinary tract infection, site not specified: Secondary | ICD-10-CM | POA: Diagnosis not present

## 2015-03-13 DIAGNOSIS — K5732 Diverticulitis of large intestine without perforation or abscess without bleeding: Secondary | ICD-10-CM | POA: Diagnosis not present

## 2015-04-01 DIAGNOSIS — Z1389 Encounter for screening for other disorder: Secondary | ICD-10-CM | POA: Diagnosis not present

## 2015-04-01 DIAGNOSIS — I10 Essential (primary) hypertension: Secondary | ICD-10-CM | POA: Diagnosis not present

## 2015-04-01 DIAGNOSIS — E782 Mixed hyperlipidemia: Secondary | ICD-10-CM | POA: Diagnosis not present

## 2015-04-01 DIAGNOSIS — R748 Abnormal levels of other serum enzymes: Secondary | ICD-10-CM | POA: Diagnosis not present

## 2015-04-02 DIAGNOSIS — M1712 Unilateral primary osteoarthritis, left knee: Secondary | ICD-10-CM | POA: Diagnosis not present

## 2015-04-05 DIAGNOSIS — I10 Essential (primary) hypertension: Secondary | ICD-10-CM | POA: Diagnosis not present

## 2015-04-05 DIAGNOSIS — Z1389 Encounter for screening for other disorder: Secondary | ICD-10-CM | POA: Diagnosis not present

## 2015-04-05 DIAGNOSIS — R7309 Other abnormal glucose: Secondary | ICD-10-CM | POA: Diagnosis not present

## 2015-04-05 DIAGNOSIS — E6609 Other obesity due to excess calories: Secondary | ICD-10-CM | POA: Diagnosis not present

## 2015-04-05 DIAGNOSIS — Z6831 Body mass index (BMI) 31.0-31.9, adult: Secondary | ICD-10-CM | POA: Diagnosis not present

## 2015-04-05 DIAGNOSIS — E782 Mixed hyperlipidemia: Secondary | ICD-10-CM | POA: Diagnosis not present

## 2015-04-05 DIAGNOSIS — Z01810 Encounter for preprocedural cardiovascular examination: Secondary | ICD-10-CM | POA: Diagnosis not present

## 2015-04-05 DIAGNOSIS — F419 Anxiety disorder, unspecified: Secondary | ICD-10-CM | POA: Diagnosis not present

## 2015-04-08 ENCOUNTER — Other Ambulatory Visit (HOSPITAL_COMMUNITY): Payer: Self-pay | Admitting: Family Medicine

## 2015-04-08 DIAGNOSIS — Z1231 Encounter for screening mammogram for malignant neoplasm of breast: Secondary | ICD-10-CM

## 2015-04-11 ENCOUNTER — Ambulatory Visit (HOSPITAL_COMMUNITY)
Admission: RE | Admit: 2015-04-11 | Discharge: 2015-04-11 | Disposition: A | Discharge status: Home / Self Care | Payer: Medicare Other | Source: Ambulatory Visit | Attending: Family Medicine | Admitting: Family Medicine

## 2015-04-11 DIAGNOSIS — Z1231 Encounter for screening mammogram for malignant neoplasm of breast: Secondary | ICD-10-CM

## 2015-05-21 ENCOUNTER — Encounter (HOSPITAL_COMMUNITY): Payer: Self-pay | Admitting: Physician Assistant

## 2015-05-21 DIAGNOSIS — M1712 Unilateral primary osteoarthritis, left knee: Secondary | ICD-10-CM | POA: Diagnosis not present

## 2015-05-21 DIAGNOSIS — I10 Essential (primary) hypertension: Secondary | ICD-10-CM | POA: Diagnosis present

## 2015-05-21 NOTE — H&P (Signed)
TOTAL KNEE ADMISSION H&P  Patient is being admitted for left total knee arthroplasty.  Subjective:  Chief Complaint:left knee pain.  HPI: [name], 69 y.o. female, has a history of pain and functional disability in the left knee due to arthritis and has failed non-surgical conservative treatments for greater than 12 weeks to includeNSAID's and/or analgesics, corticosteriod injections, viscosupplementation injections, flexibility and strengthening excercises, supervised PT with diminished ADL's post treatment, use of assistive devices, weight reduction as appropriate and activity modification.  Onset of symptoms was gradual, starting 10 years ago with gradually worsening course since that time. The patient noted prior procedures on the knee to include  arthroscopy and menisectomy on the left knee(s).  Patient currently rates pain in the left knee(s) at 10 out of 10 with activity. Patient has night pain, worsening of pain with activity and weight bearing, pain that interferes with activities of daily living, crepitus and joint swelling.  Patient has evidence of subchondral sclerosis, periarticular osteophytes and joint space narrowing by imaging studies.  There is no active infection.  Patient Active Problem List   Diagnosis Date Noted  . Primary localized osteoarthritis of left knee   . Hypertension   . GERD (gastroesophageal reflux disease) 09/11/2014  . High cholesterol 09/11/2014   Past Medical History  Diagnosis Date  . High cholesterol   . Hypertension   . GERD (gastroesophageal reflux disease)   . Arthritis   . Primary localized osteoarthritis of left knee     Past Surgical History  Procedure Laterality Date  . Cholecystectomy      pain  . Tubal ligation    . Ovary removed      for a cyst ? side  . Knee surgery Left     in the 90s.   . Knee arthroscopy w/ meniscectomy Left     No prescriptions prior to admission   Allergies  Allergen Reactions  . Cefazolin Rash     Social History  Substance Use Topics  . Smoking status: Never Smoker   . Smokeless tobacco: Not on file  . Alcohol Use: No    Family History  Problem Relation Age of Onset  . Heart disease Mother   . Diabetes Mellitus II Mother   . Kidney disease Mother   . Cancer Mother   . Hypertension Father   . Cancer Father   . Hypertension Brother   . Hypertension Sister      Review of Systems  Constitutional: Negative.   HENT: Negative.   Eyes: Negative.   Respiratory: Negative.   Cardiovascular: Negative.   Gastrointestinal: Negative.   Genitourinary: Negative.   Musculoskeletal: Positive for back pain and joint pain.       Left knee   Skin: Negative.   Neurological: Negative.   Endo/Heme/Allergies: Negative.   Psychiatric/Behavioral: Negative.     Objective:  Physical Exam  Constitutional: She is oriented to person, place, and time. She appears well-developed and well-nourished.  HENT:  Head: Normocephalic and atraumatic.  Mouth/Throat: Oropharynx is clear and moist.  Eyes: Conjunctivae are normal. Pupils are equal, round, and reactive to light.  Neck: Neck supple.  Cardiovascular: Normal rate and regular rhythm.   Respiratory: Effort normal and breath sounds normal.  GI: Soft. Bowel sounds are normal.  Genitourinary:  Not pertinent to current symptomatology therefore not examined.  Musculoskeletal:  xamination of her left knee reveals significant pain.  1+ effusion.  Moderate varus deformity.  Range of motion -5 to 100 degrees.  Knee is  stable with normal patella tracking.  Examination of the right knee reveals full range of motion without pain, swelling, weakness or instability.    Neurological: She is alert and oriented to person, place, and time.  Skin: Skin is warm and dry.  Psychiatric: She has a normal mood and affect. Her behavior is normal.    Vital signs in last 24 hours: Temp:  [97.3 F (36.3 C)] 97.3 F (36.3 C) (12/20 1000) Pulse Rate:  [72] 72  (12/20 1000) BP: (153)/(80) 153/80 mmHg (12/20 1000) SpO2:  [95 %] 95 % (12/20 1000) Weight:  [86.183 kg (190 lb)] 86.183 kg (190 lb) (12/20 1000)  Labs:   Estimated body mass index is 31.62 kg/(m^2) as calculated from the following:   Height as of this encounter: 5\' 5"  (1.651 m).   Weight as of this encounter: 86.183 kg (190 lb).   Imaging Review Plain radiographs demonstrate severe degenerative joint disease of the left knee(s). The overall alignment issignificant varus. The bone quality appears to be good for age and reported activity level.  Assessment/Plan:  End stage arthritis, left knee Principal Problem:   Primary localized osteoarthritis of left knee Active Problems:   GERD (gastroesophageal reflux disease)   High cholesterol   Hypertension    The patient history, physical examination, clinical judgment of the provider and imaging studies are consistent with end stage degenerative joint disease of the left knee(s) and total knee arthroplasty is deemed medically necessary. The treatment options including medical management, injection therapy arthroscopy and arthroplasty were discussed at length. The risks and benefits of total knee arthroplasty were presented and reviewed. The risks due to aseptic loosening, infection, stiffness, patella tracking problems, thromboembolic complications and other imponderables were discussed. The patient acknowledged the explanation, agreed to proceed with the plan and consent was signed. Patient is being admitted for inpatient treatment for surgery, pain control, PT, OT, prophylactic antibiotics, VTE prophylaxis, progressive ambulation and ADL's and discharge planning. The patient is planning to be discharged home with home health services  This patient was given scripts for oxycodone, miralax, colace, eliquis, and zofran and her preop visit with Oluwatosin Bracy so she could get them fill prior to surgery.

## 2015-05-22 DIAGNOSIS — M25562 Pain in left knee: Secondary | ICD-10-CM | POA: Diagnosis not present

## 2015-05-22 DIAGNOSIS — R262 Difficulty in walking, not elsewhere classified: Secondary | ICD-10-CM | POA: Diagnosis not present

## 2015-05-22 DIAGNOSIS — M1712 Unilateral primary osteoarthritis, left knee: Secondary | ICD-10-CM | POA: Diagnosis not present

## 2015-05-22 DIAGNOSIS — M25662 Stiffness of left knee, not elsewhere classified: Secondary | ICD-10-CM | POA: Diagnosis not present

## 2015-05-24 ENCOUNTER — Encounter (HOSPITAL_COMMUNITY): Payer: Self-pay

## 2015-05-24 ENCOUNTER — Encounter (HOSPITAL_COMMUNITY)
Admission: RE | Admit: 2015-05-24 | Discharge: 2015-05-24 | Disposition: A | Discharge status: Home / Self Care | Payer: Medicare Other | Source: Ambulatory Visit | Attending: Orthopedic Surgery | Admitting: Orthopedic Surgery

## 2015-05-24 DIAGNOSIS — M1712 Unilateral primary osteoarthritis, left knee: Secondary | ICD-10-CM | POA: Insufficient documentation

## 2015-05-24 DIAGNOSIS — Z01812 Encounter for preprocedural laboratory examination: Secondary | ICD-10-CM | POA: Diagnosis not present

## 2015-05-24 DIAGNOSIS — Z0183 Encounter for blood typing: Secondary | ICD-10-CM | POA: Insufficient documentation

## 2015-05-24 HISTORY — DX: Family history of other specified conditions: Z84.89

## 2015-05-24 LAB — COMPREHENSIVE METABOLIC PANEL
ALBUMIN: 4.1 g/dL (ref 3.5–5.0)
ALK PHOS: 84 U/L (ref 38–126)
ALT: 21 U/L (ref 14–54)
AST: 22 U/L (ref 15–41)
Anion gap: 10 (ref 5–15)
BILIRUBIN TOTAL: 0.7 mg/dL (ref 0.3–1.2)
BUN: 15 mg/dL (ref 6–20)
CALCIUM: 9.8 mg/dL (ref 8.9–10.3)
CO2: 27 mmol/L (ref 22–32)
CREATININE: 0.79 mg/dL (ref 0.44–1.00)
Chloride: 104 mmol/L (ref 101–111)
GFR calc Af Amer: >60 mL/min (ref >60)
GFR calc non Af Amer: >60 mL/min (ref >60)
GLUCOSE: 104 mg/dL — AB (ref 65–99)
Potassium: 4 mmol/L (ref 3.5–5.1)
SODIUM: 141 mmol/L (ref 135–145)
TOTAL PROTEIN: 7.7 g/dL (ref 6.5–8.1)

## 2015-05-24 LAB — CBC WITH DIFFERENTIAL/PLATELET
BASOS PCT: 1 %
Basophils Absolute: 0.1 10*3/uL (ref 0.0–0.1)
EOS ABS: 0.3 10*3/uL (ref 0.0–0.7)
Eosinophils Relative: 4 %
HEMATOCRIT: 42.1 % (ref 36.0–46.0)
HEMOGLOBIN: 14.2 g/dL (ref 12.0–15.0)
Lymphocytes Relative: 35 %
Lymphs Abs: 2.9 10*3/uL (ref 0.7–4.0)
MCH: 29.8 pg (ref 26.0–34.0)
MCHC: 33.7 g/dL (ref 30.0–36.0)
MCV: 88.4 fL (ref 78.0–100.0)
MONOS PCT: 8 %
Monocytes Absolute: 0.6 10*3/uL (ref 0.1–1.0)
NEUTROS ABS: 4.4 10*3/uL (ref 1.7–7.7)
NEUTROS PCT: 52 %
Platelets: 278 10*3/uL (ref 150–400)
RBC: 4.76 MIL/uL (ref 3.87–5.11)
RDW: 12.8 % (ref 11.5–15.5)
WBC: 8.3 10*3/uL (ref 4.0–10.5)

## 2015-05-24 LAB — ABO/RH: ABO/RH(D): A POS

## 2015-05-24 LAB — PROTIME-INR
INR: 1.03 (ref 0.00–1.49)
Prothrombin Time: 13.7 seconds (ref 11.6–15.2)

## 2015-05-24 LAB — TYPE AND SCREEN
ABO/RH(D): A POS
Antibody Screen: NEGATIVE

## 2015-05-24 LAB — SURGICAL PCR SCREEN
MRSA, PCR: NEGATIVE
STAPHYLOCOCCUS AUREUS: NEGATIVE

## 2015-05-24 LAB — APTT: aPTT: 27 seconds (ref 24–37)

## 2015-05-24 NOTE — Pre-Procedure Instructions (Signed)
Lucina Krynski  05/24/2015     Your procedure is scheduled on : Monday June 03, 2015 at 7:15 AM.  Report to [facility] Admitting at 5:30 AM.  Call this number if you have problems the morning of surgery: [phone]    Remember:  Do not eat food or drink liquids after midnight.  Take these medicines the morning of surgery with A SIP OF WATER : Alprazolam (Xanax) if needed, Pantoprazole (Protonix)   Stop taking any aspirin, vitamins, herbal medications, NSAID's, CoQ10, Dicolfenac/Voltaren, Ibuprofen, Advil, Motrin, Aleve, Krill Oil, etc on Monday December 26th   Do not wear jewelry, make-up or nail polish.  Do not wear lotions, powders, or perfumes.    Do not shave 48 hours prior to surgery.    Do not bring valuables to the hospital.  [facility] is not responsible for any belongings or valuables.  Contacts, dentures or bridgework may not be worn into surgery.  Leave your suitcase in the car.  After surgery it may be brought to your room.  For patients admitted to the hospital, discharge time will be determined by your treatment team.  Patients discharged the day of surgery will not be allowed to drive home.   Name and phone number of your driver:    Special instructions:  Shower using CHG soap the night before and the morning of your surgery  Please read over the following fact sheets that you were given. Pain Booklet, Coughing and Deep Breathing, Blood Transfusion Information, Total Joint Packet, MRSA Information and Surgical Site Infection Prevention

## 2015-05-24 NOTE — Progress Notes (Signed)
PCP is Sharilyn Sites  Patient denied having any acute cardiac or pulmonary issues  Patient informed Nurse that she had a stress test "a while ago" (patient unsure of time) because after extending her left arm, there was some pain and she had a stress test to rule out any cardiac issues. Patient stated "everything was fine."

## 2015-05-25 LAB — URINE CULTURE

## 2015-05-31 MED ORDER — POVIDONE-IODINE 7.5 % EX SOLN
Freq: Once | CUTANEOUS | Status: DC
  Filled 2015-05-31: qty 118

## 2015-05-31 MED ORDER — LACTATED RINGERS IV SOLN
INTRAVENOUS | Status: DC
  Administered 2015-06-03 (×3): via INTRAVENOUS

## 2015-05-31 MED ORDER — CHLORHEXIDINE GLUCONATE 4 % EX LIQD: 60.0000 mL | Freq: Once | CUTANEOUS | Status: DC

## 2015-05-31 MED ORDER — VANCOMYCIN HCL IN DEXTROSE 1-5 GM/200ML-% IV SOLN
1000.0000 mg | INTRAVENOUS | Status: AC
  Administered 2015-06-03: 1000 mg via INTRAVENOUS
  Filled 2015-05-31: qty 200

## 2015-06-02 NOTE — Anesthesia Preprocedure Evaluation (Addendum)
Anesthesia Evaluation  Patient identified by MRN, date of birth, ID band Patient awake    Reviewed: Allergy & Precautions, H&P , NPO status , Patient's Chart, lab work & pertinent test results  History of Anesthesia Complications Negative for: history of anesthetic complications  Airway Mallampati: II  TM Distance: >3 FB Neck ROM: full    Dental no notable dental hx.    Pulmonary neg pulmonary ROS,    Pulmonary exam normal breath sounds clear to auscultation       Cardiovascular hypertension, Pt. on medications Normal cardiovascular exam Rhythm:regular Rate:Normal     Neuro/Psych negative neurological ROS     GI/Hepatic Neg liver ROS, GERD  ,  Endo/Other  negative endocrine ROS  Renal/GU negative Renal ROS     Musculoskeletal  (+) Arthritis ,   Abdominal   Peds  Hematology negative hematology ROS (+)   Anesthesia Other Findings No anticoagulants, NPO appropriate  Reproductive/Obstetrics negative OB ROS                            Anesthesia Physical Anesthesia Plan  ASA: III  Anesthesia Plan: Regional and Spinal   Post-op Pain Management: GA combined w/ Regional for post-op pain   Induction: Intravenous  Airway Management Planned: Simple Face Mask  Additional Equipment: None  Intra-op Plan:   Post-operative Plan: Extubation in OR  Informed Consent: I have reviewed the patients History and Physical, chart, labs and discussed the procedure including the risks, benefits and alternatives for the proposed anesthesia with the patient or authorized representative who has indicated his/her understanding and acceptance.   Dental Advisory Given  Plan Discussed with: Anesthesiologist, CRNA and Surgeon  Anesthesia Plan Comments:        Anesthesia Quick Evaluation

## 2015-06-03 ENCOUNTER — Encounter (HOSPITAL_COMMUNITY): Payer: Self-pay | Admitting: General Practice

## 2015-06-03 ENCOUNTER — Inpatient Hospital Stay (HOSPITAL_COMMUNITY)
Admission: RE | Admit: 2015-06-03 | Discharge: 2015-06-04 | DRG: 470 | Disposition: A | Discharge status: Home Health | Payer: Medicare Other | Source: Ambulatory Visit | Attending: Orthopedic Surgery | Admitting: Orthopedic Surgery

## 2015-06-03 ENCOUNTER — Encounter (HOSPITAL_COMMUNITY)
Admission: RE | Disposition: A | Discharge status: Home Health | Payer: Self-pay | Source: Ambulatory Visit | Attending: Orthopedic Surgery

## 2015-06-03 ENCOUNTER — Inpatient Hospital Stay (HOSPITAL_COMMUNITY): Payer: Medicare Other | Admitting: Anesthesiology

## 2015-06-03 DIAGNOSIS — M179 Osteoarthritis of knee, unspecified: Secondary | ICD-10-CM | POA: Diagnosis present

## 2015-06-03 DIAGNOSIS — G8918 Other acute postprocedural pain: Secondary | ICD-10-CM | POA: Diagnosis not present

## 2015-06-03 DIAGNOSIS — M1712 Unilateral primary osteoarthritis, left knee: Secondary | ICD-10-CM | POA: Diagnosis not present

## 2015-06-03 DIAGNOSIS — M25762 Osteophyte, left knee: Secondary | ICD-10-CM | POA: Diagnosis present

## 2015-06-03 DIAGNOSIS — I10 Essential (primary) hypertension: Secondary | ICD-10-CM | POA: Diagnosis present

## 2015-06-03 DIAGNOSIS — E7800 Pure hypercholesterolemia, unspecified: Secondary | ICD-10-CM | POA: Diagnosis present

## 2015-06-03 DIAGNOSIS — K219 Gastro-esophageal reflux disease without esophagitis: Secondary | ICD-10-CM | POA: Diagnosis present

## 2015-06-03 DIAGNOSIS — D62 Acute posthemorrhagic anemia: Secondary | ICD-10-CM | POA: Diagnosis not present

## 2015-06-03 DIAGNOSIS — M1710 Unilateral primary osteoarthritis, unspecified knee: Secondary | ICD-10-CM | POA: Diagnosis present

## 2015-06-03 HISTORY — PX: TOTAL KNEE ARTHROPLASTY: SHX125

## 2015-06-03 HISTORY — DX: Anxiety disorder, unspecified: F41.9

## 2015-06-03 HISTORY — DX: Unilateral primary osteoarthritis, left knee: M17.12

## 2015-06-03 SURGERY — ARTHROPLASTY, KNEE, TOTAL
Anesthesia: Regional | Site: Knee | Laterality: Left

## 2015-06-03 MED ORDER — OXYCODONE HCL 5 MG PO TABS: 5.0000 mg | ORAL_TABLET | Freq: Once | ORAL | Status: DC | PRN

## 2015-06-03 MED ORDER — ONDANSETRON HCL 4 MG/2ML IJ SOLN
INTRAMUSCULAR | Status: AC
  Filled 2015-06-03: qty 2

## 2015-06-03 MED ORDER — DEXAMETHASONE SODIUM PHOSPHATE 10 MG/ML IJ SOLN
INTRAMUSCULAR | Status: DC | PRN
  Administered 2015-06-03: 10 mg via INTRAVENOUS

## 2015-06-03 MED ORDER — 0.9 % SODIUM CHLORIDE (POUR BTL) OPTIME
TOPICAL | Status: DC | PRN
  Administered 2015-06-03: 1000 mL

## 2015-06-03 MED ORDER — LOSARTAN POTASSIUM-HCTZ 50-12.5 MG PO TABS: 1.0000 | ORAL_TABLET | Freq: Every day | ORAL | Status: DC

## 2015-06-03 MED ORDER — CELECOXIB 200 MG PO CAPS
200.0000 mg | ORAL_CAPSULE | Freq: Two times a day (BID) | ORAL | Status: DC
  Administered 2015-06-03 – 2015-06-04 (×3): 200 mg via ORAL
  Filled 2015-06-03 (×3): qty 1

## 2015-06-03 MED ORDER — SUCRALFATE 1 G PO TABS: 1.0000 g | ORAL_TABLET | Freq: Four times a day (QID) | ORAL | Status: DC | PRN

## 2015-06-03 MED ORDER — ACETAMINOPHEN 325 MG PO TABS: 650.0000 mg | ORAL_TABLET | Freq: Four times a day (QID) | ORAL | Status: DC | PRN

## 2015-06-03 MED ORDER — ALPRAZOLAM 0.5 MG PO TABS: 0.5000 mg | ORAL_TABLET | Freq: Two times a day (BID) | ORAL | Status: DC | PRN

## 2015-06-03 MED ORDER — APIXABAN 2.5 MG PO TABS
2.5000 mg | ORAL_TABLET | Freq: Two times a day (BID) | ORAL | Status: DC
  Administered 2015-06-04: 2.5 mg via ORAL
  Filled 2015-06-03: qty 1

## 2015-06-03 MED ORDER — SODIUM CHLORIDE 0.9 % IJ SOLN
INTRAMUSCULAR | Status: AC
  Filled 2015-06-03: qty 10

## 2015-06-03 MED ORDER — FENTANYL CITRATE (PF) 250 MCG/5ML IJ SOLN
INTRAMUSCULAR | Status: AC
  Filled 2015-06-03: qty 5

## 2015-06-03 MED ORDER — EPHEDRINE SULFATE 50 MG/ML IJ SOLN
INTRAMUSCULAR | Status: AC
  Filled 2015-06-03: qty 1

## 2015-06-03 MED ORDER — ALUM & MAG HYDROXIDE-SIMETH 200-200-20 MG/5ML PO SUSP: 30.0000 mL | ORAL | Status: DC | PRN

## 2015-06-03 MED ORDER — FENTANYL CITRATE (PF) 100 MCG/2ML IJ SOLN
INTRAMUSCULAR | Status: DC | PRN
  Administered 2015-06-03: 25 ug via INTRAVENOUS
  Administered 2015-06-03 (×3): 50 ug via INTRAVENOUS
  Administered 2015-06-03: 25 ug via INTRAVENOUS
  Administered 2015-06-03: 50 ug via INTRAVENOUS

## 2015-06-03 MED ORDER — ATORVASTATIN CALCIUM 10 MG PO TABS
10.0000 mg | ORAL_TABLET | Freq: Every day | ORAL | Status: DC
  Administered 2015-06-03 – 2015-06-04 (×2): 10 mg via ORAL
  Filled 2015-06-03 (×2): qty 1

## 2015-06-03 MED ORDER — BISACODYL 10 MG RE SUPP
10.0000 mg | Freq: Every day | RECTAL | Status: DC | PRN
Start: 2015-06-03 — End: 2015-06-04

## 2015-06-03 MED ORDER — SUCCINYLCHOLINE CHLORIDE 20 MG/ML IJ SOLN
INTRAMUSCULAR | Status: AC
  Filled 2015-06-03: qty 1

## 2015-06-03 MED ORDER — LOSARTAN POTASSIUM 50 MG PO TABS
50.0000 mg | ORAL_TABLET | Freq: Every day | ORAL | Status: DC
  Administered 2015-06-04: 50 mg via ORAL
  Filled 2015-06-03: qty 1

## 2015-06-03 MED ORDER — ONDANSETRON HCL 4 MG/2ML IJ SOLN
4.0000 mg | Freq: Four times a day (QID) | INTRAMUSCULAR | Status: DC | PRN
  Administered 2015-06-03: 4 mg via INTRAVENOUS
  Filled 2015-06-03: qty 2

## 2015-06-03 MED ORDER — POLYETHYLENE GLYCOL 3350 17 G PO PACK: 17.0000 g | PACK | Freq: Every day | ORAL | Status: DC | PRN

## 2015-06-03 MED ORDER — BUPIVACAINE HCL (PF) 0.5 % IJ SOLN
INTRAMUSCULAR | Status: DC | PRN
  Administered 2015-06-03: 2.8 mL via INTRATHECAL

## 2015-06-03 MED ORDER — ACETAMINOPHEN 650 MG RE SUPP: 650.0000 mg | Freq: Four times a day (QID) | RECTAL | Status: DC | PRN

## 2015-06-03 MED ORDER — PANTOPRAZOLE SODIUM 40 MG PO TBEC
40.0000 mg | DELAYED_RELEASE_TABLET | Freq: Every day | ORAL | Status: DC
  Administered 2015-06-03 – 2015-06-04 (×2): 40 mg via ORAL
  Filled 2015-06-03 (×2): qty 1

## 2015-06-03 MED ORDER — PHENOL 1.4 % MT LIQD: 1.0000 | OROMUCOSAL | Status: DC | PRN

## 2015-06-03 MED ORDER — HYDROCHLOROTHIAZIDE 12.5 MG PO CAPS
12.5000 mg | ORAL_CAPSULE | Freq: Every day | ORAL | Status: DC
  Administered 2015-06-04: 12.5 mg via ORAL
  Filled 2015-06-03: qty 1

## 2015-06-03 MED ORDER — ROCURONIUM BROMIDE 50 MG/5ML IV SOLN
INTRAVENOUS | Status: AC
  Filled 2015-06-03: qty 1

## 2015-06-03 MED ORDER — BUPIVACAINE-EPINEPHRINE (PF) 0.5% -1:200000 IJ SOLN
INTRAMUSCULAR | Status: DC | PRN
  Administered 2015-06-03: 25 mL via PERINEURAL

## 2015-06-03 MED ORDER — PROPOFOL 10 MG/ML IV BOLUS
INTRAVENOUS | Status: DC | PRN
  Administered 2015-06-03: 20 mg via INTRAVENOUS

## 2015-06-03 MED ORDER — OXYCODONE HCL 5 MG/5ML PO SOLN
5.0000 mg | Freq: Once | ORAL | Status: DC | PRN
Start: 2015-06-03 — End: 2015-06-03

## 2015-06-03 MED ORDER — METOCLOPRAMIDE HCL 5 MG PO TABS: 5.0000 mg | ORAL_TABLET | Freq: Three times a day (TID) | ORAL | Status: DC | PRN

## 2015-06-03 MED ORDER — BUPIVACAINE-EPINEPHRINE 0.25% -1:200000 IJ SOLN
INTRAMUSCULAR | Status: DC | PRN
  Administered 2015-06-03: 30 mL

## 2015-06-03 MED ORDER — BUPIVACAINE-EPINEPHRINE (PF) 0.25% -1:200000 IJ SOLN
INTRAMUSCULAR | Status: AC
  Filled 2015-06-03: qty 30

## 2015-06-03 MED ORDER — OXYCODONE HCL 5 MG PO TABS: ORAL_TABLET | ORAL | Status: DC

## 2015-06-03 MED ORDER — MAGNESIUM CITRATE PO SOLN: 1.0000 | Freq: Once | ORAL | Status: DC | PRN

## 2015-06-03 MED ORDER — MIDAZOLAM HCL 2 MG/2ML IJ SOLN
INTRAMUSCULAR | Status: AC
  Filled 2015-06-03: qty 2

## 2015-06-03 MED ORDER — DIPHENHYDRAMINE HCL 12.5 MG/5ML PO ELIX: 12.5000 mg | ORAL_SOLUTION | ORAL | Status: DC | PRN

## 2015-06-03 MED ORDER — HYDROMORPHONE HCL 1 MG/ML IJ SOLN
0.2500 mg | INTRAMUSCULAR | Status: DC | PRN
  Administered 2015-06-03: 0.5 mg via INTRAVENOUS

## 2015-06-03 MED ORDER — BISACODYL 5 MG PO TBEC: 5.0000 mg | DELAYED_RELEASE_TABLET | Freq: Every day | ORAL | Status: DC | PRN

## 2015-06-03 MED ORDER — MENTHOL 3 MG MT LOZG: 1.0000 | LOZENGE | OROMUCOSAL | Status: DC | PRN

## 2015-06-03 MED ORDER — SODIUM CHLORIDE 0.9 % IR SOLN
Status: DC | PRN
  Administered 2015-06-03: 3000 mL

## 2015-06-03 MED ORDER — OXYCODONE HCL 5 MG PO TABS
5.0000 mg | ORAL_TABLET | ORAL | Status: DC | PRN
  Administered 2015-06-03 – 2015-06-04 (×7): 10 mg via ORAL
  Filled 2015-06-03 (×7): qty 2

## 2015-06-03 MED ORDER — HYDROMORPHONE HCL 1 MG/ML IJ SOLN: 0.5000 mg | INTRAMUSCULAR | Status: DC | PRN

## 2015-06-03 MED ORDER — MONTELUKAST SODIUM 10 MG PO TABS
10.0000 mg | ORAL_TABLET | Freq: Every day | ORAL | Status: DC
  Administered 2015-06-03: 10 mg via ORAL
  Filled 2015-06-03: qty 1

## 2015-06-03 MED ORDER — MIDAZOLAM HCL 5 MG/5ML IJ SOLN
INTRAMUSCULAR | Status: DC | PRN
  Administered 2015-06-03 (×2): 1 mg via INTRAVENOUS

## 2015-06-03 MED ORDER — PROPOFOL 10 MG/ML IV BOLUS
INTRAVENOUS | Status: AC
  Filled 2015-06-03: qty 40

## 2015-06-03 MED ORDER — DEXAMETHASONE SODIUM PHOSPHATE 10 MG/ML IJ SOLN
INTRAMUSCULAR | Status: AC
Start: 2015-06-03 — End: 2015-06-03
  Filled 2015-06-03: qty 1

## 2015-06-03 MED ORDER — DOCUSATE SODIUM 100 MG PO CAPS
100.0000 mg | ORAL_CAPSULE | Freq: Two times a day (BID) | ORAL | Status: DC
  Administered 2015-06-03 – 2015-06-04 (×2): 100 mg via ORAL
  Filled 2015-06-03 (×2): qty 1

## 2015-06-03 MED ORDER — HYDROMORPHONE HCL 1 MG/ML IJ SOLN
INTRAMUSCULAR | Status: AC
  Administered 2015-06-03: 0.5 mg via INTRAVENOUS
  Filled 2015-06-03: qty 1

## 2015-06-03 MED ORDER — POTASSIUM CHLORIDE IN NACL 20-0.9 MEQ/L-% IV SOLN
INTRAVENOUS | Status: DC
  Administered 2015-06-03: 13:00:00 via INTRAVENOUS
  Filled 2015-06-03 (×2): qty 1000

## 2015-06-03 MED ORDER — ONDANSETRON HCL 4 MG/2ML IJ SOLN
INTRAMUSCULAR | Status: DC | PRN
  Administered 2015-06-03: 4 mg via INTRAVENOUS

## 2015-06-03 MED ORDER — ONDANSETRON HCL 4 MG PO TABS: 4.0000 mg | ORAL_TABLET | Freq: Three times a day (TID) | ORAL | Status: DC | PRN

## 2015-06-03 MED ORDER — METOCLOPRAMIDE HCL 5 MG/ML IJ SOLN: 5.0000 mg | Freq: Three times a day (TID) | INTRAMUSCULAR | Status: DC | PRN

## 2015-06-03 MED ORDER — DEXAMETHASONE SODIUM PHOSPHATE 10 MG/ML IJ SOLN
10.0000 mg | Freq: Once | INTRAMUSCULAR | Status: AC
  Administered 2015-06-04: 10 mg via INTRAVENOUS
  Filled 2015-06-03: qty 1

## 2015-06-03 MED ORDER — PROPOFOL 500 MG/50ML IV EMUL
INTRAVENOUS | Status: DC | PRN
  Administered 2015-06-03: 50 ug/kg/min via INTRAVENOUS

## 2015-06-03 MED ORDER — APIXABAN 2.5 MG PO TABS: ORAL_TABLET | ORAL | Status: DC

## 2015-06-03 MED ORDER — ONDANSETRON HCL 4 MG/2ML IJ SOLN: 4.0000 mg | Freq: Once | INTRAMUSCULAR | Status: DC | PRN

## 2015-06-03 MED ORDER — ZOLPIDEM TARTRATE 5 MG PO TABS: 5.0000 mg | ORAL_TABLET | Freq: Every evening | ORAL | Status: DC | PRN

## 2015-06-03 MED ORDER — EPHEDRINE SULFATE 50 MG/ML IJ SOLN
INTRAMUSCULAR | Status: DC | PRN
  Administered 2015-06-03: 10 mg via INTRAVENOUS

## 2015-06-03 MED ORDER — PROPOFOL 10 MG/ML IV BOLUS
INTRAVENOUS | Status: AC
  Filled 2015-06-03: qty 20

## 2015-06-03 MED ORDER — ONDANSETRON HCL 4 MG PO TABS: 4.0000 mg | ORAL_TABLET | Freq: Four times a day (QID) | ORAL | Status: DC | PRN

## 2015-06-03 MED ORDER — VANCOMYCIN HCL IN DEXTROSE 1-5 GM/200ML-% IV SOLN
1000.0000 mg | Freq: Two times a day (BID) | INTRAVENOUS | Status: AC
  Administered 2015-06-03: 1000 mg via INTRAVENOUS
  Filled 2015-06-03: qty 200

## 2015-06-03 SURGICAL SUPPLY — 75 items
APL SKNCLS STERI-STRIP NONHPOA (GAUZE/BANDAGES/DRESSINGS) ×1
BANDAGE ESMARK 6X9 LF (GAUZE/BANDAGES/DRESSINGS) ×1 IMPLANT
BENZOIN TINCTURE PRP APPL 2/3 (GAUZE/BANDAGES/DRESSINGS) ×3 IMPLANT
BLADE SAGITTAL 25.0X1.19X90 (BLADE) ×2 IMPLANT
BLADE SAGITTAL 25.0X1.19X90MM (BLADE) ×1
BLADE SAW RECIP 87.9 MT (BLADE) ×2 IMPLANT
BLADE SAW SAG 90X13X1.27 (BLADE) ×3 IMPLANT
BLADE SURG 10 STRL SS (BLADE) ×6 IMPLANT
BNDG CMPR 9X6 STRL LF SNTH (GAUZE/BANDAGES/DRESSINGS) ×1
BNDG CMPR MED 15X6 ELC VLCR LF (GAUZE/BANDAGES/DRESSINGS) ×1
BNDG ELASTIC 6X15 VLCR STRL LF (GAUZE/BANDAGES/DRESSINGS) ×3 IMPLANT
BNDG ESMARK 6X9 LF (GAUZE/BANDAGES/DRESSINGS) ×3
BOWL SMART MIX CTS (DISPOSABLE) ×3 IMPLANT
CAPT KNEE TOTAL 3 ATTUNE ×2 IMPLANT
CEMENT HV SMART SET (Cement) ×6 IMPLANT
CLOSURE WOUND 1/2 X4 (GAUZE/BANDAGES/DRESSINGS) ×2
COVER SURGICAL LIGHT HANDLE (MISCELLANEOUS) ×3 IMPLANT
CUFF TOURNIQUET SINGLE 34IN LL (TOURNIQUET CUFF) ×3 IMPLANT
CUFF TOURNIQUET SINGLE 44IN (TOURNIQUET CUFF) IMPLANT
DECANTER SPIKE VIAL GLASS SM (MISCELLANEOUS) ×1 IMPLANT
DRAPE EXTREMITY T 121X128X90 (DRAPE) ×3 IMPLANT
DRAPE INCISE IOBAN 66X45 STRL (DRAPES) ×3 IMPLANT
DRAPE PROXIMA HALF (DRAPES) ×3 IMPLANT
DRAPE U-SHAPE 47X51 STRL (DRAPES) ×3 IMPLANT
DRSG AQUACEL AG ADV 3.5X10 (GAUZE/BANDAGES/DRESSINGS) ×1 IMPLANT
DRSG AQUACEL AG ADV 3.5X14 (GAUZE/BANDAGES/DRESSINGS) ×3 IMPLANT
DURAPREP 26ML APPLICATOR (WOUND CARE) ×4 IMPLANT
ELECT CAUTERY BLADE 6.4 (BLADE) ×3 IMPLANT
ELECT REM PT RETURN 9FT ADLT (ELECTROSURGICAL) ×3
ELECTRODE REM PT RTRN 9FT ADLT (ELECTROSURGICAL) ×1 IMPLANT
FACESHIELD WRAPAROUND (MASK) ×3 IMPLANT
FACESHIELD WRAPAROUND OR TEAM (MASK) ×1 IMPLANT
GAUZE SPONGE 4X4 12PLY STRL (GAUZE/BANDAGES/DRESSINGS) ×1 IMPLANT
GLOVE BIO SURGEON STRL SZ7 (GLOVE) ×3 IMPLANT
GLOVE BIOGEL PI IND STRL 7.0 (GLOVE) ×1 IMPLANT
GLOVE BIOGEL PI IND STRL 7.5 (GLOVE) ×1 IMPLANT
GLOVE BIOGEL PI INDICATOR 7.0 (GLOVE) ×2
GLOVE BIOGEL PI INDICATOR 7.5 (GLOVE) ×2
GLOVE SS BIOGEL STRL SZ 7.5 (GLOVE) ×1 IMPLANT
GLOVE SUPERSENSE BIOGEL SZ 7.5 (GLOVE) ×2
GOWN STRL REUS W/ TWL LRG LVL3 (GOWN DISPOSABLE) ×1 IMPLANT
GOWN STRL REUS W/ TWL XL LVL3 (GOWN DISPOSABLE) ×2 IMPLANT
GOWN STRL REUS W/TWL LRG LVL3 (GOWN DISPOSABLE) ×3
GOWN STRL REUS W/TWL XL LVL3 (GOWN DISPOSABLE) ×6
HANDPIECE INTERPULSE COAX TIP (DISPOSABLE) ×3
HOOD PEEL AWAY FACE SHEILD DIS (HOOD) ×6 IMPLANT
IMMOBILIZER KNEE 22 UNIV (SOFTGOODS) ×3 IMPLANT
KIT BASIN OR (CUSTOM PROCEDURE TRAY) ×3 IMPLANT
KIT ROOM TURNOVER OR (KITS) ×3 IMPLANT
MANIFOLD NEPTUNE II (INSTRUMENTS) ×3 IMPLANT
MARKER SKIN DUAL TIP RULER LAB (MISCELLANEOUS) ×3 IMPLANT
NDL 18GX1X1/2 (RX/OR ONLY) (NEEDLE) ×1 IMPLANT
NEEDLE 18GX1X1/2 (RX/OR ONLY) (NEEDLE) ×3 IMPLANT
NS IRRIG 1000ML POUR BTL (IV SOLUTION) ×3 IMPLANT
PACK TOTAL JOINT (CUSTOM PROCEDURE TRAY) ×3 IMPLANT
PACK UNIVERSAL I (CUSTOM PROCEDURE TRAY) ×3 IMPLANT
PAD ARMBOARD 7.5X6 YLW CONV (MISCELLANEOUS) ×6 IMPLANT
SET HNDPC FAN SPRY TIP SCT (DISPOSABLE) ×1 IMPLANT
STRIP CLOSURE SKIN 1/2X4 (GAUZE/BANDAGES/DRESSINGS) ×3 IMPLANT
SUCTION FRAZIER TIP 10 FR DISP (SUCTIONS) ×3 IMPLANT
SUT ETHIBOND NAB CT1 #1 30IN (SUTURE) ×4 IMPLANT
SUT MNCRL AB 3-0 PS2 18 (SUTURE) ×3 IMPLANT
SUT VIC AB 0 CT1 27 (SUTURE) ×6
SUT VIC AB 0 CT1 27XBRD ANBCTR (SUTURE) ×2 IMPLANT
SUT VIC AB 1 CT1 27 (SUTURE) ×3
SUT VIC AB 1 CT1 27XBRD ANBCTR (SUTURE) ×1 IMPLANT
SUT VIC AB 2-0 CT1 27 (SUTURE) ×12
SUT VIC AB 2-0 CT1 TAPERPNT 27 (SUTURE) ×2 IMPLANT
SYR 30ML SLIP (SYRINGE) ×3 IMPLANT
TOWEL OR 17X24 6PK STRL BLUE (TOWEL DISPOSABLE) ×3 IMPLANT
TOWEL OR 17X26 10 PK STRL BLUE (TOWEL DISPOSABLE) ×3 IMPLANT
TRAY FOLEY CATH 16FR SILVER (SET/KITS/TRAYS/PACK) ×3 IMPLANT
TUBE CONNECTING 12'X1/4 (SUCTIONS) ×1
TUBE CONNECTING 12X1/4 (SUCTIONS) ×2 IMPLANT
YANKAUER SUCT BULB TIP NO VENT (SUCTIONS) ×3 IMPLANT

## 2015-06-03 NOTE — Evaluation (Signed)
Physical Therapy Evaluation Patient Details Name: Nyashia Hirschy MRN: [id number] DOB: 05-28-1946 Today's Date: 06/03/2015   History of Present Illness  Admitted for LTKA  Clinical Impression  Pt is s/p TKA resulting in the deficits listed below (see PT Problem List).  Pt will benefit from skilled PT to increase their independence and safety with mobility to allow discharge to the venue listed below.      Follow Up Recommendations Home health PT;Supervision - Intermittent    Equipment Recommendations  Rolling walker with 5" wheels;3in1 (PT)    Recommendations for Other Services OT consult     Precautions / Restrictions Precautions Precautions: Knee Precaution Booklet Issued: Yes (comment) Precaution Comments: Pt educated to not allow any pillow or bolster under knee for healing with optimal range of motion.  Required Braces or Orthoses: Knee Immobilizer - Left Knee Immobilizer - Left: On when out of bed or walking Restrictions Weight Bearing Restrictions: Yes LLE Weight Bearing: Weight bearing as tolerated      Mobility  Bed Mobility Overal bed mobility: Needs Assistance Bed Mobility: Supine to Sit     Supine to sit: Min guard     General bed mobility comments: Cues for technique  Transfers Overall transfer level: Needs assistance Equipment used: Rolling walker (2 wheeled) Transfers: Sit to/from Stand Sit to Stand: Min assist         General transfer comment: Min assist to steady; cues for hand placement and safety; Cues also to self-monitor for activity tolerance  Ambulation/Gait Ambulation/Gait assistance: Min assist Ambulation Distance (Feet):  (pivot steps bed to recliner) Assistive device: Rolling walker (2 wheeled) Gait Pattern/deviations: Shuffle     General Gait Details: Good steps to chair, no gross L knee buckling  Stairs            Wheelchair Mobility    Modified Rankin (Stroke Patients Only)       Balance Overall balance assessment:  Needs assistance         Standing balance support: Bilateral upper extremity supported Standing balance-Leahy Scale: Poor (approaching Fair)                               Pertinent Vitals/Pain Pain Assessment: 0-10 Pain Score: 7  Pain Location: L knee Pain Descriptors / Indicators: Aching Pain Intervention(s): Limited activity within patient's tolerance;Monitored during session;Premedicated before session    Home Living Family/patient expects to be discharged to:: Private residence Living Arrangements: Spouse/significant other Available Help at Discharge: Family;Available 24 hours/day Type of Home: Mobile home Home Access: Ramped entrance;Stairs to enter Entrance Stairs-Rails: Right;Left Entrance Stairs-Number of Steps: 4 Home Layout: One level Home Equipment: None      Prior Function Level of Independence: Independent               Hand Dominance        Extremity/Trunk Assessment   Upper Extremity Assessment: Overall WFL for tasks assessed           Lower Extremity Assessment: LLE deficits/detail   LLE Deficits / Details: Noted good quad set and able to straight leg raise against gravity with minimal lag     Communication   Communication: No difficulties  Cognition Arousal/Alertness: Awake/alert Behavior During Therapy: WFL for tasks assessed/performed Overall Cognitive Status: Within Functional Limits for tasks assessed                      General Comments General comments (skin  integrity, edema, etc.): Noted incr sanguinous drainage inferolateral L knee; RN reinforced dressing    Exercises        Assessment/Plan    PT Assessment Patient needs continued PT services  PT Diagnosis Acute pain;Difficulty walking   PT Problem List Decreased strength;Decreased range of motion;Decreased activity tolerance;Decreased balance;Decreased mobility;Decreased knowledge of use of DME;Decreased knowledge of precautions;Pain  PT  Treatment Interventions DME instruction;Gait training;Stair training;Functional mobility training;Therapeutic activities;Therapeutic exercise;Patient/family education   PT Goals (Current goals can be found in the Care Plan section) Acute Rehab PT Goals Patient Stated Goal: [place] PT Goal Formulation: With patient Time For Goal Achievement: 06/10/15 Potential to Achieve Goals: Good    Frequency 7X/week   Barriers to discharge        Co-evaluation               End of Session Equipment Utilized During Treatment: Gait belt;Left knee immobilizer Activity Tolerance: Patient tolerated treatment well Patient left: in chair;with call bell/phone within reach;with family/visitor present Nurse Communication: Mobility status;Other (comment) (pt nauseated)         Time: [id number] PT Time Calculation (min) (ACUTE ONLY): 26 min   Charges:   PT Evaluation $Initial PT Evaluation Tier I: 1 Procedure PT Treatments $Therapeutic Activity: 8-22 mins   PT G Codes:        Roney Marion Hamff 06/03/2015, 2:01 PM  Roney Marion, [place] Pager [phone] Office [phone]

## 2015-06-03 NOTE — Evaluation (Signed)
Occupational Therapy Evaluation Patient Details Name: [name] MRN: [id number] DOB: 06/29/1945 Today's Date: 06/03/2015    History of Present Illness Admitted for LTKA   Clinical Impression   Pt reports she was independent with ADLs and mobility PTA. Currently pt is overall min assist for functional mobility and min-mod assist for ADLs. Began ADL and safety education with pt; session limited by pain and nausea (RN aware). Pt planning to d/c home with 24/7 supervision from her husband. Pt would benefit from continued skilled OT in order to maximize independence and safety with LB ADLs, walk in shower transfers, toilet transfers, and ADLs in standing.     Follow Up Recommendations  No OT follow up;Supervision - Intermittent    Equipment Recommendations  3 in 1 bedside comode    Recommendations for Other Services       Precautions / Restrictions Precautions Precautions: Knee Precaution Booklet Issued: Yes (comment) Required Braces or Orthoses: Knee Immobilizer - Left Knee Immobilizer - Left: On when out of bed or walking Restrictions Weight Bearing Restrictions: Yes LLE Weight Bearing: Weight bearing as tolerated      Mobility Bed Mobility Overal bed mobility: Needs Assistance Bed Mobility: Sit to Supine     Supine to sit: Min guard Sit to supine: Min assist   General bed mobility comments: Min assist to manage LLE into bed  Transfers Overall transfer level: Needs assistance Equipment used: Rolling walker (2 wheeled) Transfers: Sit to/from [facility] Sit to Stand: Min assist Stand pivot transfers: Min assist       General transfer comment: Min assist to boost up and to steady in standing. VC for hand placement and technique.     Balance Overall balance assessment: Needs assistance Sitting-balance support: Feet supported Sitting balance-Leahy Scale: Fair     Standing balance support: Bilateral upper extremity supported Standing balance-Leahy  Scale: Poor Standing balance comment: RW for support                            ADL Overall ADL's : Needs assistance/impaired Eating/Feeding: Set up;Sitting   Grooming: Set up;Sitting       Lower Body Bathing: Minimal assistance;Sit to/from stand       Lower Body Dressing: Moderate assistance;Sit to/from stand Lower Body Dressing Details (indicate cue type and reason): Educated on compensatory strategies for LB ADLs. Husband reports he can assist with ADLs as needed. Toilet Transfer: Minimal Systems analyst Details (indicate cue type and reason): Simulated by transfer from chair to EOB. Pt still has foley.         Functional mobility during ADLs: Minimal assistance;Rolling walker (for stand pivot) General ADL Comments: Pts husband present for OT eval. Educated on home safety, ice for edema and pain; pt verbalized understanding.     Vision     Perception     Praxis      Pertinent Vitals/Pain Pain Assessment: Faces Pain Score: 7  Faces Pain Scale: Hurts even more Pain Location: L knee, +nausea Pain Descriptors / Indicators: Grimacing;Aching Pain Intervention(s): Limited activity within patient's tolerance;Monitored during session;Repositioned     Hand Dominance     Extremity/Trunk Assessment Upper Extremity Assessment Upper Extremity Assessment: Overall WFL for tasks assessed   Lower Extremity Assessment Lower Extremity Assessment: Defer to PT evaluation LLE Deficits / Details: Noted good quad set and able to straight leg raise against gravity with minimal lag   Cervical / Trunk Assessment Cervical / Trunk Assessment: Normal  Communication Communication Communication: No difficulties   Cognition Arousal/Alertness: Lethargic;Suspect due to medications Behavior During Therapy: [facility] for tasks assessed/performed Overall Cognitive Status: Within Functional Limits for tasks assessed                     General  Comments       Exercises Exercises: Total Joint     Shoulder Instructions      Home Living Family/patient expects to be discharged to:: Private residence Living Arrangements: Spouse/significant other Available Help at Discharge: Family;Available 24 hours/day Type of Home: Mobile home Home Access: Ramped entrance;Stairs to enter Entrance Stairs-Number of Steps: 4 Entrance Stairs-Rails: Right;Left Home Layout: One level     Bathroom Shower/Tub: Occupational psychologist: Handicapped height Bathroom Accessibility: Yes How Accessible: Accessible via walker Home Equipment: None          Prior Functioning/Environment Level of Independence: Independent             OT Diagnosis: Acute pain   OT Problem List: Decreased activity tolerance;Impaired balance (sitting and/or standing);Decreased safety awareness;Decreased knowledge of use of DME or AE;Decreased knowledge of precautions;Pain   OT Treatment/Interventions: Self-care/ADL training;DME and/or AE instruction;Patient/family education    OT Goals(Current goals can be found in the care plan section) Acute Rehab OT Goals Patient Stated Goal: return to PLOF OT Goal Formulation: With patient/family Time For Goal Achievement: 06/17/15 Potential to Achieve Goals: Good ADL Goals Pt Will Perform Grooming: with supervision;standing Pt Will Perform Lower Body Bathing: with supervision;sit to/from stand Pt Will Perform Lower Body Dressing: with supervision;sit to/from stand Pt Will Transfer to Toilet: with supervision;ambulating;bedside commode (over toilet) Pt Will Perform Toileting - Clothing Manipulation and hygiene: with supervision;sit to/from stand Pt Will Perform Tub/Shower Transfer: Shower transfer;with supervision;ambulating;3 in 1;rolling walker  OT Frequency: Min 2X/week   Barriers to D/C:            Co-evaluation              End of Session Equipment Utilized During Treatment: Gait belt;Rolling  walker;Left knee immobilizer CPM Left Knee CPM Left Knee: Off Nurse Communication: Other (comment) (pt with nausea, RN tech notified of mobility status)  Activity Tolerance: Patient limited by pain;Patient limited by lethargy;Other (comment) (Pt limited by nausea) Patient left: in bed;with call bell/phone within reach;with family/visitor present   Time: [id number] OT Time Calculation (min): 17 min Charges:  OT General Charges $OT Visit: 1 Procedure OT Evaluation $Initial OT Evaluation Tier I: 1 Procedure (low) G-Codes:     Binnie Kand M.S., OTR/L Pager: [phone]  06/03/2015, 4:01 PM

## 2015-06-03 NOTE — Discharge Instructions (Signed)
INSTRUCTIONS AFTER JOINT REPLACEMENT   WEAR KNEE IMMOBILIZER AT ALL TIMES EXCEPT WHEN USING CPM FOR THE NEXT ONE MONTH   o Remove items at home which could result in a fall. This includes throw rugs or furniture in walking pathways o ICE to the affected joint every three hours while awake for 30 minutes at a time, for at least the first 3-5 days, and then as needed for pain and swelling.  Continue to use ice for pain and swelling. You may notice swelling that will progress down to the foot and ankle.  This is normal after surgery.  Elevate your leg when you are not up walking on it.   o Continue to use the breathing machine you got in the hospital (incentive spirometer) which will help keep your temperature down.  It is common for your temperature to cycle up and down following surgery, especially at night when you are not up moving around and exerting yourself.  The breathing machine keeps your lungs expanded and your temperature down.  BLOOD THINNER: TAKE ELIQUIS AS PRECRIBED BY YOUR DOCTOR  DIET:  As you were doing prior to hospitalization, we recommend a well-balanced diet.  DRESSING / WOUND CARE / SHOWERING  You may change your dressing 3-5 days after surgery.  Then change the dressing every day with sterile gauze.  Please use good hand washing techniques before changing the dressing.  Do not use any lotions or creams on the incision until instructed by your surgeon. and You may shower 3 days after surgery, but keep the wounds dry during showering.  You may use an occlusive plastic wrap (Press'n Seal for example), NO SOAKING/SUBMERGING IN THE BATHTUB.  If the bandage gets wet, change with a clean dry gauze.  If the incision gets wet, pat the wound dry with a clean towel.  ACTIVITY  o Increase activity slowly as tolerated, but follow the weight bearing instructions below.   o No driving for 6 weeks or until further direction given by your physician.  You cannot drive while taking narcotics.    o No lifting or carrying greater than 10 lbs. until further directed by your surgeon. o Avoid periods of inactivity such as sitting longer than an hour when not asleep. This helps prevent blood clots.  o You may return to work once you are authorized by your doctor.     WEIGHT BEARING   Weight bearing as tolerated with assist device (walker, cane, etc) as directed, use it as long as suggested by your surgeon or therapist, typically at least 4-6 weeks.   EXERCISES  Results after joint replacement surgery are often greatly improved when you follow the exercise, range of motion and muscle strengthening exercises prescribed by your doctor. Safety measures are also important to protect the joint from further injury. Any time any of these exercises cause you to have increased pain or swelling, decrease what you are doing until you are comfortable again and then slowly increase them. If you have problems or questions, call your caregiver or physical therapist for advice.   Rehabilitation is important following a joint replacement. After just a few days of immobilization, the muscles of the leg can become weakened and shrink (atrophy).  These exercises are designed to build up the tone and strength of the thigh and leg muscles and to improve motion. Often times heat used for twenty to thirty minutes before working out will loosen up your tissues and help with improving the range of motion but  do not use heat for the first two weeks following surgery (sometimes heat can increase post-operative swelling).   These exercises can be done on a training (exercise) mat, on the floor, on a table or on a bed. Use whatever works the best and is most comfortable for you.    Use music or television while you are exercising so that the exercises are a pleasant break in your day. This will make your life better with the exercises acting as a break in your routine that you can look forward to.   Perform all exercises  about fifteen times, three times per day or as directed.  You should exercise both the operative leg and the other leg as well.  Exercises include:    Quad Sets - Tighten up the muscle on the front of the thigh (Quad) and hold for 5-10 seconds.    Straight Leg Raises - With your knee straight (if you were given a brace, keep it on), lift the leg to 60 degrees, hold for 3 seconds, and slowly lower the leg.  Perform this exercise against resistance later as your leg gets stronger.   Leg Slides: Lying on your back, slowly slide your foot toward your buttocks, bending your knee up off the floor (only go as far as is comfortable). Then slowly slide your foot back down until your leg is flat on the floor again.   Angel Wings: Lying on your back spread your legs to the side as far apart as you can without causing discomfort.   Hamstring Strength:  Lying on your back, push your heel against the floor with your leg straight by tightening up the muscles of your buttocks.  Repeat, but this time bend your knee to a comfortable angle, and push your heel against the floor.  You may put a pillow under the heel to make it more comfortable if necessary.   A rehabilitation program following joint replacement surgery can speed recovery and prevent re-injury in the future due to weakened muscles. Contact your doctor or a physical therapist for more information on knee rehabilitation.    CONSTIPATION  Constipation is defined medically as fewer than three stools per week and severe constipation as less than one stool per week.  Even if you have a regular bowel pattern at home, your normal regimen is likely to be disrupted due to multiple reasons following surgery.  Combination of anesthesia, postoperative narcotics, change in appetite and fluid intake all can affect your bowels.   YOU MUST use at least one of the following options; they are listed in order of increasing strength to get the job done.  They are all  available over the counter, and you may need to use some, POSSIBLY even all of these options:    Drink plenty of fluids (prune juice may be helpful) and high fiber foods Colace 100 mg by mouth twice a day  Senokot for constipation as directed and as needed Dulcolax (bisacodyl), take with full glass of water  Miralax (polyethylene glycol) once or twice a day as needed.  If you have tried all these things and are unable to have a bowel movement in the first 3-4 days after surgery call either your surgeon or your primary doctor.    If you experience loose stools or diarrhea, hold the medications until you stool forms back up.  If your symptoms do not get better within 1 week or if they get worse, check with your doctor.  If  you experience "the worst abdominal pain ever" or develop nausea or vomiting, please contact the office immediately for further recommendations for treatment.   ITCHING:  If you experience itching with your medications, try taking only a single pain pill, or even half a pain pill at a time.  You can also use Benadryl over the counter for itching or also to help with sleep.   TED HOSE STOCKINGS:  Use stockings on both legs until for at least 2 weeks or as directed by physician office. They may be removed at night for sleeping.  MEDICATIONS:  See your medication summary on the After Visit Summary that nursing will review with you.  You may have some home medications which will be placed on hold until you complete the course of blood thinner medication.  It is important for you to complete the blood thinner medication as prescribed.  PRECAUTIONS:  If you experience chest pain or shortness of breath - call 911 immediately for transfer to the hospital emergency department.   If you develop a fever greater that 101 F, purulent drainage from wound, increased redness or drainage from wound, foul odor from the wound/dressing, or calf pain - CONTACT YOUR SURGEON.                                                    FOLLOW-UP APPOINTMENTS:  If you do not already have a post-op appointment, please call the office for an appointment to be seen by your surgeon.  Guidelines for how soon to be seen are listed in your After Visit Summary, but are typically between 1-4 weeks after surgery.  OTHER INSTRUCTIONS:   Knee Replacement:  Do not place pillow under knee, focus on keeping the knee straight while resting. CPM instructions: 0-90 degrees, 2 hours in the morning, 2 hours in the afternoon, and 2 hours in the evening. Place foam block, curve side up under heel at all times except when in CPM or when walking.  DO NOT modify, tear, cut, or change the foam block in any way.  MAKE SURE YOU:   Understand these instructions.   Get help right away if you are not doing well or get worse.    Thank you for letting us be a part of your medical care team.  It is a privilege we respect greatly.  We hope these instructions will help you stay on track for a fast and full recovery!   Information on my medicine - ELIQUIS (apixaban)  This medication education was reviewed with me or my healthcare representative as part of my discharge preparation.  The pharmacist that spoke with me during my hospital stay was:  Romona Curls, [facility]  Why was Eliquis prescribed for you? Eliquis was prescribed for you to reduce the risk of blood clots forming after orthopedic surgery.    What do You need to know about Eliquis? Take your Eliquis TWICE DAILY - one tablet in the morning and one tablet in the evening with or without food.  It would be best to take the dose about the same time each day.  If you have difficulty swallowing the tablet whole please discuss with your pharmacist how to take the medication safely.  Take Eliquis exactly as prescribed by your doctor and DO NOT stop taking Eliquis without talking to the doctor who  prescribed the medication.  Stopping without other medication to take the  place of Eliquis may increase your risk of developing a clot.  After discharge, you should have regular check-up appointments with your healthcare provider that is prescribing your Eliquis.  What do you do if you miss a dose? If a dose of ELIQUIS is not taken at the scheduled time, take it as soon as possible on the same day and twice-daily administration should be resumed.  The dose should not be doubled to make up for a missed dose.  Do not take more than one tablet of ELIQUIS at the same time.  Important Safety Information A possible side effect of Eliquis is bleeding. You should call your healthcare provider right away if you experience any of the following: ? Bleeding from an injury or your nose that does not stop. ? Unusual colored urine (red or dark brown) or unusual colored stools (red or black). ? Unusual bruising for unknown reasons. ? A serious fall or if you hit your head (even if there is no bleeding).  Some medicines may interact with Eliquis and might increase your risk of bleeding or clotting while on Eliquis. To help avoid this, consult your healthcare provider or pharmacist prior to using any new prescription or non-prescription medications, including herbals, vitamins, non-steroidal anti-inflammatory drugs (NSAIDs) and supplements.  This website has more information on Eliquis (apixaban): http://www.eliquis.com/eliquis/home

## 2015-06-03 NOTE — Anesthesia Postprocedure Evaluation (Signed)
Anesthesia Post Note  Patient: [name]  Procedure(s) Performed: Procedure(s) (LRB): TOTAL LEFT KNEE ARTHROPLASTY (Left)  Patient location during evaluation: PACU Anesthesia Type: General Level of consciousness: awake and alert Pain management: pain level controlled Vital Signs Assessment: post-procedure vital signs reviewed and stable Respiratory status: spontaneous breathing, nonlabored ventilation, respiratory function stable and patient connected to nasal cannula oxygen Cardiovascular status: blood pressure returned to baseline and stable Postop Assessment: no signs of nausea or vomiting Anesthetic complications: no    Last Vitals:  Filed Vitals:   06/03/15 1030 06/03/15 1045  BP: 136/78 130/80  Pulse: 91 91  Temp: 36.3 C   Resp: 17 17    Last Pain: There were no vitals filed for this visit.               Zenaida Deed

## 2015-06-03 NOTE — Transfer of Care (Signed)
Immediate Anesthesia Transfer of Care Note  Patient: [name]  Procedure(s) Performed: Procedure(s): TOTAL LEFT KNEE ARTHROPLASTY (Left)  Patient Location: PACU  Anesthesia Type:Regional and Spinal  Level of Consciousness: awake, alert , oriented and sedated  Airway & Oxygen Therapy: Patient Spontanous Breathing and Patient connected to nasal cannula oxygen  Post-op Assessment: Report given to RN, Post -op Vital signs reviewed and stable and Patient moving all extremities  Post vital signs: Reviewed and stable  Last Vitals:  Filed Vitals:   06/03/15 0548 06/03/15 1000  BP: 155/64 121/64  Pulse: 73 92  Temp: 36.8 C 35.9 C  Resp: 18 17    Complications: No apparent anesthesia complications

## 2015-06-03 NOTE — Discharge Summary (Signed)
Patient ID: [name] MRN: [id number] DOB/AGE: [birth year] 70 y.o.  Admit date: 06/03/2015 Discharge date: 06/04/2015  Admission Diagnoses:  Principal Problem:   Primary localized osteoarthritis of left knee Active Problems:   GERD (gastroesophageal reflux disease)   High cholesterol   Hypertension   DJD (degenerative joint disease) of knee   Discharge Diagnoses:  Same  Past Medical History  Diagnosis Date  . High cholesterol   . Hypertension   . GERD (gastroesophageal reflux disease)   . Family history of adverse reaction to anesthesia     Patients mother had bad N/V after anesthesia  . Arthritis     "all over; mostly knees and shoulders"  (06/04/2015)   . Primary localized osteoarthritis of left knee   . Anxiety     Surgeries: Procedure(s): TOTAL LEFT KNEE ARTHROPLASTY on 06/03/2015   Consultants:    Discharged Condition: Improved  Hospital Course: [name] is an 69 y.o. female who was admitted 06/03/2015 for operative treatment ofPrimary localized osteoarthritis of left knee. Patient has severe unremitting pain that affects sleep, daily activities, and work/hobbies. After pre-op clearance the patient was taken to the operating room on 06/03/2015 and underwent  Procedure(s): TOTAL LEFT KNEE ARTHROPLASTY.  Patient with a pre-op Hb of 14.2 developed ABLA on pod#1 with a Hb of 10.0.  Patient is stable but we will continue to follow.  Patient was given perioperative antibiotics:      Anti-infectives    Start     Dose/Rate Route Frequency Ordered Stop   06/03/15 1800  vancomycin (VANCOCIN) IVPB 1000 mg/200 mL premix     1,000 mg 200 mL/hr over 60 Minutes Intravenous Every 12 hours 06/03/15 1140 06/03/15 1952   06/03/15 0500  vancomycin (VANCOCIN) IVPB 1000 mg/200 mL premix     1,000 mg 200 mL/hr over 60 Minutes Intravenous On call to O.R. 05/31/15 1233 06/03/15 0719       Patient was given sequential compression devices, early ambulation, and chemoprophylaxis to prevent  DVT.  Patient benefited maximally from hospital stay and there were no complications.    Recent vital signs:  Patient Vitals for the past 24 hrs:  BP Temp Temp src Pulse Resp SpO2  06/04/15 0450 (!) 112/53 mmHg 98 F (36.7 C) Oral 94 16 100 %  06/04/15 0106 123/60 mmHg 97.6 F (36.4 C) Oral 87 18 99 %  06/03/15 2039 121/68 mmHg 97.5 F (36.4 C) Oral 90 20 100 %  06/03/15 1129 131/67 mmHg 97.2 F (36.2 C) Oral 92 18 96 %  06/03/15 1100 131/75 mmHg 97.2 F (36.2 C) - 76 17 99 %  06/03/15 1045 130/80 mmHg - - 91 17 98 %  06/03/15 1030 136/78 mmHg 97.3 F (36.3 C) - 91 17 98 %  06/03/15 1015 132/77 mmHg - - 89 18 98 %  06/03/15 1000 121/64 mmHg (!) 96.6 F (35.9 C) - 92 17 98 %     Recent laboratory studies:   Recent Labs  06/04/15 0506  WBC 17.6*  HGB 10.0*  HCT 30.1*  PLT 237  NA 138  K 4.7  CL 106  CO2 27  BUN 11  CREATININE 0.87  GLUCOSE 126*  CALCIUM 8.6*     Discharge Medications:     Medication List    STOP taking these medications        acetaminophen 500 MG tablet  Commonly known as:  TYLENOL     aspirin 81 MG tablet     diclofenac 75 MG  EC tablet  Commonly known as:  VOLTAREN     ibuprofen 600 MG tablet  Commonly known as:  ADVIL,MOTRIN      TAKE these medications        ALIGN PO  Take 1 capsule by mouth daily.     ALPRAZolam 0.5 MG tablet  Commonly known as:  XANAX  Take 0.5 mg by mouth 2 (two) times daily as needed for anxiety.     apixaban 2.5 MG Tabs tablet  Commonly known as:  ELIQUIS  Take 1 tab po q12 hours x 14 days following surgery to prevent blood clots     atorvastatin 10 MG tablet  Commonly known as:  LIPITOR  Take 10 mg by mouth daily.     bisacodyl 5 MG EC tablet  Commonly known as:  DULCOLAX  Take 1 tablet (5 mg total) by mouth daily as needed for moderate constipation.     co-enzyme Q-10 30 MG capsule  Take 200 mg by mouth daily.     losartan-hydrochlorothiazide 50-12.5 MG tablet  Commonly known as:  HYZAAR   Take 1 tablet by mouth daily.     montelukast 10 MG tablet  Commonly known as:  SINGULAIR  Take 10 mg by mouth at bedtime.     ondansetron 4 MG tablet  Commonly known as:  ZOFRAN  Take 1 tablet (4 mg total) by mouth every 8 (eight) hours as needed for nausea or vomiting.     oxyCODONE 5 MG immediate release tablet  Commonly known as:  ROXICODONE  Take 1-2 tabs po q 4-6 hours prn pain     pantoprazole 40 MG tablet  Commonly known as:  PROTONIX  Take 40 mg by mouth daily.     RA KRILL OIL 500 MG Caps  Take 500 mg by mouth daily.     sucralfate 1 g tablet  Commonly known as:  CARAFATE  Take 1 g by mouth 4 (four) times daily as needed (for acid reflux).        Diagnostic Studies: No results found.  Disposition: 01-Home or Self Care    Follow-up Information    Follow up with Lorn Junes, MD. Schedule an appointment as soon as possible for a visit in 2 weeks.   Specialty:  Orthopedic Surgery   Contact information:   [address] [place] [address] [phone]        Signed: Fannie Knee 06/04/2015, 7:34 AM

## 2015-06-03 NOTE — Care Management (Signed)
Utilization review completed. Deshan Hemmelgarn, RN Case Manager [phone]. 

## 2015-06-03 NOTE — Op Note (Signed)
MRN:     [id number] DOB/AGE:    [birth year] / 70 y.o.       OPERATIVE REPORT    DATE OF PROCEDURE:  06/03/2015       PREOPERATIVE DIAGNOSIS:   Primary localized OA lt knee      Estimated body mass index is 31.12 kg/(m^2) as calculated from the following:   Height as of this encounter: 5\' 5"  (1.651 m).   Weight as of this encounter: 84.823 kg (187 lb).                                                        POSTOPERATIVE DIAGNOSIS:   same                                                                     PROCEDURE:  Procedure(s): TOTAL LEFT KNEE ARTHROPLASTY Using Depuy Attune RP implants #6 Femur, #6Tibia, 58mm attune RP bearing, 35 Patella     SURGEON: Vonda Harth A    ASSISTANT:  Sudie Bailey PA-C   (Present and scrubbed throughout the case, critical for assistance with exposure, retraction, instrumentation, and closure.)         ANESTHESIA: Spinal with Femoral Nerve Block  DRAINS: foley, 2 medium hemovac in knee   TOURNIQUET TIME: [id number]   COMPLICATIONS:  None     SPECIMENS: None   INDICATIONS FOR PROCEDURE: The patient has  djd lt knee, varus deformities, XR shows bone on bone arthritis. Patient has failed all conservative measures including anti-inflammatory medicines, narcotics, attempts at  exercise and weight loss, cortisone injections and viscosupplementation.  Risks and benefits of surgery have been discussed, questions answered.   DESCRIPTION OF PROCEDURE: The patient identified by armband, received  right femoral nerve block and IV antibiotics, in the holding area at [facility]. Patient taken to the operating room, appropriate anesthetic  monitors were attached General endotracheal anesthesia induced with  the patient in supine position, Foley catheter was inserted. Tourniquet  applied high to the operative thigh. Lateral post and foot positioner  applied to the table, the lower extremity was then prepped and draped  in usual sterile fashion from the ankle  to the tourniquet. Time-out procedure was performed. The limb was wrapped with an Esmarch bandage and the tourniquet inflated to 365 mmHg. We began the operation by making the anterior midline incision starting at handbreadth above the patella going over the patella 1 cm medial to and  4 cm distal to the tibial tubercle. Small bleeders in the skin and the  subcutaneous tissue identified and cauterized. Transverse retinaculum was incised and reflected medially and a medial parapatellar arthrotomy was accomplished. the patella was everted and theprepatellar fat pad resected. The superficial medial collateral  ligament was then elevated from anterior to posterior along the proximal  flare of the tibia and anterior half of the menisci resected. The knee was hyperflexed exposing bone on bone arthritis. Peripheral and notch osteophytes as well as the cruciate ligaments were then resected. We continued to  work our way around posteriorly along the proximal tibia, and externally  rotated the tibia subluxing it out from underneath the femur. A McHale  retractor was placed through the notch and a lateral Hohmann retractor  placed, and we then drilled through the proximal tibia in line with the  axis of the tibia followed by an intramedullary guide rod and 2-degree  posterior slope cutting guide. The tibial cutting guide was pinned into place  allowing resection of 4 mm of bone medially and about 6 mm of bone  laterally because of her varus deformity. Satisfied with the tibial resection, we then  entered the distal femur 2 mm anterior to the PCL origin with the  intramedullary guide rod and applied the distal femoral cutting guide  set at 37mm, with 5 degrees of valgus. This was pinned along the  epicondylar axis. At this point, the distal femoral cut was accomplished without difficulty. We then sized for a #6 femoral component and pinned the guide in 3 degrees of external rotation.The chamfer cutting guide was  pinned into place. The anterior, posterior, and chamfer cuts were accomplished without difficulty followed by  the Attune RP box cutting guide and the box cut. We also removed posterior osteophytes from the posterior femoral condyles. At this  time, the knee was brought into full extension. We checked our  extension and flexion gaps and found them symmetric at 22mm.  The patella thickness measured at 25 mm. We set the cutting guide at 15 and removed the posterior 9.5-10 mm  of the patella sized for 35 button and drilled the lollipop. The knee  was then once again hyperflexed exposing the proximal tibia. We sized for a #6 tibial base plate, applied the smokestack and the conical reamer followed by the the Delta fin keel punch. We then hammered into place the Attune RP trial femoral component, inserted a 7-mm trial bearing, trial patellar button, and took the knee through range of motion from 0-130 degrees. No thumb pressure was required for patellar  tracking. The posterior medial release done at the beginning of the case was held taught when the knee was taken through a full ROM which gave excellent stability .At this point, all trial components were removed, a double batch of DePuy HV cement was mixed and applied to all bony metallic mating surfaces except for the posterior condyles of the femur itself. In order, we  hammered into place the tibial tray and removed excess cement, the femoral component and removed excess cement, a 7-mm attune RP bearing  was inserted, and the knee brought to full extension with compression.  The patellar button was clamped into place, and excess cement  removed. While the cement cured the wound was irrigated out with normal saline solution pulse lavage, and medium Hemovac drains were placed.. Ligament stability and patellar tracking were checked and found to be excellent. The tourniquet was then released and hemostasis was obtained with cautery. The parapatellar arthrotomy  was closed with  #1 ethibond suture and the medial retinaculum was tightened for increased stability. The subcutaneous tissue with 0 and 2-0 undyed  Vicryl suture, and 4-0 Monocryl.. A dressing of Xeroform,  4 x 4, dressing sponges, Webril, and Ace wrap applied. Needle and sponge count were correct times 2.The patient awakened, extubated, and taken to recovery room without difficulty. Vascular status was normal, pulses 2+ and symmetric.   [name] A 06/03/2015, 9:38 AM

## 2015-06-03 NOTE — Anesthesia Procedure Notes (Addendum)
Spinal Patient location during procedure: OR Start time: 06/03/2015 7:23 AM End time: 06/03/2015 7:30 AM Staffing Anesthesiologist: Envi Eagleson Performed by: anesthesiologist  Preanesthetic Checklist Completed: patient identified, site marked, surgical consent, pre-op evaluation, timeout performed, IV checked, risks and benefits discussed and monitors and equipment checked Spinal Block Patient position: sitting Prep: DuraPrep Patient monitoring: heart rate, continuous pulse ox and blood pressure Location: L4-5 Injection technique: single-shot Needle Needle type: Quincke  Needle gauge: 22 G Needle length: 9 cm Additional Notes Functioning IV was confirmed and monitors were applied. Sterile prep and drape, including hand hygiene, mask and sterile gloves were used. The patient was positioned and the spine was prepped. The skin was anesthetized with lidocaine.  Free flow of clear CSF was obtained prior to injecting local anesthetic into the CSF.  The spinal needle aspirated freely following injection.  The needle was carefully withdrawn.  The patient tolerated the procedure well. Consent was obtained prior to procedure with all questions answered and concerns addressed.  [place] Pink, MD    Anesthesia Regional Block:  Adductor canal block  Pre-Anesthetic Checklist: ,, timeout performed, Correct Patient, Correct Site, Correct Laterality, Correct Procedure, Correct Position, site marked, Risks and benefits discussed,  Surgical consent,  Pre-op evaluation,  At surgeon's request and post-op pain management  Laterality: Left  Prep: chloraprep       Needles:  Injection technique: Single-shot  Needle Type: Echogenic Stimulator Needle     Needle Length: 5cm 5 cm Needle Gauge: 21 and 21 G    Additional Needles:  Procedures: ultrasound guided (picture in chart) Adductor canal block Narrative:  Injection made incrementally with aspirations every 5 mL.  Performed by: Personally   Anesthesiologist: Sumedha Munnerlyn  Additional Notes: Risks, benefits and alternative to block explained extensively.  Patient tolerated procedure well, without complications.

## 2015-06-03 NOTE — Progress Notes (Signed)
Orthopedic Tech Progress Note Patient Details:  [name] 1946-03-27 [id number] On cpm at 1830 Patient ID: [name], female   DOB: 12-13-1945, 70 y.o.   MRN: [id number]   [name] 06/03/2015, 6:36 PM

## 2015-06-03 NOTE — Interval H&P Note (Signed)
History and Physical Interval Note:  06/03/2015 7:09 AM  [name]  has presented today for surgery, with the diagnosis of primary localized OA lt knee  The various methods of treatment have been discussed with the patient and family. After consideration of risks, benefits and other options for treatment, the patient has consented to  Procedure(s): TOTAL LEFT KNEE ARTHROPLASTY (Left) as a surgical intervention .  The patient's history has been reviewed, patient examined, no change in status, stable for surgery.  I have reviewed the patient's chart and labs.  Questions were answered to the patient's satisfaction.     [name]

## 2015-06-03 NOTE — Progress Notes (Signed)
Orthopedic Tech Progress Note Patient Details:  [name] 12-21-1945 [id number] Viewed order from doctor's order list CPM Left Knee CPM Left Knee: On Left Knee Flexion (Degrees): 90 Left Knee Extension (Degrees): 0 Additional Comments: trapeze bar patient helper   Hildred Priest 06/03/2015, 10:31 AM

## 2015-06-04 ENCOUNTER — Encounter (HOSPITAL_COMMUNITY): Payer: Self-pay | Admitting: Orthopedic Surgery

## 2015-06-04 LAB — BASIC METABOLIC PANEL
Anion gap: 5 (ref 5–15)
BUN: 11 mg/dL (ref 6–20)
CHLORIDE: 106 mmol/L (ref 101–111)
CO2: 27 mmol/L (ref 22–32)
Calcium: 8.6 mg/dL — ABNORMAL LOW (ref 8.9–10.3)
Creatinine, Ser: 0.87 mg/dL (ref 0.44–1.00)
GFR calc Af Amer: >60 mL/min (ref >60)
GFR calc non Af Amer: >60 mL/min (ref >60)
GLUCOSE: 126 mg/dL — AB (ref 65–99)
POTASSIUM: 4.7 mmol/L (ref 3.5–5.1)
Sodium: 138 mmol/L (ref 135–145)

## 2015-06-04 LAB — CBC
HCT: 30.1 % — ABNORMAL LOW (ref 36.0–46.0)
HEMOGLOBIN: 10 g/dL — AB (ref 12.0–15.0)
MCH: 28.9 pg (ref 26.0–34.0)
MCHC: 33.2 g/dL (ref 30.0–36.0)
MCV: 87 fL (ref 78.0–100.0)
Platelets: 237 10*3/uL (ref 150–400)
RBC: 3.46 MIL/uL — AB (ref 3.87–5.11)
RDW: 12.6 % (ref 11.5–15.5)
WBC: 17.6 10*3/uL — ABNORMAL HIGH (ref 4.0–10.5)

## 2015-06-04 MED ORDER — CYCLOBENZAPRINE HCL 5 MG PO TABS: 5.0000 mg | ORAL_TABLET | Freq: Three times a day (TID) | ORAL | Status: DC | PRN

## 2015-06-04 NOTE — Progress Notes (Signed)
Physical Therapy Treatment Patient Details Name: [name] MRN: [id number] DOB: 09/11/1945 Today's Date: 06/04/2015    History of Present Illness Admitted for LTKA    PT Comments    Patient with improved gait mechanics and activity tolerance this session. Reviewed HEP hand out, use of bone foam, and CPM. Husband present for session. Current plan remains appropriate.  Follow Up Recommendations  Home health PT;Supervision - Intermittent     Equipment Recommendations  Rolling walker with 5" wheels;3in1 (PT)    Recommendations for Other Services OT consult     Precautions / Restrictions Precautions Precautions: Knee Precaution Booklet Issued: Yes (comment) Required Braces or Orthoses: Knee Immobilizer - Left Knee Immobilizer - Left: On when out of bed or walking Restrictions Weight Bearing Restrictions: Yes LLE Weight Bearing: Weight bearing as tolerated    Mobility  Bed Mobility Overal bed mobility: Needs Assistance Bed Mobility: Supine to Sit     Supine to sit: Supervision     General bed mobility comments: HOB and no use of bed rail  Transfers Overall transfer level: Needs assistance Equipment used: Rolling walker (2 wheeled) Transfers: Sit to/from Stand Sit to Stand: Min guard         General transfer comment: min guard for safety; carry over of safe hand placement and good technique  Ambulation/Gait Ambulation/Gait assistance: Min guard Ambulation Distance (Feet): 120 Feet Assistive device: Rolling walker (2 wheeled) Gait Pattern/deviations: Step-through pattern;Decreased stride length;Antalgic;Trunk flexed     General Gait Details: pt with improved gait mehanics with less vc needed for maintaining uprigth posture until fatigued   Stairs            Wheelchair Mobility    Modified Rankin (Stroke Patients Only)       Balance Overall balance assessment: Needs assistance Sitting-balance support: No upper extremity supported Sitting  balance-Leahy Scale: Fair     Standing balance support: Bilateral upper extremity supported Standing balance-Leahy Scale: Fair                      Cognition Arousal/Alertness: Awake/alert Behavior During Therapy: WFL for tasks assessed/performed Overall Cognitive Status: Within Functional Limits for tasks assessed                      Exercises Total Joint Exercises Long Arc Quad: AROM;Left;Seated;10 reps Knee Flexion: AROM;Left;10 reps;Seated (stretch with towel under foot to slide)    General Comments General comments (skin integrity, edema, etc.): husband present for session      Pertinent Vitals/Pain Pain Assessment: Faces Pain Score: 5  Faces Pain Scale: Hurts little more Pain Location: L knee Pain Descriptors / Indicators: Sore Pain Intervention(s): Limited activity within patient's tolerance;Monitored during session;Premedicated before session;Ice applied    Home Living                      Prior Function            PT Goals (current goals can now be found in the care plan section) Acute Rehab PT Goals Patient Stated Goal: none stated PT Goal Formulation: With patient Time For Goal Achievement: 06/10/15 Potential to Achieve Goals: Good Progress towards PT goals: Progressing toward goals    Frequency  7X/week    PT Plan Current plan remains appropriate    Co-evaluation             End of Session Equipment Utilized During Treatment: Gait belt;Left knee immobilizer Activity Tolerance: Patient tolerated treatment  well Patient left: in chair;with call bell/phone within reach;with family/visitor present     Time: [id number] PT Time Calculation (min) (ACUTE ONLY): 25 min  Charges:  $Gait Training: 8-22 mins                    G Codes:      Salina April, PTA Pager: [phone]   06/04/2015, 5:05 PM

## 2015-06-04 NOTE — Progress Notes (Signed)
Physical Therapy Treatment Patient Details Name: [name] MRN: [id number] DOB: 08-22-1945 Today's Date: 06/04/2015    History of Present Illness Admitted for LTKA    PT Comments    Patient is making good progress toward mobility goals with ability to ambulate 80 ft and tolerate exercises well. Overall mobility level of  Min guard/supervision this session. Reviewed HEP handout and no pillow under knee with pt. Continue to progress as tolerated. Current plan remains appropriate.    Follow Up Recommendations  Home health PT;Supervision - Intermittent     Equipment Recommendations  Rolling walker with 5" wheels;3in1 (PT)    Recommendations for Other Services OT consult     Precautions / Restrictions Precautions Precautions: Knee Precaution Booklet Issued: Yes (comment) Required Braces or Orthoses: Knee Immobilizer - Left Knee Immobilizer - Left: On when out of bed or walking Restrictions Weight Bearing Restrictions: Yes LLE Weight Bearing: Weight bearing as tolerated    Mobility  Bed Mobility Overal bed mobility: Needs Assistance Bed Mobility: Supine to Sit     Supine to sit: Supervision     General bed mobility comments: cues for technique; HOB flat; no physical assist needed but used bed rail to elevate trunk into sitting; extra time needed  Transfers Overall transfer level: Needs assistance Equipment used: Rolling walker (2 wheeled) Transfers: Sit to/from Stand Sit to Stand: Min guard         General transfer comment: min guard for safety; extra time to achieve upright posture; vc for hand placement for first trial from EOB; carry over of safe hand placement and technique when returning to chair  Ambulation/Gait Ambulation/Gait assistance: Min guard Ambulation Distance (Feet): 80 Feet Assistive device: Rolling walker (2 wheeled) Gait Pattern/deviations: Step-to pattern;Step-through pattern;Decreased step length - right;Decreased stance time - left;Decreased  stride length;Antalgic;Trunk flexed     General Gait Details: mod cues for maintaining upright posture, position of RW, sequencing; pt with ability to increase L stance time after first initial 25 ft   Stairs            Wheelchair Mobility    Modified Rankin (Stroke Patients Only)       Balance Overall balance assessment: Needs assistance Sitting-balance support: Feet supported Sitting balance-Leahy Scale: Fair     Standing balance support: Bilateral upper extremity supported Standing balance-Leahy Scale: Fair                      Cognition Arousal/Alertness: Awake/alert Behavior During Therapy: WFL for tasks assessed/performed Overall Cognitive Status: Within Functional Limits for tasks assessed                      Exercises Total Joint Exercises Quad Sets: AROM;Left;10 reps;Supine Short Arc Quad: AROM;Left;10 reps;Supine Heel Slides: AROM;AAROM;Left;10 reps;Supine Hip ABduction/ADduction: AROM;Left;10 reps;Supine Straight Leg Raises: AROM;Left;10 reps;Supine Goniometric ROM: 5-45    General Comments General comments (skin integrity, edema, etc.): husband present for session; pt to wear L KI for one month per PA      Pertinent Vitals/Pain Pain Assessment: 0-10 Pain Score: 5  Pain Location: L knee Pain Descriptors / Indicators: Sore (with activity) Pain Intervention(s): Limited activity within patient's tolerance;Monitored during session;Premedicated before session    Home Living                      Prior Function            PT Goals (current goals can now be found in the  care plan section) Acute Rehab PT Goals Patient Stated Goal: none stated PT Goal Formulation: With patient Time For Goal Achievement: 06/10/15 Potential to Achieve Goals: Good Progress towards PT goals: Progressing toward goals    Frequency  7X/week    PT Plan Current plan remains appropriate    Co-evaluation             End of Session  Equipment Utilized During Treatment: Gait belt;Left knee immobilizer Activity Tolerance: Patient tolerated treatment well Patient left: in chair;with call bell/phone within reach;with family/visitor present     Time: 0842-0908 PT Time Calculation (min) (ACUTE ONLY): 26 min  Charges:  $Gait Training: 8-22 mins $Therapeutic Exercise: 8-22 mins                    G Codes:      Salina April, PTA Pager: [phone]   06/04/2015, 10:45 AM

## 2015-06-04 NOTE — Progress Notes (Signed)
Occupational Therapy Treatment/Discharge Patient Details Name: [name] MRN: [id number] DOB: [birth year] Today's Date: 06/04/2015    History of present illness Admitted for [place]   OT comments  Pt declined to practice functional transfers with OT after x2 attempts today. Pt agreeable to talk through proper ste sequence for shower transfer, fall prevention strategies, safe DME use, techniques for LB ADLs, and pain medication management. All education has been completed and pt has no further question. Pt with no further acute OT needs. OT signing off.   Follow Up Recommendations  No OT follow up;Supervision - Intermittent    Equipment Recommendations  3 in 1 bedside comode    Recommendations for Other Services      Precautions / Restrictions Precautions Precautions: Knee Precaution Booklet Issued: No Required Braces or Orthoses: Knee Immobilizer - Left Knee Immobilizer - Left: On when out of bed or walking Restrictions Weight Bearing Restrictions: Yes LLE Weight Bearing: Weight bearing as tolerated       Mobility Bed Mobility Overal bed mobility: Needs Assistance Bed Mobility: Supine to Sit     Supine to sit: Supervision     General bed mobility comments: Pt up in chair on OT arrival  Transfers Overall transfer level: Needs assistance Equipment used: Rolling walker (2 wheeled) Transfers: Sit to/from Stand Sit to Stand: Min guard         General transfer comment: Pt declined to practice transfers with OT. Disucssed proper step sequence and OT advised pt/husband to wait until [facility] therapist arrives to attempt shower transfer.    Balance Overall balance assessment: Needs assistance Sitting-balance support: No upper extremity supported;Feet supported Sitting balance-Leahy Scale: Fair     Standing balance support: Bilateral upper extremity supported Standing balance-Leahy Scale: Fair                     ADL Overall ADL's : Needs assistance/impaired                                        General ADL Comments: Pt declined to get up and practice transfers. Discussed with pt/husband proper step sequence for shower transfer, technique to reach LB for ADLs, fall prevention strategies (scatter rugs, electrical cords, pets/small children), and pain medication management.       Vision                     Perception     Praxis      Cognition   Behavior During Therapy: [facility] for tasks assessed/performed Overall Cognitive Status: Within Functional Limits for tasks assessed                       Extremity/Trunk Assessment               Exercises Total Joint Exercises Quad Sets: AROM;Left;10 reps;Supine Short Arc Quad: AROM;Left;10 reps;Supine Heel Slides: AROM;AAROM;Left;10 reps;Supine Hip ABduction/ADduction: AROM;Left;10 reps;Supine Straight Leg Raises: AROM;Left;10 reps;Supine Goniometric ROM: 5-45   Shoulder Instructions       General Comments      Pertinent Vitals/ Pain       Pain Assessment: 0-10 Pain Score: 5  Pain Location: L knee Pain Descriptors / Indicators: Sore Pain Intervention(s): Limited activity within patient's tolerance;Monitored during session;Repositioned  Home Living  Prior Functioning/Environment              Frequency       Progress Toward Goals  OT Goals(current goals can now be found in the care plan section)  Progress towards OT goals: Goals met/education completed, patient discharged from OT  Acute Rehab OT Goals Patient Stated Goal: to go home OT Goal Formulation: With patient/family Time For Goal Achievement: 06/17/15 Potential to Achieve Goals: Good ADL Goals Pt Will Perform Grooming: with supervision;standing Pt Will Perform Lower Body Bathing: with supervision;sit to/from stand Pt Will Perform Lower Body Dressing: with supervision;sit to/from stand Pt Will Transfer to Toilet: with  supervision;ambulating;bedside commode Pt Will Perform Toileting - Clothing Manipulation and hygiene: with supervision;sit to/from stand Pt Will Perform Tub/Shower Transfer: Shower transfer;with supervision;ambulating;3 in 1;rolling walker  Plan All goals met and education completed, patient discharged from OT services    Co-evaluation                 End of Session     Activity Tolerance Other (comment) (Pt declined to attempt any transfers due to being d/c)   Patient Left in chair;with call bell/phone within reach;with family/visitor present   Nurse Communication Mobility status;Precautions        Time: 7425-9563 OT Time Calculation (min): 8 min  Charges: OT General Charges $OT Visit: 1 Procedure OT Treatments $Self Care/Home Management : 8-22 mins  Redmond Baseman, OTR/L  Pager: [phone] 06/04/2015, 2:11 PM

## 2015-06-04 NOTE — Progress Notes (Signed)
Subjective: 1 Day Post-Op Procedure(s) (LRB): TOTAL LEFT KNEE ARTHROPLASTY (Left) Patient reports pain as mild.  Patient reports nausea yesterday when sitting up for the first time.  No vomiting.  No lightheadedness/dizziness, chest pain/sob.  Positive flatus but no bm.  Tolerating diet.  Objective: Vital signs in last 24 hours: Temp:  [96.6 F (35.9 C)-98 F (36.7 C)] 98 F (36.7 C) (01/03 0450) Pulse Rate:  [76-94] 94 (01/03 0450) Resp:  [16-20] 16 (01/03 0450) BP: (112-136)/(53-80) 112/53 mmHg (01/03 0450) SpO2:  [96 %-100 %] 100 % (01/03 0450)  Intake/Output from previous day: 01/02 0701 - 01/03 0700 In: 1500 [I.V.:1500] Out: 2425 [Urine:2400; Blood:25] Intake/Output this shift:     Recent Labs  06/04/15 0506  HGB 10.0*    Recent Labs  06/04/15 0506  WBC 17.6*  RBC 3.46*  HCT 30.1*  PLT 237    Recent Labs  06/04/15 0506  NA 138  K 4.7  CL 106  CO2 27  BUN 11  CREATININE 0.87  GLUCOSE 126*  CALCIUM 8.6*   No results for input(s): LABPT, INR in the last 72 hours.  Neurologically intact Neurovascular intact Sensation intact distally Intact pulses distally Dorsiflexion/Plantar flexion intact Compartment soft  No drainage noted through dressing Negative homans bilaterally  Assessment/Plan: 1 Day Post-Op Procedure(s) (LRB): TOTAL LEFT KNEE ARTHROPLASTY (Left) Advance diet Up with therapy D/C IV fluids Discharge home with home health today as long as she progresses with PT ABLA-mild and stable Dry dressing change prn WBAT LLE- knee immobilizer at all times for the next one month unless in [place] 06/04/2015, 7:40 AM

## 2015-06-04 NOTE — Care Management Note (Addendum)
Case Management Note  Patient Details  Name: [name] MRN: [id number] Date of Birth: [birth year]  Subjective/Objective:  70 yr old female s/p left total knee arthroplasty.                   Action/Plan: Case manager spoke with patient and family concerning discharge plan and DME needs. Patient was preoperatively setup with [place], no changes. [facility] has delivered CPM to patient's home. Case manager will order 3in1 and rolling walker. Patient has family support at discharge.   Expected Discharge Date:    06/04/15              Expected Discharge Plan:  [place]  In-House Referral:     Discharge planning Services  CM Consult  Post Acute Care Choice:  Durable Medical Equipment Choice offered to:  Patient, Spouse  DME Arranged:  3-N-1, Walker rolling DME Agency:  [place]:  PT [place]:  [place]  Status of Service:  Completed, signed off  Medicare Important Message Given:    Date Medicare IM Given:    Medicare IM give by:    Date Additional Medicare IM Given:    Additional Medicare Important Message give by:     If discussed at [place] of Stay Meetings, dates discussed:    Additional Comments:  Ninfa Meeker, RN 06/04/2015, 12:04 PM

## 2015-06-04 NOTE — Progress Notes (Signed)
OT Cancellation Note  Patient Details Name: [name] MRN: [id number] DOB: 11/21/1945   Cancelled Treatment:    Reason Eval/Treat Not Completed: Patient declined, no reason specified. Pt reports she just finished working with PT; declined to work with OT at this time. Will check back as time allows.  Binnie Kand M.S., OTR/L Pager: [phone]  06/04/2015, 9:18 AM

## 2015-06-05 DIAGNOSIS — F419 Anxiety disorder, unspecified: Secondary | ICD-10-CM | POA: Diagnosis not present

## 2015-06-05 DIAGNOSIS — Z471 Aftercare following joint replacement surgery: Secondary | ICD-10-CM | POA: Diagnosis not present

## 2015-06-05 DIAGNOSIS — K219 Gastro-esophageal reflux disease without esophagitis: Secondary | ICD-10-CM | POA: Diagnosis not present

## 2015-06-05 DIAGNOSIS — Z96652 Presence of left artificial knee joint: Secondary | ICD-10-CM | POA: Diagnosis not present

## 2015-06-05 DIAGNOSIS — I10 Essential (primary) hypertension: Secondary | ICD-10-CM | POA: Diagnosis not present

## 2015-06-05 DIAGNOSIS — E7800 Pure hypercholesterolemia, unspecified: Secondary | ICD-10-CM | POA: Diagnosis not present

## 2015-06-07 DIAGNOSIS — E7800 Pure hypercholesterolemia, unspecified: Secondary | ICD-10-CM | POA: Diagnosis not present

## 2015-06-07 DIAGNOSIS — Z471 Aftercare following joint replacement surgery: Secondary | ICD-10-CM | POA: Diagnosis not present

## 2015-06-07 DIAGNOSIS — I10 Essential (primary) hypertension: Secondary | ICD-10-CM | POA: Diagnosis not present

## 2015-06-07 DIAGNOSIS — K219 Gastro-esophageal reflux disease without esophagitis: Secondary | ICD-10-CM | POA: Diagnosis not present

## 2015-06-07 DIAGNOSIS — F419 Anxiety disorder, unspecified: Secondary | ICD-10-CM | POA: Diagnosis not present

## 2015-06-07 DIAGNOSIS — Z96652 Presence of left artificial knee joint: Secondary | ICD-10-CM | POA: Diagnosis not present

## 2015-06-10 DIAGNOSIS — E7800 Pure hypercholesterolemia, unspecified: Secondary | ICD-10-CM | POA: Diagnosis not present

## 2015-06-10 DIAGNOSIS — K219 Gastro-esophageal reflux disease without esophagitis: Secondary | ICD-10-CM | POA: Diagnosis not present

## 2015-06-10 DIAGNOSIS — Z96652 Presence of left artificial knee joint: Secondary | ICD-10-CM | POA: Diagnosis not present

## 2015-06-10 DIAGNOSIS — I10 Essential (primary) hypertension: Secondary | ICD-10-CM | POA: Diagnosis not present

## 2015-06-10 DIAGNOSIS — F419 Anxiety disorder, unspecified: Secondary | ICD-10-CM | POA: Diagnosis not present

## 2015-06-10 DIAGNOSIS — Z471 Aftercare following joint replacement surgery: Secondary | ICD-10-CM | POA: Diagnosis not present

## 2015-06-12 DIAGNOSIS — K219 Gastro-esophageal reflux disease without esophagitis: Secondary | ICD-10-CM | POA: Diagnosis not present

## 2015-06-12 DIAGNOSIS — E7800 Pure hypercholesterolemia, unspecified: Secondary | ICD-10-CM | POA: Diagnosis not present

## 2015-06-12 DIAGNOSIS — Z96652 Presence of left artificial knee joint: Secondary | ICD-10-CM | POA: Diagnosis not present

## 2015-06-12 DIAGNOSIS — Z471 Aftercare following joint replacement surgery: Secondary | ICD-10-CM | POA: Diagnosis not present

## 2015-06-12 DIAGNOSIS — F419 Anxiety disorder, unspecified: Secondary | ICD-10-CM | POA: Diagnosis not present

## 2015-06-12 DIAGNOSIS — I10 Essential (primary) hypertension: Secondary | ICD-10-CM | POA: Diagnosis not present

## 2015-06-14 DIAGNOSIS — K219 Gastro-esophageal reflux disease without esophagitis: Secondary | ICD-10-CM | POA: Diagnosis not present

## 2015-06-14 DIAGNOSIS — E7800 Pure hypercholesterolemia, unspecified: Secondary | ICD-10-CM | POA: Diagnosis not present

## 2015-06-14 DIAGNOSIS — Z96652 Presence of left artificial knee joint: Secondary | ICD-10-CM | POA: Diagnosis not present

## 2015-06-14 DIAGNOSIS — Z471 Aftercare following joint replacement surgery: Secondary | ICD-10-CM | POA: Diagnosis not present

## 2015-06-14 DIAGNOSIS — F419 Anxiety disorder, unspecified: Secondary | ICD-10-CM | POA: Diagnosis not present

## 2015-06-14 DIAGNOSIS — I10 Essential (primary) hypertension: Secondary | ICD-10-CM | POA: Diagnosis not present

## 2015-06-17 ENCOUNTER — Encounter (INDEPENDENT_AMBULATORY_CARE_PROVIDER_SITE_OTHER): Payer: Self-pay | Admitting: Internal Medicine

## 2015-06-17 DIAGNOSIS — M25562 Pain in left knee: Secondary | ICD-10-CM | POA: Diagnosis not present

## 2015-06-17 DIAGNOSIS — R262 Difficulty in walking, not elsewhere classified: Secondary | ICD-10-CM | POA: Diagnosis not present

## 2015-06-17 DIAGNOSIS — Z96652 Presence of left artificial knee joint: Secondary | ICD-10-CM | POA: Diagnosis not present

## 2015-06-17 DIAGNOSIS — M25662 Stiffness of left knee, not elsewhere classified: Secondary | ICD-10-CM | POA: Diagnosis not present

## 2015-06-18 DIAGNOSIS — M25562 Pain in left knee: Secondary | ICD-10-CM | POA: Diagnosis not present

## 2015-06-18 DIAGNOSIS — Z96652 Presence of left artificial knee joint: Secondary | ICD-10-CM | POA: Diagnosis not present

## 2015-06-18 DIAGNOSIS — R262 Difficulty in walking, not elsewhere classified: Secondary | ICD-10-CM | POA: Diagnosis not present

## 2015-06-18 DIAGNOSIS — M25662 Stiffness of left knee, not elsewhere classified: Secondary | ICD-10-CM | POA: Diagnosis not present

## 2015-06-20 DIAGNOSIS — R262 Difficulty in walking, not elsewhere classified: Secondary | ICD-10-CM | POA: Diagnosis not present

## 2015-06-20 DIAGNOSIS — M25562 Pain in left knee: Secondary | ICD-10-CM | POA: Diagnosis not present

## 2015-06-20 DIAGNOSIS — M25662 Stiffness of left knee, not elsewhere classified: Secondary | ICD-10-CM | POA: Diagnosis not present

## 2015-06-20 DIAGNOSIS — Z96652 Presence of left artificial knee joint: Secondary | ICD-10-CM | POA: Diagnosis not present

## 2015-06-24 DIAGNOSIS — R262 Difficulty in walking, not elsewhere classified: Secondary | ICD-10-CM | POA: Diagnosis not present

## 2015-06-24 DIAGNOSIS — Z96652 Presence of left artificial knee joint: Secondary | ICD-10-CM | POA: Diagnosis not present

## 2015-06-24 DIAGNOSIS — M25662 Stiffness of left knee, not elsewhere classified: Secondary | ICD-10-CM | POA: Diagnosis not present

## 2015-06-24 DIAGNOSIS — M25562 Pain in left knee: Secondary | ICD-10-CM | POA: Diagnosis not present

## 2015-06-26 DIAGNOSIS — Z96652 Presence of left artificial knee joint: Secondary | ICD-10-CM | POA: Diagnosis not present

## 2015-06-26 DIAGNOSIS — R262 Difficulty in walking, not elsewhere classified: Secondary | ICD-10-CM | POA: Diagnosis not present

## 2015-06-26 DIAGNOSIS — M25662 Stiffness of left knee, not elsewhere classified: Secondary | ICD-10-CM | POA: Diagnosis not present

## 2015-06-26 DIAGNOSIS — M25562 Pain in left knee: Secondary | ICD-10-CM | POA: Diagnosis not present

## 2015-06-28 DIAGNOSIS — Z96652 Presence of left artificial knee joint: Secondary | ICD-10-CM | POA: Diagnosis not present

## 2015-06-28 DIAGNOSIS — M25662 Stiffness of left knee, not elsewhere classified: Secondary | ICD-10-CM | POA: Diagnosis not present

## 2015-06-28 DIAGNOSIS — M25562 Pain in left knee: Secondary | ICD-10-CM | POA: Diagnosis not present

## 2015-06-28 DIAGNOSIS — R262 Difficulty in walking, not elsewhere classified: Secondary | ICD-10-CM | POA: Diagnosis not present

## 2015-07-02 DIAGNOSIS — R262 Difficulty in walking, not elsewhere classified: Secondary | ICD-10-CM | POA: Diagnosis not present

## 2015-07-02 DIAGNOSIS — M25562 Pain in left knee: Secondary | ICD-10-CM | POA: Diagnosis not present

## 2015-07-02 DIAGNOSIS — Z96652 Presence of left artificial knee joint: Secondary | ICD-10-CM | POA: Diagnosis not present

## 2015-07-02 DIAGNOSIS — M25662 Stiffness of left knee, not elsewhere classified: Secondary | ICD-10-CM | POA: Diagnosis not present

## 2015-07-04 DIAGNOSIS — M25662 Stiffness of left knee, not elsewhere classified: Secondary | ICD-10-CM | POA: Diagnosis not present

## 2015-07-04 DIAGNOSIS — R262 Difficulty in walking, not elsewhere classified: Secondary | ICD-10-CM | POA: Diagnosis not present

## 2015-07-04 DIAGNOSIS — Z96652 Presence of left artificial knee joint: Secondary | ICD-10-CM | POA: Diagnosis not present

## 2015-07-04 DIAGNOSIS — M25562 Pain in left knee: Secondary | ICD-10-CM | POA: Diagnosis not present

## 2015-07-05 DIAGNOSIS — M25562 Pain in left knee: Secondary | ICD-10-CM | POA: Diagnosis not present

## 2015-07-05 DIAGNOSIS — M25662 Stiffness of left knee, not elsewhere classified: Secondary | ICD-10-CM | POA: Diagnosis not present

## 2015-07-05 DIAGNOSIS — R262 Difficulty in walking, not elsewhere classified: Secondary | ICD-10-CM | POA: Diagnosis not present

## 2015-07-05 DIAGNOSIS — Z96652 Presence of left artificial knee joint: Secondary | ICD-10-CM | POA: Diagnosis not present

## 2015-07-08 DIAGNOSIS — Z96652 Presence of left artificial knee joint: Secondary | ICD-10-CM | POA: Diagnosis not present

## 2015-07-08 DIAGNOSIS — M25662 Stiffness of left knee, not elsewhere classified: Secondary | ICD-10-CM | POA: Diagnosis not present

## 2015-07-08 DIAGNOSIS — R262 Difficulty in walking, not elsewhere classified: Secondary | ICD-10-CM | POA: Diagnosis not present

## 2015-07-08 DIAGNOSIS — M25562 Pain in left knee: Secondary | ICD-10-CM | POA: Diagnosis not present

## 2015-07-10 DIAGNOSIS — Z96652 Presence of left artificial knee joint: Secondary | ICD-10-CM | POA: Diagnosis not present

## 2015-07-10 DIAGNOSIS — M25562 Pain in left knee: Secondary | ICD-10-CM | POA: Diagnosis not present

## 2015-07-10 DIAGNOSIS — M25662 Stiffness of left knee, not elsewhere classified: Secondary | ICD-10-CM | POA: Diagnosis not present

## 2015-07-10 DIAGNOSIS — R262 Difficulty in walking, not elsewhere classified: Secondary | ICD-10-CM | POA: Diagnosis not present

## 2015-07-12 DIAGNOSIS — R262 Difficulty in walking, not elsewhere classified: Secondary | ICD-10-CM | POA: Diagnosis not present

## 2015-07-12 DIAGNOSIS — M25562 Pain in left knee: Secondary | ICD-10-CM | POA: Diagnosis not present

## 2015-07-12 DIAGNOSIS — Z96652 Presence of left artificial knee joint: Secondary | ICD-10-CM | POA: Diagnosis not present

## 2015-07-12 DIAGNOSIS — M25662 Stiffness of left knee, not elsewhere classified: Secondary | ICD-10-CM | POA: Diagnosis not present

## 2015-07-15 DIAGNOSIS — Z96652 Presence of left artificial knee joint: Secondary | ICD-10-CM | POA: Diagnosis not present

## 2015-07-15 DIAGNOSIS — M25662 Stiffness of left knee, not elsewhere classified: Secondary | ICD-10-CM | POA: Diagnosis not present

## 2015-07-15 DIAGNOSIS — M25562 Pain in left knee: Secondary | ICD-10-CM | POA: Diagnosis not present

## 2015-07-15 DIAGNOSIS — R262 Difficulty in walking, not elsewhere classified: Secondary | ICD-10-CM | POA: Diagnosis not present

## 2015-07-22 DIAGNOSIS — Z96652 Presence of left artificial knee joint: Secondary | ICD-10-CM | POA: Diagnosis not present

## 2015-07-22 DIAGNOSIS — I10 Essential (primary) hypertension: Secondary | ICD-10-CM | POA: Diagnosis not present

## 2015-07-22 DIAGNOSIS — Z1389 Encounter for screening for other disorder: Secondary | ICD-10-CM | POA: Diagnosis not present

## 2015-07-22 DIAGNOSIS — Z6830 Body mass index (BMI) 30.0-30.9, adult: Secondary | ICD-10-CM | POA: Diagnosis not present

## 2015-08-28 DIAGNOSIS — J209 Acute bronchitis, unspecified: Secondary | ICD-10-CM | POA: Diagnosis not present

## 2015-08-28 DIAGNOSIS — E6609 Other obesity due to excess calories: Secondary | ICD-10-CM | POA: Diagnosis not present

## 2015-08-28 DIAGNOSIS — J069 Acute upper respiratory infection, unspecified: Secondary | ICD-10-CM | POA: Diagnosis not present

## 2015-08-28 DIAGNOSIS — Z1389 Encounter for screening for other disorder: Secondary | ICD-10-CM | POA: Diagnosis not present

## 2015-08-28 DIAGNOSIS — Z6830 Body mass index (BMI) 30.0-30.9, adult: Secondary | ICD-10-CM | POA: Diagnosis not present

## 2015-09-11 ENCOUNTER — Ambulatory Visit (INDEPENDENT_AMBULATORY_CARE_PROVIDER_SITE_OTHER): Payer: Medicare Other | Admitting: Internal Medicine

## 2015-09-11 DIAGNOSIS — H40053 Ocular hypertension, bilateral: Secondary | ICD-10-CM | POA: Diagnosis not present

## 2015-10-15 ENCOUNTER — Encounter (INDEPENDENT_AMBULATORY_CARE_PROVIDER_SITE_OTHER): Payer: Self-pay | Admitting: *Deleted

## 2015-11-11 DIAGNOSIS — N342 Other urethritis: Secondary | ICD-10-CM | POA: Diagnosis not present

## 2015-11-11 DIAGNOSIS — Z6831 Body mass index (BMI) 31.0-31.9, adult: Secondary | ICD-10-CM | POA: Diagnosis not present

## 2015-11-11 DIAGNOSIS — Z1389 Encounter for screening for other disorder: Secondary | ICD-10-CM | POA: Diagnosis not present

## 2016-01-24 ENCOUNTER — Other Ambulatory Visit (INDEPENDENT_AMBULATORY_CARE_PROVIDER_SITE_OTHER): Payer: Self-pay | Admitting: Internal Medicine

## 2016-01-24 DIAGNOSIS — Z1211 Encounter for screening for malignant neoplasm of colon: Secondary | ICD-10-CM

## 2016-02-10 DIAGNOSIS — Z1389 Encounter for screening for other disorder: Secondary | ICD-10-CM | POA: Diagnosis not present

## 2016-02-10 DIAGNOSIS — E782 Mixed hyperlipidemia: Secondary | ICD-10-CM | POA: Diagnosis not present

## 2016-02-10 DIAGNOSIS — F419 Anxiety disorder, unspecified: Secondary | ICD-10-CM | POA: Diagnosis not present

## 2016-02-10 DIAGNOSIS — I10 Essential (primary) hypertension: Secondary | ICD-10-CM | POA: Diagnosis not present

## 2016-02-10 DIAGNOSIS — Z6831 Body mass index (BMI) 31.0-31.9, adult: Secondary | ICD-10-CM | POA: Diagnosis not present

## 2016-02-12 DIAGNOSIS — K7689 Other specified diseases of liver: Secondary | ICD-10-CM | POA: Diagnosis not present

## 2016-02-12 DIAGNOSIS — I10 Essential (primary) hypertension: Secondary | ICD-10-CM | POA: Diagnosis not present

## 2016-02-12 DIAGNOSIS — E782 Mixed hyperlipidemia: Secondary | ICD-10-CM | POA: Diagnosis not present

## 2016-02-18 DIAGNOSIS — Z96652 Presence of left artificial knee joint: Secondary | ICD-10-CM | POA: Diagnosis not present

## 2016-03-06 ENCOUNTER — Encounter (INDEPENDENT_AMBULATORY_CARE_PROVIDER_SITE_OTHER): Payer: Self-pay | Admitting: *Deleted

## 2016-03-06 ENCOUNTER — Telehealth (INDEPENDENT_AMBULATORY_CARE_PROVIDER_SITE_OTHER): Payer: Self-pay | Admitting: *Deleted

## 2016-03-06 MED ORDER — PEG 3350-KCL-NA BICARB-NACL 420 G PO SOLR: 4000.0000 mL | Freq: Once | ORAL | 0 refills | Status: AC

## 2016-03-06 NOTE — Telephone Encounter (Signed)
Patient needs trilyte 

## 2016-03-23 DIAGNOSIS — Z23 Encounter for immunization: Secondary | ICD-10-CM | POA: Diagnosis not present

## 2016-03-25 ENCOUNTER — Telehealth (INDEPENDENT_AMBULATORY_CARE_PROVIDER_SITE_OTHER): Payer: Self-pay | Admitting: *Deleted

## 2016-03-25 NOTE — Telephone Encounter (Signed)
Referring MD/PCP: [place]   Procedure: tcs  Reason/Indication:  screening  Has patient had this procedure before?  Yes, 10 yrs ago  If so, when, by whom and where?    Is there a family history of colon cancer?  no  Who?  What age when diagnosed?    Is patient diabetic?   no      Does patient have prosthetic heart valve or mechanical valve?  no  Do you have a pacemaker?  no  Has patient ever had endocarditis? no  Has patient had joint replacement within last 12 months?  Yes, left knee 06/2015  Does patient tend to be constipated or take laxatives? no  Does patient have a history of alcohol/drug use?  no  Is patient on Coumadin, Plavix and/or Aspirin? yes  Medications: see epic  Allergies: see epic  Medication Adjustment: asa 2 days  Procedure date & time: 04/22/16 at 1200

## 2016-03-26 NOTE — Telephone Encounter (Signed)
agree

## 2016-04-06 ENCOUNTER — Telehealth (INDEPENDENT_AMBULATORY_CARE_PROVIDER_SITE_OTHER): Payer: Self-pay | Admitting: *Deleted

## 2016-04-06 NOTE — Telephone Encounter (Signed)
Referring MD/PCP: golding   Procedure: tcs  Reason/Indication:  screening  Has patient had this procedure before?  Yes, 10 yrs ago  If so, when, by whom and where?    Is there a family history of colon cancer?  no  Who?  What age when diagnosed?    Is patient diabetic?   no      Does patient have prosthetic heart valve or mechanical valve?  no  Do you have a pacemaker?  no  Has patient ever had endocarditis? no  Has patient had joint replacement within last 12 months?  Left knee 06/2015  Does patient tend to be constipated or take laxatives? some  Does patient have a history of alcohol/drug use?  no  Is patient on Coumadin, Plavix and/or Aspirin? yes  Medications: asa 81 mg daily, diclofenac 75 mg prn, xanax 0.5 mg nightly, singulair 10 mg daily, losartan 50/12.5 mg daily, pantoprazole 40 mg daily, atorvastatin 10 mg daily, centrum silver  Allergies: cefazolin  Medication Adjustment: asa 2 days  Procedure date & time: 04/22/16 at 1200

## 2016-04-06 NOTE — Telephone Encounter (Signed)
agree

## 2016-04-08 ENCOUNTER — Ambulatory Visit (INDEPENDENT_AMBULATORY_CARE_PROVIDER_SITE_OTHER): Payer: Medicare Other | Admitting: Internal Medicine

## 2016-04-08 ENCOUNTER — Encounter (INDEPENDENT_AMBULATORY_CARE_PROVIDER_SITE_OTHER): Payer: Self-pay | Admitting: Internal Medicine

## 2016-04-08 ENCOUNTER — Encounter (INDEPENDENT_AMBULATORY_CARE_PROVIDER_SITE_OTHER): Payer: Self-pay

## 2016-04-08 VITALS — BP 132/82 | HR 72 | Temp 98.2°F | Ht 65.0 in | Wt 190.4 lb

## 2016-04-08 DIAGNOSIS — K219 Gastro-esophageal reflux disease without esophagitis: Secondary | ICD-10-CM

## 2016-04-08 NOTE — Progress Notes (Signed)
   Subjective:    Patient ID: [name], female    DOB: 01/06/1946, 70 y.o.   MRN: [id number]  HPIHere today for f/u. She tells me she is doing good. She is wonderful with her knee replacement. Her appetite is good. No weight loss. Acid reflux in controlled with Protonix.  She has a BM daily. No melena or BRRB.   Scheduled for a colonoscopy this month. Last colonoscopy in 10/02/2005 for screening: Dr. Arnoldo Morale; Multiple medium diverticula in the sigmoid colon. Colonoscopy otherwise normal.  Repeat in 10 years Review of Systems Past Medical History:  Diagnosis Date  . Anxiety   . Arthritis    "all over; mostly knees and shoulders"  (06/04/2015)   . Family history of adverse reaction to anesthesia    Patients mother had bad N/V after anesthesia  . GERD (gastroesophageal reflux disease)   . High cholesterol   . Hypertension   . Primary localized osteoarthritis of left knee     Past Surgical History:  Procedure Laterality Date  . APPENDECTOMY    . CHOLECYSTECTOMY OPEN     pain  . COLONOSCOPY W/ POLYPECTOMY    . JOINT REPLACEMENT    . KNEE ARTHROSCOPY W/ MENISCECTOMY Left   . KNEE SURGERY Left 1990s  . OOPHORECTOMY     for a cyst ? side  . TOTAL KNEE ARTHROPLASTY Left 06/03/2015  . TOTAL KNEE ARTHROPLASTY Left 06/03/2015   Procedure: TOTAL LEFT KNEE ARTHROPLASTY;  Surgeon: Elsie Saas, MD;  Location: [place];  Service: Orthopedics;  Laterality: Left;  . TUBAL LIGATION      Allergies  Allergen Reactions  . Cefazolin Rash    Current Outpatient Prescriptions on File Prior to Visit  Medication Sig Dispense Refill  . atorvastatin (LIPITOR) 10 MG tablet Take 10 mg by mouth daily.    Marland Kitchen losartan-hydrochlorothiazide (HYZAAR) 50-12.5 MG per tablet Take 1 tablet by mouth daily.    . montelukast (SINGULAIR) 10 MG tablet Take 10 mg by mouth at bedtime.    . pantoprazole (PROTONIX) 40 MG tablet Take 40 mg by mouth daily.    . Probiotic Product (ALIGN PO) Take 1 capsule by mouth daily.     .  sucralfate (CARAFATE) 1 G tablet Take 1 g by mouth 4 (four) times daily as needed (for acid reflux).     . ondansetron (ZOFRAN) 4 MG tablet Take 1 tablet (4 mg total) by mouth every 8 (eight) hours as needed for nausea or vomiting. 40 tablet 0   No current facility-administered medications on file prior to visit.        Objective:   Physical Exam Blood pressure 132/82, pulse 72, temperature 98.2 F (36.8 C), height 5\' 5"  (1.651 m), weight 190 lb 6.4 oz (86.4 kg). Alert and oriented. Skin warm and dry. Oral mucosa is moist.   . Sclera anicteric, conjunctivae is pink. Thyroid not enlarged. No cervical lymphadenopathy. Lungs clear. Heart regular rate and rhythm.  Abdomen is soft. Bowel sounds are positive. No hepatomegaly. No abdominal masses felt. No tenderness.  No edema to lower extremities.          Assessment & Plan:  GERD controlled with Protonix. She is doing well.  OV in 1 year.

## 2016-04-08 NOTE — Patient Instructions (Signed)
Continue the Protonix. OV in 1 year.  

## 2016-04-13 DIAGNOSIS — E785 Hyperlipidemia, unspecified: Secondary | ICD-10-CM | POA: Diagnosis not present

## 2016-04-13 DIAGNOSIS — I10 Essential (primary) hypertension: Secondary | ICD-10-CM | POA: Diagnosis not present

## 2016-04-13 DIAGNOSIS — K219 Gastro-esophageal reflux disease without esophagitis: Secondary | ICD-10-CM | POA: Diagnosis not present

## 2016-04-13 DIAGNOSIS — Z6831 Body mass index (BMI) 31.0-31.9, adult: Secondary | ICD-10-CM | POA: Diagnosis not present

## 2016-04-22 ENCOUNTER — Encounter (HOSPITAL_COMMUNITY)
Admission: RE | Disposition: A | Discharge status: Home / Self Care | Payer: Self-pay | Source: Ambulatory Visit | Attending: Internal Medicine

## 2016-04-22 ENCOUNTER — Ambulatory Visit (HOSPITAL_COMMUNITY)
Admission: RE | Admit: 2016-04-22 | Discharge: 2016-04-22 | Disposition: A | Discharge status: Home / Self Care | Payer: Medicare Other | Source: Ambulatory Visit | Attending: Internal Medicine | Admitting: Internal Medicine

## 2016-04-22 ENCOUNTER — Encounter (HOSPITAL_COMMUNITY): Payer: Self-pay | Admitting: *Deleted

## 2016-04-22 DIAGNOSIS — Z8489 Family history of other specified conditions: Secondary | ICD-10-CM | POA: Insufficient documentation

## 2016-04-22 DIAGNOSIS — M1712 Unilateral primary osteoarthritis, left knee: Secondary | ICD-10-CM | POA: Insufficient documentation

## 2016-04-22 DIAGNOSIS — I10 Essential (primary) hypertension: Secondary | ICD-10-CM | POA: Insufficient documentation

## 2016-04-22 DIAGNOSIS — Z888 Allergy status to other drugs, medicaments and biological substances status: Secondary | ICD-10-CM | POA: Insufficient documentation

## 2016-04-22 DIAGNOSIS — Z841 Family history of disorders of kidney and ureter: Secondary | ICD-10-CM | POA: Diagnosis not present

## 2016-04-22 DIAGNOSIS — F419 Anxiety disorder, unspecified: Secondary | ICD-10-CM | POA: Insufficient documentation

## 2016-04-22 DIAGNOSIS — D123 Benign neoplasm of transverse colon: Secondary | ICD-10-CM | POA: Diagnosis not present

## 2016-04-22 DIAGNOSIS — K219 Gastro-esophageal reflux disease without esophagitis: Secondary | ICD-10-CM | POA: Diagnosis not present

## 2016-04-22 DIAGNOSIS — Z96652 Presence of left artificial knee joint: Secondary | ICD-10-CM | POA: Diagnosis not present

## 2016-04-22 DIAGNOSIS — E7800 Pure hypercholesterolemia, unspecified: Secondary | ICD-10-CM | POA: Diagnosis not present

## 2016-04-22 DIAGNOSIS — Z7982 Long term (current) use of aspirin: Secondary | ICD-10-CM | POA: Insufficient documentation

## 2016-04-22 DIAGNOSIS — D122 Benign neoplasm of ascending colon: Secondary | ICD-10-CM | POA: Diagnosis not present

## 2016-04-22 DIAGNOSIS — K644 Residual hemorrhoidal skin tags: Secondary | ICD-10-CM | POA: Insufficient documentation

## 2016-04-22 DIAGNOSIS — Z9049 Acquired absence of other specified parts of digestive tract: Secondary | ICD-10-CM | POA: Diagnosis not present

## 2016-04-22 DIAGNOSIS — Z79899 Other long term (current) drug therapy: Secondary | ICD-10-CM | POA: Diagnosis not present

## 2016-04-22 DIAGNOSIS — K5730 Diverticulosis of large intestine without perforation or abscess without bleeding: Secondary | ICD-10-CM

## 2016-04-22 DIAGNOSIS — Z8249 Family history of ischemic heart disease and other diseases of the circulatory system: Secondary | ICD-10-CM | POA: Diagnosis not present

## 2016-04-22 DIAGNOSIS — Z1211 Encounter for screening for malignant neoplasm of colon: Secondary | ICD-10-CM | POA: Diagnosis not present

## 2016-04-22 DIAGNOSIS — Z833 Family history of diabetes mellitus: Secondary | ICD-10-CM | POA: Insufficient documentation

## 2016-04-22 DIAGNOSIS — Z860101 Personal history of adenomatous and serrated colon polyps: Secondary | ICD-10-CM | POA: Insufficient documentation

## 2016-04-22 HISTORY — PX: COLONOSCOPY: SHX5424

## 2016-04-22 SURGERY — COLONOSCOPY
Anesthesia: Moderate Sedation

## 2016-04-22 MED ORDER — MIDAZOLAM HCL 5 MG/5ML IJ SOLN
INTRAMUSCULAR | Status: DC | PRN
  Administered 2016-04-22: 2 mg via INTRAVENOUS
  Administered 2016-04-22: 1 mg via INTRAVENOUS
  Administered 2016-04-22 (×2): 2 mg via INTRAVENOUS

## 2016-04-22 MED ORDER — SODIUM CHLORIDE 0.9 % IV SOLN
INTRAVENOUS | Status: DC
  Administered 2016-04-22: 07:00:00 via INTRAVENOUS

## 2016-04-22 MED ORDER — LEVOFLOXACIN IN D5W 750 MG/150ML IV SOLN
750.0000 mg | Freq: Once | INTRAVENOUS | Status: AC
  Administered 2016-04-22: 750 mg via INTRAVENOUS

## 2016-04-22 MED ORDER — MIDAZOLAM HCL 5 MG/5ML IJ SOLN
INTRAMUSCULAR | Status: AC
  Filled 2016-04-22: qty 10

## 2016-04-22 MED ORDER — SIMETHICONE 40 MG/0.6ML PO SUSP
ORAL | Status: DC | PRN
  Administered 2016-04-22: 08:00:00

## 2016-04-22 MED ORDER — MEPERIDINE HCL 50 MG/ML IJ SOLN
INTRAMUSCULAR | Status: AC
  Filled 2016-04-22: qty 1

## 2016-04-22 MED ORDER — LEVOFLOXACIN IN D5W 750 MG/150ML IV SOLN
INTRAVENOUS | Status: AC
  Filled 2016-04-22: qty 150

## 2016-04-22 MED ORDER — MEPERIDINE HCL 50 MG/ML IJ SOLN
INTRAMUSCULAR | Status: DC | PRN
  Administered 2016-04-22 (×2): 25 mg via INTRAVENOUS

## 2016-04-22 NOTE — Discharge Instructions (Signed)
High-Fiber Diet Fiber, also called dietary fiber, is a type of carbohydrate found in fruits, vegetables, whole grains, and beans. A high-fiber diet can have many health benefits. Your health care provider may recommend a high-fiber diet to help:  Prevent constipation. Fiber can make your bowel movements more regular.  Lower your cholesterol.  Relieve hemorrhoids, uncomplicated diverticulosis, or irritable bowel syndrome.  Prevent overeating as part of a weight-loss plan.  Prevent heart disease, type 2 diabetes, and certain cancers. What is my plan? The recommended daily intake of fiber includes:  38 grams for men under age 6.  64 grams for men over age 17.  69 grams for women under age 52.  31 grams for women over age 10. You can get the recommended daily intake of dietary fiber by eating a variety of fruits, vegetables, grains, and beans. Your health care provider may also recommend a fiber supplement if it is not possible to get enough fiber through your diet. What do I need to know about a high-fiber diet?  Fiber supplements have not been widely studied for their effectiveness, so it is better to get fiber through food sources.  Always check the fiber content on thenutrition facts label of any prepackaged food. Look for foods that contain at least 5 grams of fiber per serving.  Ask your dietitian if you have questions about specific foods that are related to your condition, especially if those foods are not listed in the following section.  Increase your daily fiber consumption gradually. Increasing your intake of dietary fiber too quickly may cause bloating, cramping, or gas.  Drink plenty of water. Water helps you to digest fiber. What foods can I eat? Grains  Whole-grain breads. Multigrain cereal. Oats and oatmeal. Brown rice. Barley. Bulgur wheat. [place]. Bran muffins. Popcorn. Rye wafer crackers. Vegetables  Sweet potatoes. Spinach. Kale. Artichokes. Cabbage. Broccoli.  Green peas. Carrots. Squash. Fruits  Berries. Pears. Apples. Oranges. Avocados. Prunes and raisins. Dried figs. Meats and Other Protein Sources  [place], kidney, pinto, and soy beans. Split peas. Lentils. Nuts and seeds. Dairy  Fiber-fortified yogurt. Beverages  Fiber-fortified soy milk. Fiber-fortified orange juice. Other  Fiber bars. The items listed above may not be a complete list of recommended foods or beverages. Contact your dietitian for more options.  What foods are not recommended? Grains  White bread. Pasta made with refined flour. White rice. Vegetables  Fried potatoes. Canned vegetables. Well-cooked vegetables. Fruits  Fruit juice. Cooked, strained fruit. Meats and Other Protein Sources  Fatty cuts of meat. Fried Sales executive or fried fish. Dairy  Milk. Yogurt. Cream cheese. Sour cream. Beverages  Soft drinks. Other  Cakes and pastries. Butter and oils. The items listed above may not be a complete list of foods and beverages to avoid. Contact your dietitian for more information.  What are some tips for including high-fiber foods in my diet?  Eat a wide variety of high-fiber foods.  Make sure that half of all grains consumed each day are whole grains.  Replace breads and cereals made from refined flour or white flour with whole-grain breads and cereals.  Replace white rice with brown rice, bulgur wheat, or millet.  Start the day with a breakfast that is high in fiber, such as a cereal that contains at least 5 grams of fiber per serving.  Use beans in place of meat in soups, salads, or pasta.  Eat high-fiber snacks, such as berries, raw vegetables, nuts, or popcorn. This information is not intended to replace  advice given to you by your health care provider. Make sure you discuss any questions you have with your health care provider. Document Released: 05/18/2005 Document Revised: 10/24/2015 Document Reviewed: 10/31/2013 Elsevier Interactive Patient Education  2017  [place]. Colonoscopy, Adult, Care After This sheet gives you information about how to care for yourself after your procedure. Your health care provider may also give you more specific instructions. If you have problems or questions, contact your health care provider. What can I expect after the procedure? After the procedure, it is common to have:  A small amount of blood in your stool for 24 hours after the procedure.  Some gas.  Mild abdominal cramping or bloating. Follow these instructions at home: General instructions  For the first 24 hours after the procedure:  Do not drive or use machinery.  Do not sign important documents.  Do not drink alcohol.  Do your regular daily activities at a slower pace than normal.  Eat soft, easy-to-digest foods.  Rest often.  Take over-the-counter or prescription medicines only as told by your health care provider.  It is up to you to get the results of your procedure. Ask your health care provider, or the department performing the procedure, when your results will be ready. Relieving cramping and bloating  Try walking around when you have cramps or feel bloated.  Apply heat to your abdomen as told by your health care provider. Use a heat source that your health care provider recommends, such as a moist heat pack or a heating pad.  Place a towel between your skin and the heat source.  Leave the heat on for 20-30 minutes.  Remove the heat if your skin turns bright red. This is especially important if you are unable to feel pain, heat, or cold. You may have a greater risk of getting burned. Eating and drinking  Drink enough fluid to keep your urine clear or pale yellow.  Resume your normal diet as instructed by your health care provider. Avoid heavy or fried foods that are hard to digest.  Avoid drinking alcohol for as long as instructed by your health care provider. Contact a health care provider if:  You have blood in your  stool 2-3 days after the procedure. Get help right away if:  You have more than a small spotting of blood in your stool.  Y    ou pass large blood clots in your stool.  Your abdomen is swollen.  You have nausea or vomiting.  You have a fever.  You have increasing abdominal pain that is not relieved with medicine. This information is not intended to replace advice given to you by your health care provider. Make sure you discuss any questions you have with your health care provider. Document Released: 12/31/2003 Document Revised: 02/10/2016 Document Reviewed: 07/30/2015 Elsevier Interactive Patient Education  2017 [place].    Resume aspirin on 04/23/2016. Resume other medications as before. High fiber diet. No driving for 24 hours. Physician will call with biopsy results.

## 2016-04-22 NOTE — H&P (Signed)
[name] is an 70 y.o. female.   Chief Complaint: Patient is here for colonoscopy. HPI: Patient is 70 year old Caucasian female was screening colonoscopy. She denies abdominal pain change in bowel habits or rectal bleeding. Last exam in May 2007 revealed pancolonic diverticulosis. Family History is negative for CRC. Patient had joint replacement in January this year and has been advised that she should get preprocedure antibiotic.  Past Medical History:  Diagnosis Date  . Anxiety   . Arthritis    "all over; mostly knees and shoulders"  (06/04/2015)   . Family history of adverse reaction to anesthesia    Patients mother had bad N/V after anesthesia  . GERD (gastroesophageal reflux disease)   . High cholesterol   . Hypertension   . Primary localized osteoarthritis of left knee     Past Surgical History:  Procedure Laterality Date  . APPENDECTOMY    . CHOLECYSTECTOMY OPEN     pain  . COLONOSCOPY W/ POLYPECTOMY    . JOINT REPLACEMENT    . KNEE ARTHROSCOPY W/ MENISCECTOMY Left   . KNEE SURGERY Left 1990s  . OOPHORECTOMY     for a cyst ? side  . TOTAL KNEE ARTHROPLASTY Left 06/03/2015  . TOTAL KNEE ARTHROPLASTY Left 06/03/2015   Procedure: TOTAL LEFT KNEE ARTHROPLASTY;  Surgeon: Elsie Saas, MD;  Location: [facility];  Service: Orthopedics;  Laterality: Left;  . TUBAL LIGATION      Family History  Problem Relation Age of Onset  . Heart disease Mother   . Diabetes Mellitus II Mother   . Kidney disease Mother   . Cancer Mother   . Hypertension Father   . Cancer Father   . Hypertension Brother   . Hypertension Sister   . Colon cancer Neg Hx    Social History:  reports that she has never smoked. She has never used smokeless tobacco. She reports that she does not drink alcohol or use drugs.  Allergies:  Allergies  Allergen Reactions  . Cefazolin Rash    Medications Prior to Admission  Medication Sig Dispense Refill  . ALPRAZolam (XANAX) 0.5 MG tablet Take 0.5 mg by mouth at  bedtime as needed for anxiety.    Marland Kitchen aspirin EC 81 MG tablet Take 81 mg by mouth daily.    Marland Kitchen atorvastatin (LIPITOR) 10 MG tablet Take 10 mg by mouth daily.    . Cholecalciferol (VITAMIN D-3) 5000 units TABS Take 1 tablet by mouth daily.    . diclofenac (VOLTAREN) 75 MG EC tablet Take 75 mg by mouth as needed.    Marland Kitchen losartan-hydrochlorothiazide (HYZAAR) 50-12.5 MG per tablet Take 1 tablet by mouth daily.    . montelukast (SINGULAIR) 10 MG tablet Take 10 mg by mouth at bedtime.    . pantoprazole (PROTONIX) 40 MG tablet Take 40 mg by mouth daily.    . Probiotic Product (ALIGN PO) Take 1 capsule by mouth daily.     . sucralfate (CARAFATE) 1 G tablet Take 1 g by mouth 4 (four) times daily as needed (for acid reflux).       No results found for this or any previous visit (from the past 48 hour(s)). No results found.  ROS  Blood pressure 137/71, pulse 80, temperature 98 F (36.7 C), temperature source Oral, resp. rate 11, height 5\' 5"  (1.651 m), weight 190 lb (86.2 kg), SpO2 99 %. Physical Exam  Constitutional: She appears well-developed and well-nourished.  HENT:  Mouth/Throat: Oropharynx is clear and moist.  Eyes: Conjunctivae are  normal. No scleral icterus.  Neck: No thyromegaly present.  Cardiovascular: Normal rate, regular rhythm and normal heart sounds.   No murmur heard. Respiratory: Effort normal and breath sounds normal.  GI: Soft. She exhibits no distension and no mass. There is no tenderness.  Musculoskeletal: She exhibits no edema.  Lymphadenopathy:    She has no cervical adenopathy.  Neurological: She is alert.  Skin: Skin is warm and dry.     Assessment/Plan Average risk screening colonoscopy.  Hildred Laser, MD 04/22/2016, 7:35 AM

## 2016-04-22 NOTE — Op Note (Signed)
[facility] Patient Name: [name] Procedure Date: 04/22/2016 7:27 AM MRN: [id number] Date of Birth: August 08, 1945 Attending MD: Hildred Laser , MD CSN: [id number] Age: 70 Admit Type: Outpatient Procedure:                Colonoscopy Indications:              Screening for colorectal malignant neoplasm Providers:                Hildred Laser, MD, Otis Peak B. Sharon Seller, RN, Isabella Stalling, Technician Referring MD:             Delphina Cahill, MD Medicines:                Meperidine 50 mg IV, Midazolam 7 mg IV Complications:            No immediate complications. Estimated Blood Loss:     Estimated blood loss was minimal. Procedure:                Pre-Anesthesia Assessment:                           - Prior to the procedure, a History and Physical                            was performed, and patient medications and                            allergies were reviewed. The patient's tolerance of                            previous anesthesia was also reviewed. The risks                            and benefits of the procedure and the sedation                            options and risks were discussed with the patient.                            All questions were answered, and informed consent                            was obtained. Prior Anticoagulants: The patient                            last took aspirin 3 days and previous NSAID                            medication 7 days prior to the procedure. ASA Grade                            Assessment: II - A patient with mild systemic  disease. After reviewing the risks and benefits,                            the patient was deemed in satisfactory condition to                            undergo the procedure.                           After obtaining informed consent, the colonoscope                            was passed under direct vision. Throughout the   procedure, the patient's blood pressure, pulse, and                            oxygen saturations were monitored continuously. The                            [id number]) scope was introduced through                            the anus and advanced to the the cecum, identified                            by appendiceal orifice and ileocecal valve. The                            colonoscopy was performed without difficulty. The                            patient tolerated the procedure well. The quality                            of the bowel preparation was adequate. The                            ileocecal valve, appendiceal orifice, and rectum                            were photographed. Scope In: 7:43:55 AM Scope Out: 8:02:58 AM Scope Withdrawal Time: 0 hours 9 minutes 34 seconds  Total Procedure Duration: 0 hours 19 minutes 3 seconds  Findings:      The perianal and digital rectal examinations were normal.      Two sessile polyps were found in the proximal transverse colon and       ascending colon. The polyps were 4 to 6 mm in size. These polyps were       removed with a cold snare. Resection and retrieval were complete. The       pathology specimen was placed into Bottle Number 1.      Multiple small-mouthed diverticula were found in the sigmoid colon and       descending colon.      External hemorrhoids were found during retroflexion. The hemorrhoids       were small. Impression:               -  Two 4 to 6 mm polyps in the proximal transverse                            colon and in the ascending colon, removed with a                            cold snare. Resected and retrieved.                           - Diverticulosis in the sigmoid colon and in the                            descending colon.                           - External hemorrhoids. Moderate Sedation:      Moderate (conscious) sedation was administered by the endoscopy nurse       and supervised by the  endoscopist. The following parameters were       monitored: oxygen saturation, heart rate, blood pressure, CO2       capnography and response to care. Total physician intraservice time was       22 minutes. Recommendation:           - Patient has a contact number available for                            emergencies. The signs and symptoms of potential                            delayed complications were discussed with the                            patient. Return to normal activities tomorrow.                            Written discharge instructions were provided to the                            patient.                           - High fiber diet today.                           - Continue present medications.                           - Resume aspirin at prior dose tomorrow.                           - Await pathology results.                           - Repeat colonoscopy for surveillance based on  pathology results. Procedure Code(s):        --- Professional ---                           [phone], Colonoscopy, flexible; with removal of                            tumor(s), polyp(s), or other lesion(s) by snare                            technique                           99152, Moderate sedation services provided by the                            same physician or other qualified health care                            professional performing the diagnostic or                            therapeutic service that the sedation supports,                            requiring the presence of an independent trained                            observer to assist in the monitoring of the                            patient's level of consciousness and physiological                            status; initial 15 minutes of intraservice time,                            patient age 61 years or older Diagnosis Code(s):        --- Professional ---                            Z12.11, Encounter for screening for malignant                            neoplasm of colon                           D12.3, Benign neoplasm of transverse colon (hepatic                            flexure or splenic flexure)                           D12.2, Benign neoplasm of ascending colon  K64.4, Residual hemorrhoidal skin tags                           K57.30, Diverticulosis of large intestine without                            perforation or abscess without bleeding CPT copyright 2016 American Medical Association. All rights reserved. The codes documented in this report are preliminary and upon coder review may  be revised to meet current compliance requirements. Hildred Laser, MD Hildred Laser, MD 04/22/2016 8:12:03 AM This report has been signed electronically. Number of Addenda: 0

## 2016-04-28 ENCOUNTER — Encounter (HOSPITAL_COMMUNITY): Payer: Self-pay | Admitting: Internal Medicine

## 2016-05-22 DIAGNOSIS — R1032 Left lower quadrant pain: Secondary | ICD-10-CM | POA: Diagnosis not present

## 2016-06-04 DIAGNOSIS — Z96652 Presence of left artificial knee joint: Secondary | ICD-10-CM | POA: Diagnosis not present

## 2016-06-10 DIAGNOSIS — E782 Mixed hyperlipidemia: Secondary | ICD-10-CM | POA: Diagnosis not present

## 2016-06-12 DIAGNOSIS — K219 Gastro-esophageal reflux disease without esophagitis: Secondary | ICD-10-CM | POA: Diagnosis not present

## 2016-06-12 DIAGNOSIS — H9209 Otalgia, unspecified ear: Secondary | ICD-10-CM | POA: Diagnosis not present

## 2016-06-12 DIAGNOSIS — E785 Hyperlipidemia, unspecified: Secondary | ICD-10-CM | POA: Diagnosis not present

## 2016-06-12 DIAGNOSIS — I10 Essential (primary) hypertension: Secondary | ICD-10-CM | POA: Diagnosis not present

## 2016-06-12 DIAGNOSIS — Z0001 Encounter for general adult medical examination with abnormal findings: Secondary | ICD-10-CM | POA: Diagnosis not present

## 2016-06-12 DIAGNOSIS — F5101 Primary insomnia: Secondary | ICD-10-CM | POA: Diagnosis not present

## 2016-07-23 ENCOUNTER — Ambulatory Visit (INDEPENDENT_AMBULATORY_CARE_PROVIDER_SITE_OTHER): Payer: Medicare Other | Admitting: Otolaryngology

## 2016-07-23 ENCOUNTER — Other Ambulatory Visit: Payer: Self-pay | Admitting: Otolaryngology

## 2016-07-23 DIAGNOSIS — L723 Sebaceous cyst: Secondary | ICD-10-CM | POA: Diagnosis not present

## 2016-07-24 ENCOUNTER — Other Ambulatory Visit: Payer: Self-pay

## 2016-07-24 ENCOUNTER — Encounter (HOSPITAL_BASED_OUTPATIENT_CLINIC_OR_DEPARTMENT_OTHER): Payer: Self-pay | Admitting: *Deleted

## 2016-07-24 ENCOUNTER — Encounter (HOSPITAL_COMMUNITY)
Admission: RE | Admit: 2016-07-24 | Discharge: 2016-07-24 | Disposition: A | Discharge status: Home / Self Care | Payer: Medicare Other | Source: Ambulatory Visit | Attending: Otolaryngology | Admitting: Otolaryngology

## 2016-07-24 DIAGNOSIS — M1712 Unilateral primary osteoarthritis, left knee: Secondary | ICD-10-CM | POA: Diagnosis not present

## 2016-07-24 DIAGNOSIS — L720 Epidermal cyst: Secondary | ICD-10-CM | POA: Diagnosis not present

## 2016-07-24 DIAGNOSIS — K219 Gastro-esophageal reflux disease without esophagitis: Secondary | ICD-10-CM | POA: Diagnosis not present

## 2016-07-24 DIAGNOSIS — I10 Essential (primary) hypertension: Secondary | ICD-10-CM | POA: Diagnosis not present

## 2016-07-24 DIAGNOSIS — E7800 Pure hypercholesterolemia, unspecified: Secondary | ICD-10-CM | POA: Diagnosis not present

## 2016-07-24 DIAGNOSIS — F419 Anxiety disorder, unspecified: Secondary | ICD-10-CM | POA: Diagnosis not present

## 2016-07-24 DIAGNOSIS — L723 Sebaceous cyst: Secondary | ICD-10-CM | POA: Diagnosis present

## 2016-07-24 LAB — BASIC METABOLIC PANEL
ANION GAP: 6 (ref 5–15)
BUN: 15 mg/dL (ref 6–20)
CO2: 28 mmol/L (ref 22–32)
Calcium: 9.2 mg/dL (ref 8.9–10.3)
Chloride: 103 mmol/L (ref 101–111)
Creatinine, Ser: 0.7 mg/dL (ref 0.44–1.00)
GLUCOSE: 99 mg/dL (ref 65–99)
POTASSIUM: 3.6 mmol/L (ref 3.5–5.1)
Sodium: 137 mmol/L (ref 135–145)

## 2016-07-27 ENCOUNTER — Ambulatory Visit (HOSPITAL_BASED_OUTPATIENT_CLINIC_OR_DEPARTMENT_OTHER)
Admission: RE | Admit: 2016-07-27 | Discharge: 2016-07-27 | Disposition: A | Discharge status: Home / Self Care | Payer: Medicare Other | Source: Ambulatory Visit | Attending: Otolaryngology | Admitting: Otolaryngology

## 2016-07-27 ENCOUNTER — Encounter (HOSPITAL_BASED_OUTPATIENT_CLINIC_OR_DEPARTMENT_OTHER)
Admission: RE | Disposition: A | Discharge status: Home / Self Care | Payer: Self-pay | Source: Ambulatory Visit | Attending: Otolaryngology

## 2016-07-27 ENCOUNTER — Encounter (HOSPITAL_BASED_OUTPATIENT_CLINIC_OR_DEPARTMENT_OTHER): Payer: Self-pay | Admitting: *Deleted

## 2016-07-27 ENCOUNTER — Ambulatory Visit (HOSPITAL_BASED_OUTPATIENT_CLINIC_OR_DEPARTMENT_OTHER): Payer: Medicare Other | Admitting: Anesthesiology

## 2016-07-27 DIAGNOSIS — F419 Anxiety disorder, unspecified: Secondary | ICD-10-CM | POA: Insufficient documentation

## 2016-07-27 DIAGNOSIS — L720 Epidermal cyst: Secondary | ICD-10-CM | POA: Diagnosis not present

## 2016-07-27 DIAGNOSIS — K219 Gastro-esophageal reflux disease without esophagitis: Secondary | ICD-10-CM | POA: Insufficient documentation

## 2016-07-27 DIAGNOSIS — L729 Follicular cyst of the skin and subcutaneous tissue, unspecified: Secondary | ICD-10-CM | POA: Diagnosis not present

## 2016-07-27 DIAGNOSIS — L723 Sebaceous cyst: Secondary | ICD-10-CM | POA: Diagnosis not present

## 2016-07-27 DIAGNOSIS — D234 Other benign neoplasm of skin of scalp and neck: Secondary | ICD-10-CM | POA: Diagnosis not present

## 2016-07-27 DIAGNOSIS — M1712 Unilateral primary osteoarthritis, left knee: Secondary | ICD-10-CM | POA: Diagnosis not present

## 2016-07-27 DIAGNOSIS — I10 Essential (primary) hypertension: Secondary | ICD-10-CM | POA: Diagnosis not present

## 2016-07-27 DIAGNOSIS — E7800 Pure hypercholesterolemia, unspecified: Secondary | ICD-10-CM | POA: Insufficient documentation

## 2016-07-27 HISTORY — PX: MASS EXCISION: SHX2000

## 2016-07-27 SURGERY — EXCISION MASS
Anesthesia: Monitor Anesthesia Care | Site: Neck

## 2016-07-27 MED ORDER — AMOXICILLIN 875 MG PO TABS: 875.0000 mg | ORAL_TABLET | Freq: Two times a day (BID) | ORAL | 0 refills | Status: AC

## 2016-07-27 MED ORDER — LACTATED RINGERS IV SOLN
INTRAVENOUS | Status: DC
  Administered 2016-07-27: 08:00:00 via INTRAVENOUS

## 2016-07-27 MED ORDER — LIDOCAINE-EPINEPHRINE 2 %-1:100000 IJ SOLN
INTRAMUSCULAR | Status: DC | PRN
  Administered 2016-07-27: 1.7 mL via INTRADERMAL

## 2016-07-27 MED ORDER — ONDANSETRON HCL 4 MG/2ML IJ SOLN
INTRAMUSCULAR | Status: DC | PRN
  Administered 2016-07-27: 4 mg via INTRAVENOUS

## 2016-07-27 MED ORDER — LIDOCAINE 2% (20 MG/ML) 5 ML SYRINGE
INTRAMUSCULAR | Status: DC | PRN
  Administered 2016-07-27: 50 mg via INTRAVENOUS

## 2016-07-27 MED ORDER — FENTANYL CITRATE (PF) 100 MCG/2ML IJ SOLN
INTRAMUSCULAR | Status: AC
  Filled 2016-07-27: qty 2

## 2016-07-27 MED ORDER — MIDAZOLAM HCL 2 MG/2ML IJ SOLN
INTRAMUSCULAR | Status: AC
Start: 2016-07-27 — End: 2016-07-27
  Filled 2016-07-27: qty 2

## 2016-07-27 MED ORDER — CEFAZOLIN SODIUM-DEXTROSE 2-4 GM/100ML-% IV SOLN
2.0000 g | Freq: Once | INTRAVENOUS | Status: AC
  Administered 2016-07-27: 2 g via INTRAVENOUS

## 2016-07-27 MED ORDER — FENTANYL CITRATE (PF) 100 MCG/2ML IJ SOLN: 25.0000 ug | INTRAMUSCULAR | Status: DC | PRN

## 2016-07-27 MED ORDER — MEPERIDINE HCL 25 MG/ML IJ SOLN: 6.2500 mg | INTRAMUSCULAR | Status: DC | PRN

## 2016-07-27 MED ORDER — SCOPOLAMINE 1 MG/3DAYS TD PT72: 1.0000 | MEDICATED_PATCH | Freq: Once | TRANSDERMAL | Status: DC | PRN

## 2016-07-27 MED ORDER — LIDOCAINE 2% (20 MG/ML) 5 ML SYRINGE
INTRAMUSCULAR | Status: AC
  Filled 2016-07-27: qty 5

## 2016-07-27 MED ORDER — OXYCODONE-ACETAMINOPHEN 5-325 MG PO TABS: 1.0000 | ORAL_TABLET | Freq: Four times a day (QID) | ORAL | 0 refills | Status: DC | PRN

## 2016-07-27 MED ORDER — METOCLOPRAMIDE HCL 5 MG/ML IJ SOLN: 10.0000 mg | Freq: Once | INTRAMUSCULAR | Status: DC | PRN

## 2016-07-27 MED ORDER — ONDANSETRON HCL 4 MG/2ML IJ SOLN
INTRAMUSCULAR | Status: AC
  Filled 2016-07-27: qty 2

## 2016-07-27 MED ORDER — PROPOFOL 10 MG/ML IV BOLUS
INTRAVENOUS | Status: DC | PRN
  Administered 2016-07-27: 20 mg via INTRAVENOUS
  Administered 2016-07-27 (×2): 10 mg via INTRAVENOUS

## 2016-07-27 MED ORDER — CEFAZOLIN SODIUM-DEXTROSE 2-4 GM/100ML-% IV SOLN
INTRAVENOUS | Status: AC
  Filled 2016-07-27: qty 100

## 2016-07-27 MED ORDER — LIDOCAINE-EPINEPHRINE 2 %-1:100000 IJ SOLN
INTRAMUSCULAR | Status: AC
  Filled 2016-07-27: qty 1.7

## 2016-07-27 MED ORDER — FENTANYL CITRATE (PF) 100 MCG/2ML IJ SOLN
50.0000 ug | INTRAMUSCULAR | Status: DC | PRN
  Administered 2016-07-27: 50 ug via INTRAVENOUS

## 2016-07-27 MED ORDER — MIDAZOLAM HCL 2 MG/2ML IJ SOLN
1.0000 mg | INTRAMUSCULAR | Status: DC | PRN
  Administered 2016-07-27: 2 mg via INTRAVENOUS

## 2016-07-27 SURGICAL SUPPLY — 60 items
ADH SKN CLS APL DERMABOND .7 (GAUZE/BANDAGES/DRESSINGS) ×1
ATTRACTOMAT 16X20 MAGNETIC DRP (DRAPES) IMPLANT
BLADE SURG 15 STRL LF DISP TIS (BLADE) IMPLANT
BLADE SURG 15 STRL SS (BLADE)
CANISTER SUCT 1200ML W/VALVE (MISCELLANEOUS) ×2 IMPLANT
CORDS BIPOLAR (ELECTRODE) IMPLANT
COVER BACK TABLE 60X90IN (DRAPES) ×2 IMPLANT
COVER MAYO STAND STRL (DRAPES) ×2 IMPLANT
DECANTER SPIKE VIAL GLASS SM (MISCELLANEOUS) IMPLANT
DERMABOND ADVANCED (GAUZE/BANDAGES/DRESSINGS) ×1
DERMABOND ADVANCED .7 DNX12 (GAUZE/BANDAGES/DRESSINGS) ×1 IMPLANT
DRAIN JACKSON RD 7FR 3/32 (WOUND CARE) IMPLANT
DRAIN PENROSE 1/4X12 LTX STRL (WOUND CARE) IMPLANT
DRAIN TLS ROUND 10FR (DRAIN) IMPLANT
DRAPE U-SHAPE 76X120 STRL (DRAPES) ×2 IMPLANT
ELECT COATED BLADE 2.86 ST (ELECTRODE) ×2 IMPLANT
ELECT NDL BLADE 2-5/6 (NEEDLE) IMPLANT
ELECT NEEDLE BLADE 2-5/6 (NEEDLE) IMPLANT
ELECT PAIRED SUBDERMAL (MISCELLANEOUS)
ELECT REM PT RETURN 9FT ADLT (ELECTROSURGICAL) ×2
ELECTRODE PAIRED SUBDERMAL (MISCELLANEOUS) IMPLANT
ELECTRODE REM PT RTRN 9FT ADLT (ELECTROSURGICAL) ×1 IMPLANT
EVACUATOR SILICONE 100CC (DRAIN) IMPLANT
FORCEPS BIPOLAR SPETZLER 8 1.0 (NEUROSURGERY SUPPLIES) IMPLANT
GAUZE SPONGE 4X4 16PLY XRAY LF (GAUZE/BANDAGES/DRESSINGS) IMPLANT
GLOVE BIO SURGEON STRL SZ 6.5 (GLOVE) ×1 IMPLANT
GLOVE BIO SURGEON STRL SZ7.5 (GLOVE) ×2 IMPLANT
GOWN STRL REUS W/ TWL LRG LVL3 (GOWN DISPOSABLE) ×2 IMPLANT
GOWN STRL REUS W/TWL LRG LVL3 (GOWN DISPOSABLE) ×4
HEMOSTAT SURGICEL 2X14 (HEMOSTASIS) IMPLANT
LOCATOR NERVE 3 VOLT (DISPOSABLE) IMPLANT
NDL HYPO 25X1 1.5 SAFETY (NEEDLE) ×1 IMPLANT
NEEDLE HYPO 25X1 1.5 SAFETY (NEEDLE) ×2 IMPLANT
NS IRRIG 1000ML POUR BTL (IV SOLUTION) ×2 IMPLANT
PACK BASIN DAY SURGERY FS (CUSTOM PROCEDURE TRAY) ×2 IMPLANT
PENCIL BUTTON HOLSTER BLD 10FT (ELECTRODE) ×2 IMPLANT
PIN SAFETY STERILE (MISCELLANEOUS) IMPLANT
PROBE NERVBE PRASS .33 (MISCELLANEOUS) IMPLANT
SHEARS HARMONIC 9CM CVD (BLADE) IMPLANT
SLEEVE SCD COMPRESS KNEE MED (MISCELLANEOUS) IMPLANT
SPONGE GAUZE 2X2 8PLY STRL LF (GAUZE/BANDAGES/DRESSINGS) IMPLANT
SPONGE GAUZE 4X4 12PLY STER LF (GAUZE/BANDAGES/DRESSINGS) IMPLANT
SUCTION FRAZIER HANDLE 10FR (MISCELLANEOUS)
SUCTION TUBE FRAZIER 10FR DISP (MISCELLANEOUS) IMPLANT
SUT ETHILON 3 0 PS 1 (SUTURE) IMPLANT
SUT ETHILON 5 0 P 3 18 (SUTURE)
SUT NYLON ETHILON 5-0 P-3 1X18 (SUTURE) IMPLANT
SUT PROLENE 4 0 P 3 18 (SUTURE) IMPLANT
SUT SILK 3 0 TIES 17X18 (SUTURE)
SUT SILK 3-0 18XBRD TIE BLK (SUTURE) IMPLANT
SUT SILK 4 0 TIES 17X18 (SUTURE) IMPLANT
SUT VIC AB 3-0 FS2 27 (SUTURE) IMPLANT
SUT VIC AB 4-0 P-3 18XBRD (SUTURE) IMPLANT
SUT VIC AB 4-0 P3 18 (SUTURE) ×2
SUT VICRYL 4-0 PS2 18IN ABS (SUTURE) ×2 IMPLANT
SYR BULB 3OZ (MISCELLANEOUS) ×2 IMPLANT
SYR CONTROL 10ML LL (SYRINGE) ×2 IMPLANT
TOWEL OR 17X24 6PK STRL BLUE (TOWEL DISPOSABLE) ×2 IMPLANT
TUBE CONNECTING 20X1/4 (TUBING) ×2 IMPLANT
YANKAUER SUCT BULB TIP NO VENT (SUCTIONS) IMPLANT

## 2016-07-27 NOTE — Anesthesia Preprocedure Evaluation (Signed)
Anesthesia Evaluation  Patient identified by MRN, date of birth, ID band Patient awake    Reviewed: Allergy & Precautions, NPO status , Patient's Chart, lab work & pertinent test results  Airway Mallampati: II  TM Distance: >3 FB Neck ROM: Full    Dental  (+) Partial Upper   Pulmonary neg pulmonary ROS,    Pulmonary exam normal breath sounds clear to auscultation       Cardiovascular hypertension, Pt. on medications Normal cardiovascular exam Rhythm:Regular Rate:Normal     Neuro/Psych negative neurological ROS  negative psych ROS   GI/Hepatic Neg liver ROS, GERD  Medicated and Controlled,  Endo/Other  negative endocrine ROS  Renal/GU negative Renal ROS  negative genitourinary   Musculoskeletal negative musculoskeletal ROS (+)   Abdominal   Peds negative pediatric ROS (+)  Hematology negative hematology ROS (+)   Anesthesia Other Findings   Reproductive/Obstetrics negative OB ROS                             Anesthesia Physical Anesthesia Plan  ASA: II  Anesthesia Plan: MAC   Post-op Pain Management:    Induction: Intravenous  Airway Management Planned: Simple Face Mask  Additional Equipment:   Intra-op Plan:   Post-operative Plan:   Informed Consent: I have reviewed the patients History and Physical, chart, labs and discussed the procedure including the risks, benefits and alternatives for the proposed anesthesia with the patient or authorized representative who has indicated his/her understanding and acceptance.   Dental advisory given  Plan Discussed with: CRNA  Anesthesia Plan Comments:         Anesthesia Quick Evaluation

## 2016-07-27 NOTE — Anesthesia Postprocedure Evaluation (Signed)
Anesthesia Post Note  Patient: [name]  Procedure(s) Performed: Procedure(s) (LRB): EXCISION OF SEBACEOUS CYST ON NECK (N/A)  Patient location during evaluation: PACU Anesthesia Type: MAC Level of consciousness: awake and alert Pain management: pain level controlled Vital Signs Assessment: post-procedure vital signs reviewed and stable Respiratory status: spontaneous breathing, nonlabored ventilation, respiratory function stable and patient connected to nasal cannula oxygen Cardiovascular status: stable and blood pressure returned to baseline Anesthetic complications: no       Last Vitals:  Vitals:   07/27/16 0923 07/27/16 0943  BP: (!) 119/59 127/60  Pulse: 75 69  Resp: 16 18  Temp:  36.7 C    Last Pain:  Vitals:   07/27/16 0943  TempSrc: Oral  PainSc: 0-No pain                 Montez Hageman

## 2016-07-27 NOTE — Transfer of Care (Signed)
Immediate Anesthesia Transfer of Care Note  Patient: [name]  Procedure(s) Performed: Procedure(s): EXCISION OF SEBACEOUS CYST ON NECK (N/A)  Patient Location: PACU  Anesthesia Type:MAC  Level of Consciousness: awake and alert   Airway & Oxygen Therapy: Patient Spontanous Breathing and Patient connected to face mask oxygen  Post-op Assessment: Report given to RN and Post -op Vital signs reviewed and stable  Post vital signs: Reviewed and stable  Last Vitals:  Vitals:   07/27/16 0808  BP: (!) 144/68  Pulse: 77  Resp: 18  Temp: 36.8 C    Last Pain:  Vitals:   07/27/16 0808  TempSrc: Oral         Complications: No apparent anesthesia complications

## 2016-07-27 NOTE — Op Note (Addendum)
DATE OF PROCEDURE: 07/27/2016  OPERATIVE REPORT   SURGEON: Leta Baptist, MD  PREOPERATIVE DIAGNOSIS: Left superficial neck mass  POSTOPERATIVE DIAGNOSIS: Left superficial neck mass  PROCEDURES PERFORMED: 1. Excision of left superficial neck mass, 3cm incision (CPT 11423)  ANESTHESIA: MAC, and local anesthesia with 2% lidocaine  COMPLICATIONS: None.  ESTIMATED BLOOD LOSS: Minimal.  INDICATION FOR PROCEDURE:  [name] is a 71 y.o. female who noted a superficial mass on her left neck 2 months ago. She tried to pop it but the area only increased in size and became very red. She took an antibiotic and the size has decreased.  The mass was still bothersome to the patient. She would like to have the mass removed. Based on the above findings, the decision was made for the patient to undergo the above-stated procedure. The risks, benefits, alternatives, and details of the procedure were discussed with the patient. Questions were invited and answered. Informed consent was obtained.  DESCRIPTION OF PROCEDURE: The patient was taken to the operating room and placed supine on the operating table.IV sedation was administered by the anesthesiologist. The patient was positioned and prepped and draped in a standard fashion for anterior neck surgery.  2% lidocaine with 1-100,000 epinephrine was infiltrated around the left anterior neck mass. The mass measured 1 cm in diameter. A 3 cm elliptical incision was made around the neck mass. The incision was carried down to the subcutaneous level. The entire mass was excised. The specimen was sent to the pathology department for permanent histologic identification. The surgical site was copiously irrigated. The incision was closed in layers with 4-0 Vicryl and Dermabond.  That concluded the procedure for the patient. The care of the patient was turned over to the anesthesiologist. The patient was awakened from anesthesia without difficulty. She was  transferred to the recovery room in good condition.  OPERATIVE FINDINGS: A superficial cutaneous neck mass was removed. A 3cm elliptical incision was made.   SPECIMEN: Anterior superficial neck mass.  FOLLOWUP CARE: The patient will be discharged home once she is awake and alert. She will follow up in my office in 1 week.

## 2016-07-27 NOTE — Anesthesia Procedure Notes (Signed)
Procedure Name: MAC Date/Time: 07/27/2016 8:51 AM Performed by: Lieutenant Diego Pre-anesthesia Checklist: Patient identified, Timeout performed, Emergency Drugs available, Suction available and Patient being monitored Patient Re-evaluated:Patient Re-evaluated prior to inductionOxygen Delivery Method: Simple face mask Intubation Type: IV induction

## 2016-07-27 NOTE — Discharge Instructions (Signed)
°  Post Anesthesia Home Care Instructions  Activity: Get plenty of rest for the remainder of the day. A responsible adult should stay with you for 24 hours following the procedure.  For the next 24 hours, DO NOT: -Drive a car -Paediatric nurse -Drink alcoholic beverages -Take any medication unless instructed by your physician -Make any legal decisions or sign important papers.  Meals: Start with liquid foods such as gelatin or soup. Progress to regular foods as tolerated. Avoid greasy, spicy, heavy foods. If nausea and/or vomiting occur, drink only clear liquids until the nausea and/or vomiting subsides. Call your physician if vomiting continues.  Special Instructions/Symptoms: Your throat may feel dry or sore from the anesthesia or the breathing tube placed in your throat during surgery. If this causes discomfort, gargle with warm salt water. The discomfort should disappear within 24 hours.  If you had a scopolamine patch placed behind your ear for the management of post- operative nausea and/or vomiting:  1. The medication in the patch is effective for 72 hours, after which it should be removed.  Wrap patch in a tissue and discard in the trash. Wash hands thoroughly with soap and water. 2. You may remove the patch earlier than 72 hours if you experience unpleasant side effects which may include dry mouth, dizziness or visual disturbances. 3. Avoid touching the patch. Wash your hands with soap and water after contact with the patch.   ----------------  The patient may resume all her previous activities and diet. She will follow-up in my [place] office in one week.

## 2016-07-27 NOTE — H&P (Signed)
Cc: Neck mass  HPI: The patient is a 71 y/o female who presents today with her husband for evaluation of a neck mass. The patient noticed on bump on her neck 5 weeks ago. She tried to pop it but the area only increased in size and became very red. She took an antibiotic and the size has decreased significantly. The area is still bothersome for the patient. No pain or dysphagia is noted. No previous ENT surgery is noted.   Exam General: Communicates without difficulty, well nourished, no acute distress. Head: Normocephalic, no evidence injury, no tenderness, facial buttresses intact without stepoff. Eyes: PERRL, EOMI. No scleral icterus, conjunctivae clear. Neuro: CN II exam reveals vision grossly intact.  No nystagmus at any point of gaze. Ears: Auricles well formed without lesions.  Ear canals are intact without mass or lesion.  No erythema or edema is appreciated.  The TMs are intact without fluid. Nose: External evaluation reveals normal support and skin without lesions.  Dorsum is intact.  Anterior rhinoscopy reveals healthy pink mucosa over anterior aspect of inferior turbinates and intact septum.  No purulence noted. Oral:  Oral cavity and oropharynx are intact, symmetric, without erythema or edema.  Mucosa is moist without lesions. Neck: Full range of motion without pain.  There is no significant lymphadenopathy. A 1 cm superficial neck mass is noted.  Thyroid bed within normal limits to palpation.  Parotid glands and submandibular glands equal bilaterally without mass.  Trachea is midline.   Assessment A 1 cm superficial neck mass is noted without evidence of acute infection.   Plan  1.  The physical exam findings are reviewed with the patient.  2.  Treatment options include observation versus excision. The risks, benefits, alternatives, and details of the procedure are reviewed with the patient. Questions are invited and answered. 3.  The patient  is interested in proceeding with the  procedure.  We will schedule the procedure in accordance with the family schedule.

## 2016-07-28 ENCOUNTER — Encounter (HOSPITAL_BASED_OUTPATIENT_CLINIC_OR_DEPARTMENT_OTHER): Payer: Self-pay | Admitting: Otolaryngology

## 2016-08-03 ENCOUNTER — Ambulatory Visit (INDEPENDENT_AMBULATORY_CARE_PROVIDER_SITE_OTHER): Payer: Medicare Other | Admitting: Otolaryngology

## 2016-08-03 DIAGNOSIS — H35361 Drusen (degenerative) of macula, right eye: Secondary | ICD-10-CM | POA: Diagnosis not present

## 2016-11-17 DIAGNOSIS — H6502 Acute serous otitis media, left ear: Secondary | ICD-10-CM | POA: Diagnosis not present

## 2016-12-15 DIAGNOSIS — Z1159 Encounter for screening for other viral diseases: Secondary | ICD-10-CM | POA: Diagnosis not present

## 2016-12-15 DIAGNOSIS — I10 Essential (primary) hypertension: Secondary | ICD-10-CM | POA: Diagnosis not present

## 2016-12-18 DIAGNOSIS — I10 Essential (primary) hypertension: Secondary | ICD-10-CM | POA: Diagnosis not present

## 2016-12-18 DIAGNOSIS — F5101 Primary insomnia: Secondary | ICD-10-CM | POA: Diagnosis not present

## 2016-12-18 DIAGNOSIS — K219 Gastro-esophageal reflux disease without esophagitis: Secondary | ICD-10-CM | POA: Diagnosis not present

## 2016-12-18 DIAGNOSIS — E785 Hyperlipidemia, unspecified: Secondary | ICD-10-CM | POA: Diagnosis not present

## 2016-12-18 DIAGNOSIS — H9209 Otalgia, unspecified ear: Secondary | ICD-10-CM | POA: Diagnosis not present

## 2017-01-03 DIAGNOSIS — S161XXA Strain of muscle, fascia and tendon at neck level, initial encounter: Secondary | ICD-10-CM | POA: Diagnosis not present

## 2017-02-04 DIAGNOSIS — M19011 Primary osteoarthritis, right shoulder: Secondary | ICD-10-CM | POA: Diagnosis not present

## 2017-02-04 DIAGNOSIS — M542 Cervicalgia: Secondary | ICD-10-CM | POA: Diagnosis not present

## 2017-02-04 DIAGNOSIS — M19012 Primary osteoarthritis, left shoulder: Secondary | ICD-10-CM | POA: Diagnosis not present

## 2017-02-26 ENCOUNTER — Encounter (INDEPENDENT_AMBULATORY_CARE_PROVIDER_SITE_OTHER): Payer: Self-pay

## 2017-02-26 ENCOUNTER — Encounter (INDEPENDENT_AMBULATORY_CARE_PROVIDER_SITE_OTHER): Payer: Self-pay | Admitting: Internal Medicine

## 2017-03-23 DIAGNOSIS — Z23 Encounter for immunization: Secondary | ICD-10-CM | POA: Diagnosis not present

## 2017-03-24 DIAGNOSIS — J019 Acute sinusitis, unspecified: Secondary | ICD-10-CM | POA: Diagnosis not present

## 2017-04-08 ENCOUNTER — Encounter (INDEPENDENT_AMBULATORY_CARE_PROVIDER_SITE_OTHER): Payer: Self-pay | Admitting: Internal Medicine

## 2017-04-08 ENCOUNTER — Ambulatory Visit (INDEPENDENT_AMBULATORY_CARE_PROVIDER_SITE_OTHER): Payer: Medicare Other | Admitting: Internal Medicine

## 2017-04-08 VITALS — BP 172/84 | HR 68 | Temp 98.1°F | Ht 65.0 in | Wt 182.0 lb

## 2017-04-08 DIAGNOSIS — K219 Gastro-esophageal reflux disease without esophagitis: Secondary | ICD-10-CM

## 2017-04-08 NOTE — Progress Notes (Signed)
   Subjective:    Patient ID: [name], female    DOB: [birth year], 71 y.o.   MRN: [id number]  HPI Here today for f/u.  Hx of chronic GERD controlled with Protonix. Underwent a colonoscopy in November of 2017 (screening)   Two 4-6 mm polyps removed. Biopsy Tubular adenoma. Next colonoscopy in 5 yrs. She tells me she is doing good. She occasionally has some left lower abdominal pain. Appetite is good. She has lost about 7 pounds since her last visit. BMs are normal. She occasionally has some bloating    Review of Systems Past Medical History:  Diagnosis Date  . Anxiety   . Arthritis    "all over; mostly knees and shoulders"  (06/04/2015)   . Family history of adverse reaction to anesthesia    Patients mother had bad N/V after anesthesia  . GERD (gastroesophageal reflux disease)   . High cholesterol   . Hypertension   . Primary localized osteoarthritis of left knee     Past Surgical History:  Procedure Laterality Date  . APPENDECTOMY    . CHOLECYSTECTOMY OPEN     pain  . COLONOSCOPY W/ POLYPECTOMY    . JOINT REPLACEMENT    . KNEE ARTHROSCOPY W/ MENISCECTOMY Left   . KNEE SURGERY Left 1990s  . OOPHORECTOMY     for a cyst ? side  . TOTAL KNEE ARTHROPLASTY Left 06/03/2015  . TUBAL LIGATION      No Known Allergies  Current Outpatient Medications on File Prior to Visit  Medication Sig Dispense Refill  . ALPRAZolam (XANAX) 0.5 MG tablet Take 0.5 mg by mouth at bedtime as needed for anxiety.    Marland Kitchen aspirin EC 81 MG tablet Take 1 tablet (81 mg total) by mouth daily.    Marland Kitchen atorvastatin (LIPITOR) 10 MG tablet Take 10 mg by mouth daily.    . Cholecalciferol (VITAMIN D-3) 5000 units TABS Take 1 tablet by mouth daily.    . diclofenac (VOLTAREN) 75 MG EC tablet Take 75 mg by mouth as needed.    Marland Kitchen losartan-hydrochlorothiazide (HYZAAR) 50-12.5 MG per tablet Take 1 tablet by mouth daily.    . montelukast (SINGULAIR) 10 MG tablet Take 10 mg by mouth at bedtime.    . pantoprazole (PROTONIX) 40 MG  tablet Take 40 mg by mouth daily.    . Probiotic Product (ALIGN PO) Take 1 capsule by mouth daily.     . sucralfate (CARAFATE) 1 G tablet Take 1 g by mouth 4 (four) times daily as needed (for acid reflux).      No current facility-administered medications on file prior to visit.         Objective:   Physical Exam Blood pressure (!) 172/84, pulse 68, temperature 98.1 F (36.7 C), height 5\' 5"  (1.651 m), weight 182 lb (82.6 kg). Alert and oriented. Skin warm and dry. Oral mucosa is moist.   . Sclera anicteric, conjunctivae is pink. Thyroid not enlarged. No cervical lymphadenopathy. Lungs clear. Heart regular rate and rhythm.  Abdomen is soft. Bowel sounds are positive. No hepatomegaly. No abdominal masses felt. No tenderness.  No edema to lower extremities.           Assessment & Plan:  GERD, continue the Protonix.  OV in 1 year.

## 2017-04-08 NOTE — Patient Instructions (Addendum)
Gastroesophageal Reflux Disease, Adult Normally, food travels down the esophagus and stays in the stomach to be digested. If a person has gastroesophageal reflux disease (GERD), food and stomach acid move back up into the esophagus. When this happens, the esophagus becomes sore and swollen (inflamed). Over time, GERD can make small holes (ulcers) in the lining of the esophagus. Follow these instructions at home: Diet  Follow a diet as told by your doctor. You may need to avoid foods and drinks such as: ? Coffee and tea (with or without caffeine). ? Drinks that contain alcohol. ? Energy drinks and sports drinks. ? Carbonated drinks or sodas. ? Chocolate and cocoa. ? Peppermint and mint flavorings. ? Garlic and onions. ? Horseradish. ? Spicy and acidic foods, such as peppers, chili powder, curry powder, vinegar, hot sauces, and BBQ sauce. ? Citrus fruit juices and citrus fruits, such as oranges, lemons, and limes. ? Tomato-based foods, such as red sauce, chili, salsa, and pizza with red sauce. ? Fried and fatty foods, such as donuts, french fries, potato chips, and high-fat dressings. ? High-fat meats, such as hot dogs, rib eye steak, sausage, ham, and bacon. ? High-fat dairy items, such as whole milk, butter, and cream cheese.  Eat small meals often. Avoid eating large meals.  Avoid drinking large amounts of liquid with your meals.  Avoid eating meals during the 2-3 hours before bedtime.  Avoid lying down right after you eat.  Do not exercise right after you eat. General instructions  Pay attention to any changes in your symptoms.  Take over-the-counter and prescription medicines only as told by your doctor. Do not take aspirin, ibuprofen, or other NSAIDs unless your doctor says it is okay.  Do not use any tobacco products, including cigarettes, chewing tobacco, and e-cigarettes. If you need help quitting, ask your doctor.  Wear loose clothes. Do not wear anything tight around  your waist.  Raise (elevate) the head of your bed about 6 inches (15 cm).  Try to lower your stress. If you need help doing this, ask your doctor.  If you are overweight, lose an amount of weight that is healthy for you. Ask your doctor about a safe weight loss goal.  Keep all follow-up visits as told by your doctor. This is important. Contact a doctor if:  You have new symptoms.  You lose weight and you do not know why it is happening.  You have trouble swallowing, or it hurts to swallow.  You have wheezing or a cough that keeps happening.  Your symptoms do not get better with treatment.  You have a hoarse voice. Get help right away if:  You have pain in your arms, neck, jaw, teeth, or back.  You feel sweaty, dizzy, or light-headed.  You have chest pain or shortness of breath.  You throw up (vomit) and your throw up looks like blood or coffee grounds.  You pass out (faint).  Your poop (stool) is bloody or black.  You cannot swallow, drink, or eat. This information is not intended to replace advice given to you by your health care provider. Make sure you discuss any questions you have with your health care provider. Document Released: 11/04/2007 Document Revised: 10/24/2015 Document Reviewed: 09/12/2014 Elsevier Interactive Patient Education  2018 [place]. OV in 1 year.

## 2017-06-17 DIAGNOSIS — E785 Hyperlipidemia, unspecified: Secondary | ICD-10-CM | POA: Diagnosis not present

## 2017-06-17 DIAGNOSIS — I10 Essential (primary) hypertension: Secondary | ICD-10-CM | POA: Diagnosis not present

## 2017-06-21 DIAGNOSIS — K219 Gastro-esophageal reflux disease without esophagitis: Secondary | ICD-10-CM | POA: Diagnosis not present

## 2017-06-30 ENCOUNTER — Telehealth (INDEPENDENT_AMBULATORY_CARE_PROVIDER_SITE_OTHER): Payer: Self-pay | Admitting: Internal Medicine

## 2017-06-30 ENCOUNTER — Telehealth (INDEPENDENT_AMBULATORY_CARE_PROVIDER_SITE_OTHER): Payer: Self-pay | Admitting: *Deleted

## 2017-06-30 MED ORDER — OMEPRAZOLE 40 MG PO CPDR: 40.0000 mg | DELAYED_RELEASE_CAPSULE | Freq: Every day | ORAL | 3 refills | Status: DC

## 2017-06-30 NOTE — Telephone Encounter (Signed)
Will discontinue the Protonix.  Will try her on Protonix and see how she does.

## 2017-06-30 NOTE — Telephone Encounter (Signed)
The pharmacy called to verify that the omeprazole is correct, I made them aware of what [name] stated on the note.

## 2017-06-30 NOTE — Telephone Encounter (Signed)
Patient called wanting to speak with Terri about her acid reflux, patient requested for Terri to call her back. Please advise patient

## 2017-06-30 NOTE — Telephone Encounter (Signed)
Returned call. Stated Protonix is not working. Will try Omeprazole and see how she does

## 2017-07-01 DIAGNOSIS — Z8619 Personal history of other infectious and parasitic diseases: Secondary | ICD-10-CM | POA: Diagnosis not present

## 2017-07-01 DIAGNOSIS — R52 Pain, unspecified: Secondary | ICD-10-CM | POA: Diagnosis not present

## 2017-07-01 DIAGNOSIS — K219 Gastro-esophageal reflux disease without esophagitis: Secondary | ICD-10-CM | POA: Diagnosis not present

## 2017-07-01 DIAGNOSIS — F5101 Primary insomnia: Secondary | ICD-10-CM | POA: Diagnosis not present

## 2017-07-06 ENCOUNTER — Ambulatory Visit (INDEPENDENT_AMBULATORY_CARE_PROVIDER_SITE_OTHER): Payer: Medicare Other | Admitting: Internal Medicine

## 2017-07-06 ENCOUNTER — Encounter (INDEPENDENT_AMBULATORY_CARE_PROVIDER_SITE_OTHER): Payer: Self-pay | Admitting: Internal Medicine

## 2017-07-06 ENCOUNTER — Encounter (INDEPENDENT_AMBULATORY_CARE_PROVIDER_SITE_OTHER): Payer: Self-pay | Admitting: *Deleted

## 2017-07-06 VITALS — BP 154/76 | HR 60 | Temp 98.1°F | Ht 65.0 in | Wt 181.5 lb

## 2017-07-06 DIAGNOSIS — R1319 Other dysphagia: Secondary | ICD-10-CM

## 2017-07-06 DIAGNOSIS — R1310 Dysphagia, unspecified: Secondary | ICD-10-CM | POA: Diagnosis not present

## 2017-07-06 NOTE — Patient Instructions (Signed)
DG Esphagram.

## 2017-07-06 NOTE — Progress Notes (Signed)
Subjective:    Patient ID: [name], female    DOB: Jul 21, 1945, 72 y.o.   MRN: [id number] wt 182 04/08/2017 HPI Presents today with c/o burning sensation in her throat.  Sometimes when she tries to swallow certain foods, it feels like they will not go down. She says she has a hx of dysphagia and has been dilated many years ago. She has to be careful with hamburger and rice.  Acid reflux for the most part controlled with Omeprazole.   04/22/2016 Colonoscopy: Screening:   Impression:               - Two 4 to 6 mm polyps in the proximal transverse                            colon and in the ascending colon, removed with a                            cold snare. Resected and retrieved.                           - Diverticulosis in the sigmoid colon and in the                            descending colon.                           - External hemorrhoids.   Both polyps are tubular adenomas.    Review of Systems Past Medical History:  Diagnosis Date  . Anxiety   . Arthritis    "all over; mostly knees and shoulders"  (06/04/2015)   . Family history of adverse reaction to anesthesia    Patients mother had bad N/V after anesthesia  . GERD (gastroesophageal reflux disease)   . High cholesterol   . Hypertension   . Primary localized osteoarthritis of left knee     Past Surgical History:  Procedure Laterality Date  . APPENDECTOMY    . CHOLECYSTECTOMY OPEN     pain  . COLONOSCOPY N/A 04/22/2016   Procedure: COLONOSCOPY;  Surgeon: Rogene Houston, MD;  [facility];  Service: Endoscopy;  Laterality: N/A;  1200  . COLONOSCOPY W/ POLYPECTOMY    . JOINT REPLACEMENT    . KNEE ARTHROSCOPY W/ MENISCECTOMY Left   . KNEE SURGERY Left 1990s  . MASS EXCISION N/A 07/27/2016   Procedure: EXCISION OF SEBACEOUS CYST ON NECK;  Surgeon: Leta Baptist, MD;  Location: [place];  Service: ENT;  Laterality: N/A;  . OOPHORECTOMY     for a cyst ? side  . TOTAL KNEE ARTHROPLASTY  Left 06/03/2015  . TOTAL KNEE ARTHROPLASTY Left 06/03/2015   Procedure: TOTAL LEFT KNEE ARTHROPLASTY;  Surgeon: Elsie Saas, MD;  Location: [place];  Service: Orthopedics;  Laterality: Left;  . TUBAL LIGATION      No Known Allergies  Current Outpatient Medications on File Prior to Visit  Medication Sig Dispense Refill  . ALPRAZolam (XANAX) 0.5 MG tablet Take 0.5 mg by mouth at bedtime as needed for anxiety.    Marland Kitchen aspirin EC 81 MG tablet Take 1 tablet (81 mg total) by mouth daily.    Marland Kitchen atorvastatin (LIPITOR) 10 MG tablet Take 10 mg by mouth daily.    Marland Kitchen  Cholecalciferol (VITAMIN D-3) 5000 units TABS Take 1 tablet by mouth daily.    . diclofenac (VOLTAREN) 75 MG EC tablet Take 75 mg by mouth as needed.    Marland Kitchen losartan-hydrochlorothiazide (HYZAAR) 50-12.5 MG per tablet Take 1 tablet by mouth daily.    . montelukast (SINGULAIR) 10 MG tablet Take 10 mg by mouth at bedtime.    . NON FORMULARY [facility] mouth wash as needed    . omeprazole (PRILOSEC) 40 MG capsule Take 1 capsule (40 mg total) by mouth daily. 90 capsule 3  . omeprazole (PRILOSEC) 40 MG capsule Take 40 mg by mouth daily.    . sucralfate (CARAFATE) 1 GM/10ML suspension Take 1 g by mouth 4 (four) times daily -  with meals and at bedtime.    Marland Kitchen zolpidem (AMBIEN) 10 MG tablet Take 10 mg by mouth at bedtime as needed for sleep.    . pantoprazole (PROTONIX) 40 MG tablet Take 40 mg by mouth daily.    . Probiotic Product (ALIGN PO) Take 1 capsule by mouth daily.     . sucralfate (CARAFATE) 1 G tablet Take 1 g by mouth 4 (four) times daily as needed (for acid reflux).      No current facility-administered medications on file prior to visit.         Objective:   Physical Exam Blood pressure (!) 154/76, pulse 60, temperature 98.1 F (36.7 C), height 5\' 5"  (1.651 m), weight 181 lb 8 oz (82.3 kg). Alert and oriented. Skin warm and dry. Oral mucosa is moist.   . Sclera anicteric, conjunctivae is pink. Thyroid not enlarged. No cervical lymphadenopathy.  Lungs clear. Heart regular rate and rhythm.  Abdomen is soft. Bowel sounds are positive. No hepatomegaly. No abdominal masses felt. No tenderness.  No edema to lower extremities.           Assessment & Plan:  Dysphagia. DG esophagram. Further recommendation to follow.

## 2017-07-09 ENCOUNTER — Ambulatory Visit (HOSPITAL_COMMUNITY)
Admission: RE | Admit: 2017-07-09 | Discharge: 2017-07-09 | Disposition: A | Discharge status: Home / Self Care | Payer: Medicare Other | Source: Ambulatory Visit | Attending: Internal Medicine | Admitting: Internal Medicine

## 2017-07-09 DIAGNOSIS — R1310 Dysphagia, unspecified: Secondary | ICD-10-CM | POA: Insufficient documentation

## 2017-07-09 DIAGNOSIS — T17300A Unspecified foreign body in larynx causing asphyxiation, initial encounter: Secondary | ICD-10-CM | POA: Diagnosis not present

## 2017-07-09 DIAGNOSIS — R1319 Other dysphagia: Secondary | ICD-10-CM

## 2017-07-12 NOTE — Progress Notes (Signed)
Patient is on recall list.

## 2017-07-27 ENCOUNTER — Other Ambulatory Visit: Payer: Self-pay

## 2017-07-27 ENCOUNTER — Emergency Department (HOSPITAL_COMMUNITY): Payer: Medicare Other

## 2017-07-27 ENCOUNTER — Encounter (HOSPITAL_COMMUNITY): Payer: Self-pay | Admitting: Emergency Medicine

## 2017-07-27 DIAGNOSIS — Z79899 Other long term (current) drug therapy: Secondary | ICD-10-CM | POA: Insufficient documentation

## 2017-07-27 DIAGNOSIS — Z7982 Long term (current) use of aspirin: Secondary | ICD-10-CM | POA: Insufficient documentation

## 2017-07-27 DIAGNOSIS — R079 Chest pain, unspecified: Secondary | ICD-10-CM | POA: Diagnosis not present

## 2017-07-27 DIAGNOSIS — I10 Essential (primary) hypertension: Secondary | ICD-10-CM | POA: Diagnosis not present

## 2017-07-27 DIAGNOSIS — Z96652 Presence of left artificial knee joint: Secondary | ICD-10-CM | POA: Diagnosis not present

## 2017-07-27 DIAGNOSIS — R0789 Other chest pain: Secondary | ICD-10-CM | POA: Diagnosis not present

## 2017-07-27 NOTE — ED Triage Notes (Signed)
Pt c/o chest pain for weeks but got worse tonight after dinner.

## 2017-07-28 ENCOUNTER — Other Ambulatory Visit: Payer: Self-pay

## 2017-07-28 ENCOUNTER — Other Ambulatory Visit (INDEPENDENT_AMBULATORY_CARE_PROVIDER_SITE_OTHER): Payer: Self-pay | Admitting: Internal Medicine

## 2017-07-28 ENCOUNTER — Emergency Department (HOSPITAL_COMMUNITY)
Admission: EM | Admit: 2017-07-28 | Discharge: 2017-07-28 | Disposition: A | Discharge status: Home / Self Care | Payer: Medicare Other | Attending: Emergency Medicine | Admitting: Emergency Medicine

## 2017-07-28 ENCOUNTER — Telehealth (INDEPENDENT_AMBULATORY_CARE_PROVIDER_SITE_OTHER): Payer: Self-pay | Admitting: *Deleted

## 2017-07-28 DIAGNOSIS — R079 Chest pain, unspecified: Secondary | ICD-10-CM | POA: Diagnosis not present

## 2017-07-28 DIAGNOSIS — R0789 Other chest pain: Secondary | ICD-10-CM

## 2017-07-28 LAB — BASIC METABOLIC PANEL
Anion gap: 10 (ref 5–15)
BUN: 18 mg/dL (ref 6–20)
CHLORIDE: 101 mmol/L (ref 101–111)
CO2: 27 mmol/L (ref 22–32)
CREATININE: 0.82 mg/dL (ref 0.44–1.00)
Calcium: 9.5 mg/dL (ref 8.9–10.3)
GFR calc Af Amer: >60 mL/min (ref >60)
Glucose, Bld: 101 mg/dL — ABNORMAL HIGH (ref 65–99)
POTASSIUM: 3.4 mmol/L — AB (ref 3.5–5.1)
SODIUM: 138 mmol/L (ref 135–145)

## 2017-07-28 LAB — CBC
HEMATOCRIT: 41.2 % (ref 36.0–46.0)
Hemoglobin: 13.7 g/dL (ref 12.0–15.0)
MCH: 29.7 pg (ref 26.0–34.0)
MCHC: 33.3 g/dL (ref 30.0–36.0)
MCV: 89.2 fL (ref 78.0–100.0)
PLATELETS: 226 10*3/uL (ref 150–400)
RBC: 4.62 MIL/uL (ref 3.87–5.11)
RDW: 12.7 % (ref 11.5–15.5)
WBC: 8.8 10*3/uL (ref 4.0–10.5)

## 2017-07-28 LAB — I-STAT TROPONIN, ED: TROPONIN I, POC: 0 ng/mL (ref 0.00–0.08)

## 2017-07-28 LAB — TROPONIN I: Troponin I: <0.03 ng/mL (ref <0.03)

## 2017-07-28 NOTE — Progress Notes (Signed)
EGD ordered. Patient request. Episode ofches tpain.

## 2017-07-28 NOTE — Telephone Encounter (Signed)
EGD ordered

## 2017-07-28 NOTE — ED Provider Notes (Signed)
[facility] EMERGENCY DEPARTMENT Provider Note   CSN: [id number] Arrival date & time: 07/27/17  2341     History   Chief Complaint Chief Complaint  Patient presents with  . Chest Pain    HPI [name] is a 72 y.o. female.  Patient is a 73 year old female with past medical history of hypertension, high cholesterol, but no prior cardiac history.  She presents today for evaluation of chest discomfort.  She states that this is been ongoing intermittently for the past 5 weeks, however became worse this evening.  She describes her symptoms as a tightness to the center of her chest with no associated shortness of breath, nausea, diaphoresis, or radiation to the arm or jaw.  She denies any recent exertional symptoms.   The history is provided by the patient.  Chest Pain   This is a new problem. Episode onset: 5 weeks ago. The problem has been gradually worsening. The pain is present in the substernal region. The pain is mild. The quality of the pain is described as pressure-like. The pain does not radiate. Pertinent negatives include no cough, no diaphoresis, no fever and no shortness of breath. She has tried nothing for the symptoms. The treatment provided no relief.    Past Medical History:  Diagnosis Date  . Anxiety   . Arthritis    "all over; mostly knees and shoulders"  (06/04/2015)   . Family history of adverse reaction to anesthesia    Patients mother had bad N/V after anesthesia  . GERD (gastroesophageal reflux disease)   . High cholesterol   . Hypertension   . Primary localized osteoarthritis of left knee     Patient Active Problem List   Diagnosis Date Noted  . Special screening for malignant neoplasms, colon 01/24/2016  . DJD (degenerative joint disease) of knee 06/03/2015  . Primary localized osteoarthritis of left knee   . Hypertension   . GERD (gastroesophageal reflux disease) 09/11/2014  . High cholesterol 09/11/2014    Past Surgical History:  Procedure  Laterality Date  . APPENDECTOMY    . CHOLECYSTECTOMY OPEN     pain  . COLONOSCOPY N/A 04/22/2016   Procedure: COLONOSCOPY;  Surgeon: Rogene Houston, MD;  [facility];  Service: Endoscopy;  Laterality: N/A;  1200  . COLONOSCOPY W/ POLYPECTOMY    . JOINT REPLACEMENT    . KNEE ARTHROSCOPY W/ MENISCECTOMY Left   . KNEE SURGERY Left 1990s  . MASS EXCISION N/A 07/27/2016   Procedure: EXCISION OF SEBACEOUS CYST ON NECK;  Surgeon: Leta Baptist, MD;  Location: [place];  Service: ENT;  Laterality: N/A;  . OOPHORECTOMY     for a cyst ? side  . TOTAL KNEE ARTHROPLASTY Left 06/03/2015  . TOTAL KNEE ARTHROPLASTY Left 06/03/2015   Procedure: TOTAL LEFT KNEE ARTHROPLASTY;  Surgeon: Elsie Saas, MD;  Location: [place];  Service: Orthopedics;  Laterality: Left;  . TUBAL LIGATION      OB History    No data available       Home Medications    Prior to Admission medications   Medication Sig Start Date End Date Taking? Authorizing Provider  aspirin EC 81 MG tablet Take 1 tablet (81 mg total) by mouth daily. 04/23/16  Yes Rehman, Mechele Dawley, MD  atorvastatin (LIPITOR) 10 MG tablet Take 10 mg by mouth daily.   Yes [provider]  Cholecalciferol (VITAMIN D-3) 5000 units TABS Take 1 tablet by mouth daily.   Yes [provider]  diclofenac (VOLTAREN) 75 MG EC tablet Take 75 mg by mouth as needed.   Yes [provider]  losartan-hydrochlorothiazide (HYZAAR) 50-12.5 MG per tablet Take 1 tablet by mouth daily.   Yes [provider]  montelukast (SINGULAIR) 10 MG tablet Take 10 mg by mouth at bedtime.   Yes [provider]  NON FORMULARY [facility] mouth wash as needed   Yes [provider]  omeprazole (PRILOSEC) 40 MG capsule Take 1 capsule (40 mg total) by mouth daily. 06/30/17  Yes Setzer, Rona Ravens, NP  omeprazole (PRILOSEC) 40 MG capsule Take 40 mg by mouth daily.   Yes [provider]  Probiotic Product (ALIGN PO) Take 1 capsule  by mouth daily.    Yes [provider]  sucralfate (CARAFATE) 1 G tablet Take 1 g by mouth 4 (four) times daily as needed (for acid reflux).    Yes [provider]  sucralfate (CARAFATE) 1 GM/10ML suspension Take 1 g by mouth 4 (four) times daily -  with meals and at bedtime.   Yes [provider]  zolpidem (AMBIEN) 10 MG tablet Take 10 mg by mouth at bedtime as needed for sleep.   Yes [provider]  ALPRAZolam Duanne Moron) 0.5 MG tablet Take 0.5 mg by mouth at bedtime as needed for anxiety.    [provider]  pantoprazole (PROTONIX) 40 MG tablet Take 40 mg by mouth daily.    [provider]    Family History Family History  Problem Relation Age of Onset  . Heart disease Mother   . Diabetes Mellitus II Mother   . Kidney disease Mother   . Cancer Mother   . Hypertension Father   . Cancer Father   . Hypertension Brother   . Hypertension Sister   . Colon cancer Neg Hx     Social History Social History   Tobacco Use  . Smoking status: Never Smoker  . Smokeless tobacco: Never Used  Substance Use Topics  . Alcohol use: No    Alcohol/week: 0.0 oz  . Drug use: No     Allergies   Patient has no known allergies.   Review of Systems Review of Systems  Constitutional: Negative for diaphoresis and fever.  Respiratory: Negative for cough and shortness of breath.   Cardiovascular: Positive for chest pain.  All other systems reviewed and are negative.    Physical Exam Updated Vital Signs BP (!) 144/71   Pulse 81   Temp 98 F (36.7 C) (Oral)   Resp 18   Ht 5\' 5"  (1.651 m)   Wt 81.6 kg (180 lb)   SpO2 96%   BMI 29.95 kg/m   Physical Exam  Constitutional: She is oriented to person, place, and time. She appears well-developed and well-nourished. No distress.  HENT:  Head: Normocephalic and atraumatic.  Neck: Normal range of motion. Neck supple.  Cardiovascular: Normal rate and regular rhythm. Exam reveals no gallop and  no friction rub.  No murmur heard. Pulmonary/Chest: Effort normal and breath sounds normal. No respiratory distress. She has no wheezes.  Abdominal: Soft. Bowel sounds are normal. She exhibits no distension. There is no tenderness.  Musculoskeletal: Normal range of motion.  Neurological: She is alert and oriented to person, place, and time.  Skin: Skin is warm and dry. She is not diaphoretic.  Nursing note and vitals reviewed.    ED Treatments / Results  Labs (all labs ordered are listed, but only abnormal results are displayed) Labs  Reviewed  BASIC METABOLIC PANEL - Abnormal; Notable for the following components:      Result Value   Potassium 3.4 (*)    Glucose, Bld 101 (*)    All other components within normal limits  CBC  TROPONIN I  I-STAT TROPONIN, ED    EKG  EKG Interpretation  Date/Time:  Tuesday July 27 2017 23:50:03 EST Ventricular Rate:  73 PR Interval:  186 QRS Duration: 80 QT Interval:  396 QTC Calculation: 436 R Axis:   20 Text Interpretation:  Normal sinus rhythm Anterior infarct , age undetermined Abnormal ECG Confirmed by Veryl Speak [phone]) on 07/27/2017 11:55:50 PM       Radiology Dg Chest 2 View  Result Date: 07/28/2017 CLINICAL DATA:  Chest pain EXAM: CHEST  2 VIEW COMPARISON:  01/12/2015 FINDINGS: Heart and mediastinal contours are within normal limits. No focal opacities or effusions. No acute bony abnormality. IMPRESSION: No active cardiopulmonary disease. Electronically Signed   By: Rolm Baptise M.D.   On: 07/28/2017 00:30    Procedures Procedures (including critical care time)  Medications Ordered in ED Medications - No data to display   Initial Impression / Assessment and Plan / ED Course  I have reviewed the triage vital signs and the nursing notes.  Pertinent labs & imaging results that were available during my care of the patient were reviewed by me and considered in my medical decision making (see chart for  details).  Patient's symptoms are atypical for cardiac pain and workup is unremarkable.  She is experienced 5 weeks worth of symptoms, however her EKG and troponin x2 are negative.  She is now pain-free.  At this point I feel as though she is appropriate for discharge.  She does have an upcoming appointment with cardiology in approximately 2 weeks to have these symptoms investigated.  I have advised her to call to see if she can move this appointment up.  Nothing this evening appears emergent.  There is no hypoxia, tachycardia, or tachypnea, I highly doubt PE.  Final Clinical Impressions(s) / ED Diagnoses   Final diagnoses:  None    ED Discharge Orders    None       Veryl Speak, MD 07/28/17 [phone]

## 2017-07-28 NOTE — Telephone Encounter (Signed)
Patient called left message stating she is having complications of her esophagus. Please advise [phone]

## 2017-07-28 NOTE — Discharge Instructions (Signed)
Call the cardiology clinic in the morning to see if it would be possible for your appointment to be moved up.  Return to the emergency department if you develop worsening symptoms, or other new or concerning symptoms.

## 2017-07-29 ENCOUNTER — Encounter (INDEPENDENT_AMBULATORY_CARE_PROVIDER_SITE_OTHER): Payer: Self-pay | Admitting: *Deleted

## 2017-07-29 DIAGNOSIS — R0789 Other chest pain: Secondary | ICD-10-CM | POA: Insufficient documentation

## 2017-07-29 NOTE — Telephone Encounter (Signed)
EGD sch'd 09/08/17, patient aware, instructions mailed

## 2017-08-10 DIAGNOSIS — H40053 Ocular hypertension, bilateral: Secondary | ICD-10-CM | POA: Diagnosis not present

## 2017-08-12 ENCOUNTER — Encounter: Payer: Self-pay | Admitting: Cardiology

## 2017-08-12 ENCOUNTER — Ambulatory Visit: Payer: Medicare Other | Admitting: Cardiology

## 2017-08-12 VITALS — BP 162/84 | HR 85 | Ht 65.0 in | Wt 183.8 lb

## 2017-08-12 DIAGNOSIS — E782 Mixed hyperlipidemia: Secondary | ICD-10-CM

## 2017-08-12 DIAGNOSIS — I10 Essential (primary) hypertension: Secondary | ICD-10-CM | POA: Diagnosis not present

## 2017-08-12 DIAGNOSIS — R0789 Other chest pain: Secondary | ICD-10-CM | POA: Diagnosis not present

## 2017-08-12 DIAGNOSIS — R011 Cardiac murmur, unspecified: Secondary | ICD-10-CM | POA: Diagnosis not present

## 2017-08-12 NOTE — Progress Notes (Addendum)
Clinical Summary [name] is a 72 y.o.female seen as new consult, referred by Dr Nevada Crane for chest pain.   1. Chest pain - ER visit 07/2017 with chest pain. Trop neg x 2, EKG with  . CXR showed no acute process -midchest and epigastric. Tightness/soreness, tender to palpation. Mainly after meals, based on meals. No other assocaited symptoms. Pain lasts about 10 minutes, sporadic - ride stationary bike x 67miles daily, no symptoms. No significant SOB/DOE - she reports remote cath she late 1990s - she reports history of GERD. Feeling of dysphagia. - antacid recently changed with some improvement  CAD risk factors: HTN, HL  2. HTN - compliant with meds   3. HL - Jan 2019 TC 151 TG 120 HDL 61 LD 66 - compliant with statin    Past Medical History:  Diagnosis Date  . Anxiety   . Arthritis    "all over; mostly knees and shoulders"  (06/04/2015)   . Family history of adverse reaction to anesthesia    Patients mother had bad N/V after anesthesia  . GERD (gastroesophageal reflux disease)   . High cholesterol   . Hypertension   . Primary localized osteoarthritis of left knee      No Known Allergies   Current Outpatient Medications  Medication Sig Dispense Refill  . ALPRAZolam (XANAX) 0.5 MG tablet Take 0.5 mg by mouth at bedtime as needed for anxiety.    Marland Kitchen aspirin EC 81 MG tablet Take 1 tablet (81 mg total) by mouth daily.    Marland Kitchen atorvastatin (LIPITOR) 10 MG tablet Take 10 mg by mouth daily.    . Cholecalciferol (VITAMIN D-3) 5000 units TABS Take 1 tablet by mouth daily.    . diclofenac (VOLTAREN) 75 MG EC tablet Take 75 mg by mouth as needed.    Marland Kitchen losartan-hydrochlorothiazide (HYZAAR) 50-12.5 MG per tablet Take 1 tablet by mouth daily.    . montelukast (SINGULAIR) 10 MG tablet Take 10 mg by mouth at bedtime.    . NON FORMULARY [facility] mouth wash as needed    . omeprazole (PRILOSEC) 40 MG capsule Take 1 capsule (40 mg total) by mouth daily. 90 capsule 3  . omeprazole (PRILOSEC) 40  MG capsule Take 40 mg by mouth daily.    . pantoprazole (PROTONIX) 40 MG tablet Take 40 mg by mouth daily.    . Probiotic Product (ALIGN PO) Take 1 capsule by mouth daily.     . sucralfate (CARAFATE) 1 G tablet Take 1 g by mouth 4 (four) times daily as needed (for acid reflux).     . sucralfate (CARAFATE) 1 GM/10ML suspension Take 1 g by mouth 4 (four) times daily -  with meals and at bedtime.    Marland Kitchen zolpidem (AMBIEN) 10 MG tablet Take 10 mg by mouth at bedtime as needed for sleep.     No current facility-administered medications for this visit.      Past Surgical History:  Procedure Laterality Date  . APPENDECTOMY    . CHOLECYSTECTOMY OPEN     pain  . COLONOSCOPY N/A 04/22/2016   Procedure: COLONOSCOPY;  Surgeon: Rogene Houston, MD;  [facility];  Service: Endoscopy;  Laterality: N/A;  1200  . COLONOSCOPY W/ POLYPECTOMY    . JOINT REPLACEMENT    . KNEE ARTHROSCOPY W/ MENISCECTOMY Left   . KNEE SURGERY Left 1990s  . MASS EXCISION N/A 07/27/2016   Procedure: EXCISION OF SEBACEOUS CYST ON NECK;  Surgeon: Leta Baptist, MD;  Location: [place];  Service: ENT;  Laterality: N/A;  . OOPHORECTOMY     for a cyst ? side  . TOTAL KNEE ARTHROPLASTY Left 06/03/2015  . TOTAL KNEE ARTHROPLASTY Left 06/03/2015   Procedure: TOTAL LEFT KNEE ARTHROPLASTY;  Surgeon: Elsie Saas, MD;  Location: [place];  Service: Orthopedics;  Laterality: Left;  . TUBAL LIGATION       No Known Allergies    Family History  Problem Relation Age of Onset  . Heart disease Mother   . Diabetes Mellitus II Mother   . Kidney disease Mother   . Cancer Mother   . Hypertension Father   . Cancer Father   . Hypertension Brother   . Hypertension Sister   . Colon cancer Neg Hx      Social History [name] reports that  has never smoked. she has never used smokeless tobacco. [name] reports that she does not drink alcohol.   Review of Systems CONSTITUTIONAL: No weight loss, fever, chills,  weakness or fatigue.  HEENT: Eyes: No visual loss, blurred vision, double vision or yellow sclerae.No hearing loss, sneezing, congestion, runny nose or sore throat.  SKIN: No rash or itching.  CARDIOVASCULAR: per hpi RESPIRATORY: per hpi GASTROINTESTINAL: No anorexia, nausea, vomiting or diarrhea. No abdominal pain or blood.  GENITOURINARY: No burning on urination, no polyuria NEUROLOGICAL: No headache, dizziness, syncope, paralysis, ataxia, numbness or tingling in the extremities. No change in bowel or bladder control.  MUSCULOSKELETAL: No muscle, back pain, joint pain or stiffness.  LYMPHATICS: No enlarged nodes. No history of splenectomy.  PSYCHIATRIC: No history of depression or anxiety.  ENDOCRINOLOGIC: No reports of sweating, cold or heat intolerance. No polyuria or polydipsia.  Marland Kitchen   Physical Examination Vitals:   08/12/17 0916 08/12/17 0923  BP: (!) 152/86 (!) 162/84  Pulse: 85   SpO2: 96%    Vitals:   08/12/17 0916  Weight: 183 lb 12.8 oz (83.4 kg)  Height: 5\' 5"  (1.651 m)    Gen: resting comfortably, no acute distress HEENT: no scleral icterus, pupils equal round and reactive, no palptable cervical adenopathy,  CV: RRR, 2/6 systolic murmur rusb no jvd Resp: Clear to auscultation bilaterally GI: abdomen is soft, non-tender, non-distended, normal bowel sounds, no hepatosplenomegaly MSK: extremities are warm, no edema.  Skin: warm, no rash Neuro:  no focal deficits Psych: appropriate affect   Diagnostic Studies  2002 cath  PROCEDURES PERFORMED:  Outpatient cardiac catheterization consisting of: 1. Selective coronary angiography. 2. Retrograde left heart catheterization. 3. Left ventricular angiography. 4. Abdominal aortography.  CARDIOLOGIST:  Bryson Dames, M.D.  COMPLICATIONS:  None.  ENTRY SITE:  Right femoral.  DYE USED:  Omnipaque.  PATIENT PROFILE:  The patient is a 72 year old woman who had a single episode of chest pain and subsequently  had a stress test showing anterior and possible inferior ischemia.  She also had a baseline abnormal EKG and a high LDL cholesterol.  The patient entered the catheterization lab today as an outpatient electively for diagnostic catheterization.  RESULTS:  PRESSURES:  The left ventricular pressure was 165/20, central aortic pressure was 165/95.  No aortic valve gradient noted by pullback technique.  ANGIOGRAPHIC RESULTS:  The left main coronary artery, left anterior descending coronary artery, and circumflex coronary artery were all angiographically patent.  The circumflex consisted of basically a single vessel that trifurcated distally.  The LAD gave rise to one major diagonal [employer] Kreiser which was patent.  The right coronary artery was dominant.  No lesions were seen.  The abdominal aorta showed no evidence of aneurysm formation.  The renal arteries were normal.  The common iliacs were patent.  FINAL DIAGNOSES: 1. Angiographically patent coronary arteries. 2. Normal left ventricular systolic function. 3. Angiographically patent renal arteries.   Assessment and Plan  1. Chest pain - symptoms not typical of cardiac etiology, most consistent with GI cause.  - no further cardiac testing at this time, continue to monitor  2. HTN - elevated in clinic, she will submit bp log in 2 weeks  3. Hyperlipidemia - LDL at goal, continue current meds  4. Heart murmur - very faint systolic murmur over RUSB, continue to monitor at this time. Mild murmur, no cardipulmonary symptoms.    F/u 1 year  10/31/17 Addendum: Bp log submitted, home bp's are at goal   Arnoldo Lenis, M.D.

## 2017-08-12 NOTE — Patient Instructions (Signed)
Medication Instructions:  Your physician recommends that you continue on your current medications as directed. Please refer to the Current Medication list given to you today.   Labwork: NONE  Testing/Procedures: NONE  Follow-Up: Your physician wants you to follow-up in: 1 YEAR.  You will receive a reminder letter in the mail two months in advance. If you don't receive a letter, please call our office to schedule the follow-up appointment.   Any Other Special Instructions Will Be Listed Below (If Applicable).  PLEASE KEEP A BLOOD PRESSURE LOG FOR 1 WEEK. THEN CALL OR DROP OFF YOUR READINGS FOR DR. BRANCH TO REVIEW.    If you need a refill on your cardiac medications before your next appointment, please call your pharmacy.

## 2017-08-16 ENCOUNTER — Encounter: Payer: Self-pay | Admitting: Cardiology

## 2017-09-08 ENCOUNTER — Other Ambulatory Visit: Payer: Self-pay

## 2017-09-08 ENCOUNTER — Encounter (HOSPITAL_COMMUNITY): Payer: Self-pay | Admitting: *Deleted

## 2017-09-08 ENCOUNTER — Encounter (HOSPITAL_COMMUNITY)
Admission: RE | Disposition: A | Discharge status: Home / Self Care | Payer: Self-pay | Source: Ambulatory Visit | Attending: Internal Medicine

## 2017-09-08 ENCOUNTER — Ambulatory Visit (HOSPITAL_COMMUNITY)
Admission: RE | Admit: 2017-09-08 | Discharge: 2017-09-08 | Disposition: A | Discharge status: Home / Self Care | Payer: Medicare Other | Source: Ambulatory Visit | Attending: Internal Medicine | Admitting: Internal Medicine

## 2017-09-08 DIAGNOSIS — E7800 Pure hypercholesterolemia, unspecified: Secondary | ICD-10-CM | POA: Diagnosis not present

## 2017-09-08 DIAGNOSIS — K228 Other specified diseases of esophagus: Secondary | ICD-10-CM | POA: Diagnosis not present

## 2017-09-08 DIAGNOSIS — K219 Gastro-esophageal reflux disease without esophagitis: Secondary | ICD-10-CM | POA: Diagnosis not present

## 2017-09-08 DIAGNOSIS — Z9049 Acquired absence of other specified parts of digestive tract: Secondary | ICD-10-CM | POA: Diagnosis not present

## 2017-09-08 DIAGNOSIS — Z96652 Presence of left artificial knee joint: Secondary | ICD-10-CM | POA: Diagnosis not present

## 2017-09-08 DIAGNOSIS — I10 Essential (primary) hypertension: Secondary | ICD-10-CM | POA: Insufficient documentation

## 2017-09-08 DIAGNOSIS — Z79899 Other long term (current) drug therapy: Secondary | ICD-10-CM | POA: Insufficient documentation

## 2017-09-08 DIAGNOSIS — R1314 Dysphagia, pharyngoesophageal phase: Secondary | ICD-10-CM | POA: Insufficient documentation

## 2017-09-08 DIAGNOSIS — K449 Diaphragmatic hernia without obstruction or gangrene: Secondary | ICD-10-CM | POA: Insufficient documentation

## 2017-09-08 DIAGNOSIS — R0789 Other chest pain: Secondary | ICD-10-CM | POA: Diagnosis not present

## 2017-09-08 DIAGNOSIS — Z7982 Long term (current) use of aspirin: Secondary | ICD-10-CM | POA: Insufficient documentation

## 2017-09-08 HISTORY — PX: ESOPHAGOGASTRODUODENOSCOPY: SHX5428

## 2017-09-08 SURGERY — EGD (ESOPHAGOGASTRODUODENOSCOPY)
Anesthesia: Moderate Sedation

## 2017-09-08 MED ORDER — LIDOCAINE VISCOUS 2 % MT SOLN
OROMUCOSAL | Status: DC | PRN
  Administered 2017-09-08: 1 via OROMUCOSAL

## 2017-09-08 MED ORDER — MEPERIDINE HCL 50 MG/ML IJ SOLN
INTRAMUSCULAR | Status: DC | PRN
  Administered 2017-09-08 (×2): 25 mg via INTRAVENOUS

## 2017-09-08 MED ORDER — SODIUM CHLORIDE 0.9 % IV SOLN
INTRAVENOUS | Status: DC
  Administered 2017-09-08: 1000 mL via INTRAVENOUS

## 2017-09-08 MED ORDER — MIDAZOLAM HCL 5 MG/5ML IJ SOLN
INTRAMUSCULAR | Status: AC
  Filled 2017-09-08: qty 10

## 2017-09-08 MED ORDER — STERILE WATER FOR IRRIGATION IR SOLN
Status: DC | PRN
  Administered 2017-09-08: 15 mL

## 2017-09-08 MED ORDER — FAMOTIDINE 20 MG PO TABS: 20.0000 mg | ORAL_TABLET | Freq: Every evening | ORAL | Status: DC | PRN

## 2017-09-08 MED ORDER — MEPERIDINE HCL 50 MG/ML IJ SOLN
INTRAMUSCULAR | Status: AC
  Filled 2017-09-08: qty 1

## 2017-09-08 MED ORDER — LIDOCAINE VISCOUS 2 % MT SOLN
OROMUCOSAL | Status: AC
  Filled 2017-09-08: qty 15

## 2017-09-08 MED ORDER — MIDAZOLAM HCL 5 MG/5ML IJ SOLN
INTRAMUSCULAR | Status: DC | PRN
  Administered 2017-09-08: 2 mg via INTRAVENOUS
  Administered 2017-09-08: 1 mg via INTRAVENOUS
  Administered 2017-09-08: 2 mg via INTRAVENOUS
  Administered 2017-09-08: 1 mg via INTRAVENOUS
  Administered 2017-09-08: 2 mg via INTRAVENOUS
  Administered 2017-09-08: 1 mg via INTRAVENOUS

## 2017-09-08 NOTE — Discharge Instructions (Signed)
Resume usual medications and diet as before. Pepcid OTC or famotidine OTC 20 mg every evening or at bedtime on as-needed basis. No driving for 24 hours. Please call office with progress report in 1 week. Swallowing difficulty persists with proceed with further evaluation.      Esophagogastroduodenoscopy, Care After Refer to this sheet in the next few weeks. These instructions provide you with information about caring for yourself after your procedure. Your health care provider may also give you more specific instructions. Your treatment has been planned according to current medical practices, but problems sometimes occur. Call your health care provider if you have any problems or questions after your procedure. What can I expect after the procedure? After the procedure, it is common to have:  A sore throat.  Nausea.  Bloating.  Dizziness.  Fatigue.  Follow these instructions at home:  Do not eat or drink anything until the numbing medicine (local anesthetic) has worn off and your gag reflex has returned. You will know that the local anesthetic has worn off when you can swallow comfortably.  Do not drive for 24 hours if you received a medicine to help you relax (sedative).  If your health care provider took a tissue sample for testing during the procedure, make sure to get your test results. This is your responsibility. Ask your health care provider or the department performing the test when your results will be ready.  Keep all follow-up visits as told by your health care provider. This is important. Contact a health care provider if:  You cannot stop coughing.  You are not urinating.  You are urinating less than usual. Get help right away if:  You have trouble swallowing.  You cannot eat or drink.  You have throat or chest pain that gets worse.  You are dizzy or light-headed.  You faint.  You have nausea or vomiting.  You have chills.  You have a  fever.  You have severe abdominal pain.  You have black, tarry, or bloody stools. This information is not intended to replace advice given to you by your health care provider. Make sure you discuss any questions you have with your health care provider. Document Released: 05/04/2012 Document Revised: 10/24/2015 Document Reviewed: 04/11/2015 Elsevier Interactive Patient Education  2018 [place].     Hiatal Hernia A hiatal hernia occurs when part of the stomach slides above the muscle that separates the abdomen from the chest (diaphragm). A person can be born with a hiatal hernia (congenital), or it may develop over time. In almost all cases of hiatal hernia, only the top part of the stomach pushes through the diaphragm. Many people have a hiatal hernia with no symptoms. The larger the hernia, the more likely it is that you will have symptoms. In some cases, a hiatal hernia allows stomach acid to flow back into the tube that carries food from your mouth to your stomach (esophagus). This may cause heartburn symptoms. Severe heartburn symptoms may mean that you have developed a condition called gastroesophageal reflux disease (GERD). What are the causes? This condition is caused by a weakness in the opening (hiatus) where the esophagus passes through the diaphragm to attach to the upper part of the stomach. A person may be born with a weakness in the hiatus, or a weakness can develop over time. What increases the risk? This condition is more likely to develop in:  Older people. Age is a major risk factor for a hiatal hernia, especially if you  are over the age of 21.  Pregnant women.  People who are overweight.  People who have frequent constipation.  What are the signs or symptoms? Symptoms of this condition usually develop in the form of GERD symptoms. Symptoms include:  Heartburn.  Belching.  Indigestion.  Trouble swallowing.  Coughing or wheezing.  Sore  throat.  Hoarseness.  Chest pain.  Nausea and vomiting.  How is this diagnosed? This condition may be diagnosed during testing for GERD. Tests that may be done include:  X-rays of your stomach or chest.  An upper gastrointestinal (GI) series. This is an X-ray exam of your GI tract that is taken after you swallow a chalky liquid that shows up clearly on the X-ray.  Endoscopy. This is a procedure to look into your stomach using a thin, flexible tube that has a tiny camera and light on the end of it.  How is this treated? This condition may be treated by:  Dietary and lifestyle changes to help reduce GERD symptoms.  Medicines. These may include: ? Over-the-counter antacids. ? Medicines that make your stomach empty more quickly. ? Medicines that block the production of stomach acid (H2 blockers). ? Stronger medicines to reduce stomach acid (proton pump inhibitors).  Surgery to repair the hernia, if other treatments are not helping.  If you have no symptoms, you may not need treatment. Follow these instructions at home: Lifestyle and activity  Do not use any products that contain nicotine or tobacco, such as cigarettes and e-cigarettes. If you need help quitting, ask your health care provider.  Try to achieve and maintain a healthy body weight.  Avoid putting pressure on your abdomen. Anything that puts pressure on your abdomen increases the amount of acid that may be pushed up into your esophagus. ? Avoid bending over, especially after eating. ? Raise the head of your bed by putting blocks under the legs. This keeps your head and esophagus higher than your stomach. ? Do not wear tight clothing around your chest or stomach. ? Try not to strain when having a bowel movement, when urinating, or when lifting heavy objects. Eating and drinking  Avoid foods that can worsen GERD symptoms. These may include: ? Fatty foods, like fried foods. ? Citrus fruits, like oranges or  lemon. ? Other foods and drinks that contain acid, like orange juice or tomatoes. ? Spicy food. ? Chocolate.  Eat frequent small meals instead of three large meals a day. This helps prevent your stomach from getting too full. ? Eat slowly. ? Do not lie down right after eating. ? Do not eat 1-2 hours before bed.  Do not drink beverages with caffeine. These include cola, coffee, cocoa, and tea.  Do not drink alcohol. General instructions  Take over-the-counter and prescription medicines only as told by your health care provider.  Keep all follow-up visits as told by your health care provider. This is important. Contact a health care provider if:  Your symptoms are not controlled with medicines or lifestyle changes.  You are having trouble swallowing.  You have coughing or wheezing that will not go away. Get help right away if:  Your pain is getting worse.  Your pain spreads to your arms, neck, jaw, teeth, or back.  You have shortness of breath.  You sweat for no reason.  You feel sick to your stomach (nauseous) or you vomit.  You vomit blood.  You have bright red blood in your stools.  You have black, tarry stools. This  information is not intended to replace advice given to you by your health care provider. Make sure you discuss any questions you have with your health care provider. Document Released: 08/08/2003 Document Revised: 05/11/2016 Document Reviewed: 05/11/2016 Elsevier Interactive Patient Education  [employer].

## 2017-09-08 NOTE — Op Note (Signed)
[facility] Patient Name: [name] Procedure Date: 09/08/2017 3:32 PM MRN: [id number] Date of Birth: Jul 29, 1945 Attending MD: Hildred Laser , MD CSN: [id number] Age: 72 Admit Type: Outpatient Procedure:                Upper GI endoscopy Indications:              Esophageal dysphagia, Gastro-esophageal reflux                            disease Providers:                Hildred Laser, MD, Charlsie Quest. Theda Sers RN, RN, Nelma Rothman, Technician Referring MD:             Delphina Cahill, MD Medicines:                Lidocaine spray, Meperidine 50 mg IV, Midazolam 9                            mg IV Complications:            No immediate complications. Estimated Blood Loss:     Estimated blood loss: none. Procedure:                Pre-Anesthesia Assessment:                           - Prior to the procedure, a History and Physical                            was performed, and patient medications and                            allergies were reviewed. The patient's tolerance of                            previous anesthesia was also reviewed. The risks                            and benefits of the procedure and the sedation                            options and risks were discussed with the patient.                            All questions were answered, and informed consent                            was obtained. Prior Anticoagulants: The patient                            last took aspirin 3 days prior to the procedure.  ASA Grade Assessment: II - A patient with mild                            systemic disease. After reviewing the risks and                            benefits, the patient was deemed in satisfactory                            condition to undergo the procedure.                           After obtaining informed consent, the endoscope was                            passed under direct vision. Throughout the                             procedure, the patient's blood pressure, pulse, and                            oxygen saturations were monitored continuously. The                            EG29-[id number] ([id number]) scope was introduced through the                            mouth, and advanced to the second part of duodenum.                            The upper GI endoscopy was accomplished without                            difficulty. The patient tolerated the procedure                            well. Scope In: 4:12:16 PM Scope Out: 4:23:21 PM Total Procedure Duration: 0 hours 11 minutes 5 seconds  Findings:      The examined esophagus was normal.      The Z-line was irregular and was found 34 cm from the incisors.      A 3 cm hiatal hernia was present.      No endoscopic abnormality was evident in the esophagus to explain the       patient's complaint of dysphagia. It was decided, however, to proceed       with dilation of the entire esophagus. The scope was withdrawn. Dilation       was performed with a Maloney dilator with no resistance at 56 Fr. The       dilation site was examined following endoscope reinsertion and showed no       change and no bleeding, mucosal tear or perforation.      The entire examined stomach was normal.      The duodenal bulb and second portion of the duodenum were normal. Impression:               -  Normal esophagus.                           - Z-line irregular, 34 cm from the incisors.                           - 3 cm hiatal hernia.                           - No endoscopic esophageal abnormality to explain                            patient's dysphagia. Esophagus dilated. Dilated.                           - Normal stomach.                           - Normal duodenal bulb and second portion of the                            duodenum.                           - No specimens collected. Moderate Sedation:      Moderate (conscious) sedation was administered by the endoscopy nurse        and supervised by the endoscopist. The following parameters were       monitored: oxygen saturation, heart rate, blood pressure, CO2       capnography and response to care. Total physician intraservice time was       20 minutes. Recommendation:           - Patient has a contact number available for                            emergencies. The signs and symptoms of potential                            delayed complications were discussed with the                            patient. Return to normal activities tomorrow.                            Written discharge instructions were provided to the                            patient.                           - Resume previous diet today.                           - Continue present medications.                           - Resume aspirin at prior dose today.                           -  Telephone GI clinic in 1 week. Procedure Code(s):        --- Professional ---                           [phone], Esophagogastroduodenoscopy, flexible,                            transoral; diagnostic, including collection of                            specimen(s) by brushing or washing, when performed                            (separate procedure)                           43450, Dilation of esophagus, by unguided sound or                            bougie, single or multiple passes                           G0500, Moderate sedation services provided by the                            same physician or other qualified health care                            professional performing a gastrointestinal                            endoscopic service that sedation supports,                            requiring the presence of an independent trained                            observer to assist in the monitoring of the                            patient's level of consciousness and physiological                            status; initial 15 minutes of  intra-service time;                            patient age 35 years or older (additional time may                            be reported with [phone], as appropriate) Diagnosis Code(s):        --- Professional ---                           K22.8, Other specified diseases of esophagus  K44.9, Diaphragmatic hernia without obstruction or                            gangrene                           R13.14, Dysphagia, pharyngoesophageal phase                           K21.9, Gastro-esophageal reflux disease without                            esophagitis CPT copyright 2017 American Medical Association. All rights reserved. The codes documented in this report are preliminary and upon coder review may  be revised to meet current compliance requirements. Hildred Laser, MD Hildred Laser, MD 09/08/2017 4:32:34 PM This report has been signed electronically. Number of Addenda: 0

## 2017-09-08 NOTE — H&P (Signed)
[name] is an 72 y.o. female.   Chief Complaint: Patient is here for EGD and ED. HPI: Patient is 72 year old Caucasian female who has chronic GERD and history of Schatzki's ring which was last dilated in December 2009 who presents with worsening symptoms of heartburn.  She has noticed significant improvement since she has been on omeprazole 40 mg daily.  She was also given sucralfate prescription but co-pay was too high.  She also complains of dysphagia which started about 10 weeks ago.  She has difficulty with solids particularly meat and bread.  She points to the suprasternal area as site of bolus obstruction.  She denies nausea vomiting epigastric pain or melena. Last aspirin dose was 3 days ago.  Past Medical History:  Diagnosis Date  . Anxiety   . Arthritis    "all over; mostly knees and shoulders"  (06/04/2015)   . Family history of adverse reaction to anesthesia    Patients mother had bad N/V after anesthesia  . GERD (gastroesophageal reflux disease)   . High cholesterol   . Hypertension   . Primary localized osteoarthritis of left knee     Past Surgical History:  Procedure Laterality Date  . APPENDECTOMY    . CHOLECYSTECTOMY OPEN     pain  . COLONOSCOPY N/A 04/22/2016   Procedure: COLONOSCOPY;  Surgeon: Rogene Houston, MD;  [facility];  Service: Endoscopy;  Laterality: N/A;  1200  . COLONOSCOPY W/ POLYPECTOMY    . JOINT REPLACEMENT    . KNEE ARTHROSCOPY W/ MENISCECTOMY Left   . KNEE SURGERY Left 1990s  . MASS EXCISION N/A 07/27/2016   Procedure: EXCISION OF SEBACEOUS CYST ON NECK;  Surgeon: Leta Baptist, MD;  Location: [place];  Service: ENT;  Laterality: N/A;  . OOPHORECTOMY     for a cyst ? side  . TOTAL KNEE ARTHROPLASTY Left 06/03/2015  . TOTAL KNEE ARTHROPLASTY Left 06/03/2015   Procedure: TOTAL LEFT KNEE ARTHROPLASTY;  Surgeon: Elsie Saas, MD;  Location: [place];  Service: Orthopedics;  Laterality: Left;  . TUBAL LIGATION      Family History   Problem Relation Age of Onset  . Heart disease Mother   . Diabetes Mellitus II Mother   . Kidney disease Mother   . Cancer Mother   . Hypertension Father   . Cancer Father   . Hypertension Brother   . Hypertension Sister   . Colon cancer Neg Hx    Social History:  reports that she has never smoked. She has never used smokeless tobacco. She reports that she does not drink alcohol or use drugs.  Allergies:  Allergies  Allergen Reactions  . Cefzil [Cefprozil] Rash    Medications Prior to Admission  Medication Sig Dispense Refill  . aspirin EC 81 MG tablet Take 1 tablet (81 mg total) by mouth daily.    Marland Kitchen atorvastatin (LIPITOR) 10 MG tablet Take 10 mg by mouth daily.    . Cholecalciferol (VITAMIN D-3) 5000 units TABS Take 5,000 Units by mouth daily.     Marland Kitchen losartan-hydrochlorothiazide (HYZAAR) 50-12.5 MG per tablet Take 1 tablet by mouth daily.    . montelukast (SINGULAIR) 10 MG tablet Take 10 mg by mouth at bedtime.    . Multiple Vitamins-Minerals (MULTIVITAMIN PO) Take 1 tablet by mouth daily.    Marland Kitchen omeprazole (PRILOSEC) 40 MG capsule Take 1 capsule (40 mg total) by mouth daily. 90 capsule 3  . Probiotic Product (ALIGN PO) Take 1 capsule by mouth  daily.     . sucralfate (CARAFATE) 1 G tablet Take 1 g by mouth 4 (four) times daily as needed (for acid reflux).     Marland Kitchen zolpidem (AMBIEN) 10 MG tablet Take 10 mg by mouth at bedtime.       No results found for this or any previous visit (from the past 48 hour(s)). No results found.  ROS  Blood pressure (!) 151/62, pulse 81, temperature (!) 97.5 F (36.4 C), temperature source Oral, resp. rate 17, height 5\' 5"  (1.651 m), weight 179 lb (81.2 kg), SpO2 99 %. Physical Exam  Constitutional: She appears well-developed and well-nourished.  HENT:  Mouth/Throat: Oropharynx is clear and moist.  Eyes: Conjunctivae are normal. No scleral icterus.  Neck: No thyromegaly present.  Cardiovascular: Normal rate, regular rhythm and normal heart  sounds.  No murmur heard. Respiratory: Effort normal and breath sounds normal.  GI: Soft. She exhibits no distension and no mass.  Mild mid-epigastric tenderness.  Musculoskeletal: She exhibits no edema.  Lymphadenopathy:    She has no cervical adenopathy.  Neurological: She is alert.  Skin: Skin is warm and dry.     Assessment/Plan Solid food dysphagia in patient with chronic GERD. EGD with ED.  Hildred Laser, MD 09/08/2017, 3:58 PM

## 2017-09-13 ENCOUNTER — Telehealth (INDEPENDENT_AMBULATORY_CARE_PROVIDER_SITE_OTHER): Payer: Self-pay | Admitting: Internal Medicine

## 2017-09-13 NOTE — Telephone Encounter (Signed)
Patient called again

## 2017-09-13 NOTE — Telephone Encounter (Signed)
No answer. Will try later

## 2017-09-13 NOTE — Telephone Encounter (Signed)
Samples of Dexilant to front desk. 

## 2017-09-13 NOTE — Telephone Encounter (Signed)
Patient called stating she is still having trouble with her throat - please call ph#[phone]

## 2017-09-15 ENCOUNTER — Encounter (HOSPITAL_COMMUNITY): Payer: Self-pay | Admitting: Internal Medicine

## 2017-09-17 DIAGNOSIS — R070 Pain in throat: Secondary | ICD-10-CM | POA: Diagnosis not present

## 2017-09-17 DIAGNOSIS — E785 Hyperlipidemia, unspecified: Secondary | ICD-10-CM | POA: Diagnosis not present

## 2017-10-21 DIAGNOSIS — M1711 Unilateral primary osteoarthritis, right knee: Secondary | ICD-10-CM | POA: Diagnosis not present

## 2017-11-23 DIAGNOSIS — I10 Essential (primary) hypertension: Secondary | ICD-10-CM | POA: Diagnosis not present

## 2017-11-23 DIAGNOSIS — J060 Acute laryngopharyngitis: Secondary | ICD-10-CM | POA: Diagnosis not present

## 2017-12-20 DIAGNOSIS — R070 Pain in throat: Secondary | ICD-10-CM | POA: Diagnosis not present

## 2017-12-20 DIAGNOSIS — E785 Hyperlipidemia, unspecified: Secondary | ICD-10-CM | POA: Diagnosis not present

## 2017-12-20 DIAGNOSIS — I10 Essential (primary) hypertension: Secondary | ICD-10-CM | POA: Diagnosis not present

## 2017-12-20 DIAGNOSIS — F5101 Primary insomnia: Secondary | ICD-10-CM | POA: Diagnosis not present

## 2017-12-20 DIAGNOSIS — K219 Gastro-esophageal reflux disease without esophagitis: Secondary | ICD-10-CM | POA: Diagnosis not present

## 2017-12-22 DIAGNOSIS — F5101 Primary insomnia: Secondary | ICD-10-CM | POA: Diagnosis not present

## 2017-12-22 DIAGNOSIS — K219 Gastro-esophageal reflux disease without esophagitis: Secondary | ICD-10-CM | POA: Diagnosis not present

## 2017-12-22 DIAGNOSIS — Z0001 Encounter for general adult medical examination with abnormal findings: Secondary | ICD-10-CM | POA: Diagnosis not present

## 2017-12-22 DIAGNOSIS — E785 Hyperlipidemia, unspecified: Secondary | ICD-10-CM | POA: Diagnosis not present

## 2017-12-22 DIAGNOSIS — I10 Essential (primary) hypertension: Secondary | ICD-10-CM | POA: Diagnosis not present

## 2018-02-05 DIAGNOSIS — R52 Pain, unspecified: Secondary | ICD-10-CM | POA: Diagnosis not present

## 2018-02-05 DIAGNOSIS — K1379 Other lesions of oral mucosa: Secondary | ICD-10-CM | POA: Diagnosis not present

## 2018-02-22 DIAGNOSIS — R070 Pain in throat: Secondary | ICD-10-CM | POA: Diagnosis not present

## 2018-03-04 DIAGNOSIS — Z23 Encounter for immunization: Secondary | ICD-10-CM | POA: Diagnosis not present

## 2018-03-30 DIAGNOSIS — R070 Pain in throat: Secondary | ICD-10-CM | POA: Diagnosis not present

## 2018-04-25 ENCOUNTER — Ambulatory Visit (INDEPENDENT_AMBULATORY_CARE_PROVIDER_SITE_OTHER): Payer: Medicare Other | Admitting: Internal Medicine

## 2018-04-25 ENCOUNTER — Encounter (INDEPENDENT_AMBULATORY_CARE_PROVIDER_SITE_OTHER): Payer: Self-pay | Admitting: Internal Medicine

## 2018-04-25 VITALS — BP 160/72 | HR 72 | Temp 98.7°F | Ht 65.0 in | Wt 191.9 lb

## 2018-04-25 DIAGNOSIS — K219 Gastro-esophageal reflux disease without esophagitis: Secondary | ICD-10-CM

## 2018-04-25 DIAGNOSIS — K5732 Diverticulitis of large intestine without perforation or abscess without bleeding: Secondary | ICD-10-CM

## 2018-04-25 MED ORDER — DEXLANSOPRAZOLE 60 MG PO CPDR: 60.0000 mg | DELAYED_RELEASE_CAPSULE | Freq: Every day | ORAL | 3 refills | Status: DC

## 2018-04-25 MED ORDER — CIPROFLOXACIN HCL 500 MG PO TABS: 500.0000 mg | ORAL_TABLET | Freq: Two times a day (BID) | ORAL | 0 refills | Status: DC

## 2018-04-25 MED ORDER — METRONIDAZOLE 250 MG PO TABS: 250.0000 mg | ORAL_TABLET | Freq: Three times a day (TID) | ORAL | 0 refills | Status: DC

## 2018-04-25 NOTE — Patient Instructions (Addendum)
Rx for Cipro and Flagyl sent to her pharmacy. Rx for Dexilant.  If pain increases, go to the ED or call our office.

## 2018-04-25 NOTE — Progress Notes (Signed)
Subjective:    Patient ID: [name], female    DOB: 03/04/1946, 72 y.o.   MRN: [id number]  HPIPresents today with c/o Left abdominal pain and GERD. Last EGD In April of this year which was normal except for a 3cm hiatal hernia.  She was dilated with no change and no bleeding. No mucosal tear or perforation.   Today she says 2 weeks ago she had LLQ pain. Her stool was loose. She says now her BMs are normal. She thinks she may have had some inflammation.   Slight tenderness LLQ. She says she is not having any pain now. No fever. Appetite is good. No weight loss.  Last colonoscopy was in 2017 by Dr. Laural Golden which showed multiple small mouth diverticula and sigmoid and descending. External hemorrhoids. 2 sessile polyps. Biopsy tubular adenoma.   Review of Systems Past Medical History:  Diagnosis Date  . Anxiety   . Arthritis    "all over; mostly knees and shoulders"  (06/04/2015)   . Family history of adverse reaction to anesthesia    Patients mother had bad N/V after anesthesia  . GERD (gastroesophageal reflux disease)   . High cholesterol   . Hypertension   . Primary localized osteoarthritis of left knee     Past Surgical History:  Procedure Laterality Date  . APPENDECTOMY    . CHOLECYSTECTOMY OPEN     pain  . COLONOSCOPY N/A 04/22/2016   Procedure: COLONOSCOPY;  Surgeon: Rogene Houston, MD;  [facility];  Service: Endoscopy;  Laterality: N/A;  1200  . COLONOSCOPY W/ POLYPECTOMY    . ESOPHAGOGASTRODUODENOSCOPY N/A 09/08/2017   Procedure: ESOPHAGOGASTRODUODENOSCOPY (EGD) WITH ESOPHAGEAL DILATION;  Surgeon: Rogene Houston, MD;  [facility];  Service: Endoscopy;  Laterality: N/A;  3:00  . JOINT REPLACEMENT    . KNEE ARTHROSCOPY W/ MENISCECTOMY Left   . KNEE SURGERY Left 1990s  . MASS EXCISION N/A 07/27/2016   Procedure: EXCISION OF SEBACEOUS CYST ON NECK;  Surgeon: Leta Baptist, MD;  Location: [place];  Service: ENT;  Laterality: N/A;  .  OOPHORECTOMY     for a cyst ? side  . TOTAL KNEE ARTHROPLASTY Left 06/03/2015  . TOTAL KNEE ARTHROPLASTY Left 06/03/2015   Procedure: TOTAL LEFT KNEE ARTHROPLASTY;  Surgeon: Elsie Saas, MD;  Location: [place];  Service: Orthopedics;  Laterality: Left;  . TUBAL LIGATION      Allergies  Allergen Reactions  . Cefzil [Cefprozil] Rash    Current Outpatient Medications on File Prior to Visit  Medication Sig Dispense Refill  . aspirin EC 81 MG tablet Take 1 tablet (81 mg total) by mouth daily.    Marland Kitchen atorvastatin (LIPITOR) 10 MG tablet Take 10 mg by mouth daily.    . Cholecalciferol (VITAMIN D-3) 5000 units TABS Take 5,000 Units by mouth daily.     . diclofenac (VOLTAREN) 75 MG EC tablet Take 75 mg by mouth as needed.    Marland Kitchen losartan-hydrochlorothiazide (HYZAAR) 50-12.5 MG per tablet Take 1 tablet by mouth daily.    . montelukast (SINGULAIR) 10 MG tablet Take 10 mg by mouth at bedtime.    . Multiple Vitamins-Minerals (MULTIVITAMIN PO) Take 1 tablet by mouth daily.    Marland Kitchen omeprazole (PRILOSEC) 40 MG capsule Take 1 capsule (40 mg total) by mouth daily. 90 capsule 3  . sucralfate (CARAFATE) 1 G tablet Take 1 g by mouth 4 (four) times daily as needed (for acid reflux).     Marland Kitchen  vitamin B-12 (CYANOCOBALAMIN) 100 MCG tablet Take by mouth daily.    Marland Kitchen zolpidem (AMBIEN) 10 MG tablet Take 10 mg by mouth at bedtime.      No current facility-administered medications on file prior to visit.         Objective:   Physical Exam Blood pressure (!) 160/72, pulse 72, temperature 98.7 F (37.1 C), height 5\' 5"  (1.651 m), weight 191 lb 14.4 oz (87 kg).    Alert and oriented. Skin warm and dry. Oral mucosa is moist.   . Sclera anicteric, conjunctivae is pink. Thyroid not enlarged. No cervical lymphadenopathy. Lungs clear. Heart regular rate and rhythm.  Abdomen is soft. Bowel sounds are positive. No hepatomegaly. No abdominal masses felt. Slight tenderness LLQ.  No edema to lower extremities.         Assessment &  Plan:  LLQ pain.  Possible diverticulitis. Much better now. Will call an Rx in for Cipro and Flagyl. If pain worsens, go to the ED. GERD: Rx for Dexilant at patient's request.

## 2018-05-17 ENCOUNTER — Telehealth (INDEPENDENT_AMBULATORY_CARE_PROVIDER_SITE_OTHER): Payer: Self-pay | Admitting: Internal Medicine

## 2018-05-17 DIAGNOSIS — R1030 Lower abdominal pain, unspecified: Secondary | ICD-10-CM

## 2018-05-17 NOTE — Telephone Encounter (Signed)
Ann, CT abdomen/pelvis with CM 

## 2018-05-18 NOTE — Telephone Encounter (Signed)
CT sch'd 06/06/18 at 800 ([phone]), npo 4 hrs, pick up contrast, patient aware

## 2018-06-06 ENCOUNTER — Ambulatory Visit (HOSPITAL_COMMUNITY)
Admission: RE | Admit: 2018-06-06 | Discharge: 2018-06-06 | Disposition: A | Discharge status: Home / Self Care | Payer: Medicare Other | Source: Ambulatory Visit | Attending: Internal Medicine | Admitting: Internal Medicine

## 2018-06-06 DIAGNOSIS — R1030 Lower abdominal pain, unspecified: Secondary | ICD-10-CM | POA: Insufficient documentation

## 2018-06-06 DIAGNOSIS — K4020 Bilateral inguinal hernia, without obstruction or gangrene, not specified as recurrent: Secondary | ICD-10-CM | POA: Diagnosis not present

## 2018-06-06 LAB — POCT I-STAT CREATININE: Creatinine, Ser: 0.8 mg/dL (ref 0.44–1.00)

## 2018-06-06 MED ORDER — IOPAMIDOL (ISOVUE-300) INJECTION 61%
100.0000 mL | Freq: Once | INTRAVENOUS | Status: AC | PRN
  Administered 2018-06-06: 100 mL via INTRAVENOUS

## 2018-06-08 DIAGNOSIS — H9209 Otalgia, unspecified ear: Secondary | ICD-10-CM | POA: Diagnosis not present

## 2018-06-08 DIAGNOSIS — H6122 Impacted cerumen, left ear: Secondary | ICD-10-CM | POA: Diagnosis not present

## 2018-06-08 DIAGNOSIS — R070 Pain in throat: Secondary | ICD-10-CM | POA: Diagnosis not present

## 2018-06-15 DIAGNOSIS — J019 Acute sinusitis, unspecified: Secondary | ICD-10-CM | POA: Diagnosis not present

## 2018-06-15 DIAGNOSIS — R509 Fever, unspecified: Secondary | ICD-10-CM | POA: Diagnosis not present

## 2018-06-24 DIAGNOSIS — I10 Essential (primary) hypertension: Secondary | ICD-10-CM | POA: Diagnosis not present

## 2018-06-24 DIAGNOSIS — E785 Hyperlipidemia, unspecified: Secondary | ICD-10-CM | POA: Diagnosis not present

## 2018-07-01 DIAGNOSIS — K219 Gastro-esophageal reflux disease without esophagitis: Secondary | ICD-10-CM | POA: Diagnosis not present

## 2018-07-01 DIAGNOSIS — E782 Mixed hyperlipidemia: Secondary | ICD-10-CM | POA: Diagnosis not present

## 2018-07-01 DIAGNOSIS — Z23 Encounter for immunization: Secondary | ICD-10-CM | POA: Diagnosis not present

## 2018-07-01 DIAGNOSIS — I10 Essential (primary) hypertension: Secondary | ICD-10-CM | POA: Diagnosis not present

## 2018-07-01 DIAGNOSIS — Z0000 Encounter for general adult medical examination without abnormal findings: Secondary | ICD-10-CM | POA: Diagnosis not present

## 2018-07-11 ENCOUNTER — Ambulatory Visit (INDEPENDENT_AMBULATORY_CARE_PROVIDER_SITE_OTHER): Payer: Medicare Other | Admitting: Internal Medicine

## 2018-07-11 ENCOUNTER — Encounter (INDEPENDENT_AMBULATORY_CARE_PROVIDER_SITE_OTHER): Payer: Self-pay | Admitting: Internal Medicine

## 2018-07-11 VITALS — BP 122/71 | HR 93 | Temp 98.2°F | Ht 65.0 in | Wt 186.0 lb

## 2018-07-11 DIAGNOSIS — K5732 Diverticulitis of large intestine without perforation or abscess without bleeding: Secondary | ICD-10-CM

## 2018-07-11 DIAGNOSIS — K219 Gastro-esophageal reflux disease without esophagitis: Secondary | ICD-10-CM | POA: Diagnosis not present

## 2018-07-11 NOTE — Progress Notes (Signed)
Subjective:    Patient ID: [name], female    DOB: 1945/08/06, 73 y.o.   MRN: [id number]  HPI Here today for f/u. Last seen in November of 2019. Hx of diverticulitis and GERD.  She tells me she is doing good. GERD controlled with Dexilant. Her appetite is good. Has lost 5 pounds since her last visit which was intentional.  BMs are normal. No melena or BRRB.  She occasionally has to take Miralax. Last colonoscopy was in 2017 by Dr. Laural Golden which showed multiple small mouth diverticula and sigmoid and descending. External hemorrhoids. 2 sessile polyps. Biopsy tubular adenoma.  Last EGD In April of this year which was normal except for a 3cm hiatal hernia.  She was dilated with no change and no bleeding. No mucosal tear or perforation.    Review of Systems Past Medical History:  Diagnosis Date  . Anxiety   . Arthritis    "all over; mostly knees and shoulders"  (06/04/2015)   . Family history of adverse reaction to anesthesia    Patients mother had bad N/V after anesthesia  . GERD (gastroesophageal reflux disease)   . High cholesterol   . Hypertension   . Primary localized osteoarthritis of left knee     Past Surgical History:  Procedure Laterality Date  . APPENDECTOMY    . CHOLECYSTECTOMY OPEN     pain  . COLONOSCOPY N/A 04/22/2016   Procedure: COLONOSCOPY;  Surgeon: Rogene Houston, MD;  [facility];  Service: Endoscopy;  Laterality: N/A;  1200  . COLONOSCOPY W/ POLYPECTOMY    . ESOPHAGOGASTRODUODENOSCOPY N/A 09/08/2017   Procedure: ESOPHAGOGASTRODUODENOSCOPY (EGD) WITH ESOPHAGEAL DILATION;  Surgeon: Rogene Houston, MD;  [facility];  Service: Endoscopy;  Laterality: N/A;  3:00  . JOINT REPLACEMENT    . KNEE ARTHROSCOPY W/ MENISCECTOMY Left   . KNEE SURGERY Left 1990s  . MASS EXCISION N/A 07/27/2016   Procedure: EXCISION OF SEBACEOUS CYST ON NECK;  Surgeon: Leta Baptist, MD;  Location: [place];  Service: ENT;  Laterality: N/A;  .  OOPHORECTOMY     for a cyst ? side  . TOTAL KNEE ARTHROPLASTY Left 06/03/2015  . TOTAL KNEE ARTHROPLASTY Left 06/03/2015   Procedure: TOTAL LEFT KNEE ARTHROPLASTY;  Surgeon: Elsie Saas, MD;  Location: [place];  Service: Orthopedics;  Laterality: Left;  . TUBAL LIGATION      Allergies  Allergen Reactions  . Cefzil [Cefprozil] Rash    Current Outpatient Medications on File Prior to Visit  Medication Sig Dispense Refill  . aspirin EC 81 MG tablet Take 1 tablet (81 mg total) by mouth daily.    Marland Kitchen atorvastatin (LIPITOR) 10 MG tablet Take 10 mg by mouth daily.    . Cholecalciferol (VITAMIN D-3) 5000 units TABS Take 5,000 Units by mouth daily.     Marland Kitchen dexlansoprazole (DEXILANT) 60 MG capsule Take 1 capsule (60 mg total) by mouth daily. 90 capsule 3  . diclofenac (VOLTAREN) 75 MG EC tablet Take 75 mg by mouth as needed.    . montelukast (SINGULAIR) 10 MG tablet Take 10 mg by mouth at bedtime.    . Multiple Vitamins-Minerals (MULTIVITAMIN PO) Take 1 tablet by mouth daily.    . sucralfate (CARAFATE) 1 G tablet Take 1 g by mouth 4 (four) times daily as needed (for acid reflux).     . vitamin B-12 (CYANOCOBALAMIN) 100 MCG tablet Take by mouth daily.    Marland Kitchen zolpidem (AMBIEN) 10 MG tablet  Take 10 mg by mouth at bedtime.     . gabapentin (NEURONTIN) 100 MG capsule      No current facility-administered medications on file prior to visit.         Objective:   Physical Exam Blood pressure 122/71, pulse 93, temperature 98.2 F (36.8 C), height 5\' 5"  (1.651 m), weight 186 lb (84.4 kg). Alert and oriented. Skin warm and dry. Oral mucosa is moist.   . Sclera anicteric, conjunctivae is pink. Thyroid not enlarged. No cervical lymphadenopathy. Lungs clear. Heart regular rate and rhythm.  Abdomen is soft. Bowel sounds are positive. No hepatomegaly. No abdominal masses felt. No tenderness.  No edema to lower extremities.           Assessment & Plan:  GERD. Continue the Dexilant. Diverticulitis: She is not  having any problems at this time. She will call if having any problems.  OV in 1 year.

## 2018-07-11 NOTE — Patient Instructions (Signed)
Continue the Dexilant. OV in 1 year. 

## 2018-08-09 DIAGNOSIS — M7581 Other shoulder lesions, right shoulder: Secondary | ICD-10-CM | POA: Diagnosis not present

## 2018-08-29 ENCOUNTER — Ambulatory Visit: Payer: Medicare Other | Admitting: Cardiology

## 2018-09-13 DIAGNOSIS — J019 Acute sinusitis, unspecified: Secondary | ICD-10-CM | POA: Diagnosis not present

## 2018-09-30 DIAGNOSIS — E782 Mixed hyperlipidemia: Secondary | ICD-10-CM | POA: Diagnosis not present

## 2018-09-30 DIAGNOSIS — H9209 Otalgia, unspecified ear: Secondary | ICD-10-CM | POA: Diagnosis not present

## 2018-09-30 DIAGNOSIS — F5101 Primary insomnia: Secondary | ICD-10-CM | POA: Diagnosis not present

## 2018-09-30 DIAGNOSIS — R300 Dysuria: Secondary | ICD-10-CM | POA: Diagnosis not present

## 2018-09-30 DIAGNOSIS — R52 Pain, unspecified: Secondary | ICD-10-CM | POA: Diagnosis not present

## 2018-09-30 DIAGNOSIS — E785 Hyperlipidemia, unspecified: Secondary | ICD-10-CM | POA: Diagnosis not present

## 2018-09-30 DIAGNOSIS — G4700 Insomnia, unspecified: Secondary | ICD-10-CM | POA: Diagnosis not present

## 2018-10-03 ENCOUNTER — Other Ambulatory Visit: Payer: Self-pay | Admitting: Internal Medicine

## 2018-10-03 DIAGNOSIS — R1084 Generalized abdominal pain: Secondary | ICD-10-CM

## 2018-10-03 DIAGNOSIS — N200 Calculus of kidney: Secondary | ICD-10-CM

## 2018-10-07 ENCOUNTER — Ambulatory Visit (HOSPITAL_COMMUNITY)
Admission: RE | Admit: 2018-10-07 | Discharge: 2018-10-07 | Disposition: A | Discharge status: Home / Self Care | Payer: Medicare Other | Source: Ambulatory Visit | Attending: Internal Medicine | Admitting: Internal Medicine

## 2018-10-07 ENCOUNTER — Other Ambulatory Visit: Payer: Self-pay

## 2018-10-07 DIAGNOSIS — N200 Calculus of kidney: Secondary | ICD-10-CM | POA: Diagnosis not present

## 2018-10-07 DIAGNOSIS — R1084 Generalized abdominal pain: Secondary | ICD-10-CM | POA: Diagnosis not present

## 2018-10-07 DIAGNOSIS — R109 Unspecified abdominal pain: Secondary | ICD-10-CM | POA: Diagnosis not present

## 2018-10-10 DIAGNOSIS — N200 Calculus of kidney: Secondary | ICD-10-CM | POA: Diagnosis not present

## 2018-10-21 DIAGNOSIS — J019 Acute sinusitis, unspecified: Secondary | ICD-10-CM | POA: Diagnosis not present

## 2018-11-01 DIAGNOSIS — H40053 Ocular hypertension, bilateral: Secondary | ICD-10-CM | POA: Diagnosis not present

## 2018-12-07 DIAGNOSIS — H353132 Nonexudative age-related macular degeneration, bilateral, intermediate dry stage: Secondary | ICD-10-CM | POA: Diagnosis not present

## 2018-12-07 DIAGNOSIS — H35033 Hypertensive retinopathy, bilateral: Secondary | ICD-10-CM | POA: Diagnosis not present

## 2018-12-07 DIAGNOSIS — H2511 Age-related nuclear cataract, right eye: Secondary | ICD-10-CM | POA: Diagnosis not present

## 2018-12-07 DIAGNOSIS — H25013 Cortical age-related cataract, bilateral: Secondary | ICD-10-CM | POA: Diagnosis not present

## 2018-12-07 DIAGNOSIS — H2513 Age-related nuclear cataract, bilateral: Secondary | ICD-10-CM | POA: Diagnosis not present

## 2018-12-12 ENCOUNTER — Other Ambulatory Visit: Payer: Medicare Other

## 2018-12-12 ENCOUNTER — Other Ambulatory Visit: Payer: Self-pay

## 2018-12-12 DIAGNOSIS — R6889 Other general symptoms and signs: Secondary | ICD-10-CM | POA: Diagnosis not present

## 2018-12-12 DIAGNOSIS — Z20822 Contact with and (suspected) exposure to covid-19: Secondary | ICD-10-CM

## 2018-12-16 LAB — NOVEL CORONAVIRUS, NAA: SARS-CoV-2, NAA: NOT DETECTED

## 2018-12-20 DIAGNOSIS — H25811 Combined forms of age-related cataract, right eye: Secondary | ICD-10-CM | POA: Diagnosis not present

## 2018-12-20 DIAGNOSIS — H25041 Posterior subcapsular polar age-related cataract, right eye: Secondary | ICD-10-CM | POA: Diagnosis not present

## 2018-12-20 DIAGNOSIS — H25013 Cortical age-related cataract, bilateral: Secondary | ICD-10-CM | POA: Diagnosis not present

## 2018-12-20 DIAGNOSIS — H2511 Age-related nuclear cataract, right eye: Secondary | ICD-10-CM | POA: Diagnosis not present

## 2018-12-20 DIAGNOSIS — H2513 Age-related nuclear cataract, bilateral: Secondary | ICD-10-CM | POA: Diagnosis not present

## 2019-01-03 DIAGNOSIS — J019 Acute sinusitis, unspecified: Secondary | ICD-10-CM | POA: Diagnosis not present

## 2019-01-11 DIAGNOSIS — H2512 Age-related nuclear cataract, left eye: Secondary | ICD-10-CM | POA: Diagnosis not present

## 2019-01-11 DIAGNOSIS — H25012 Cortical age-related cataract, left eye: Secondary | ICD-10-CM | POA: Diagnosis not present

## 2019-01-17 DIAGNOSIS — H2512 Age-related nuclear cataract, left eye: Secondary | ICD-10-CM | POA: Diagnosis not present

## 2019-01-17 DIAGNOSIS — H25012 Cortical age-related cataract, left eye: Secondary | ICD-10-CM | POA: Diagnosis not present

## 2019-01-17 DIAGNOSIS — H25812 Combined forms of age-related cataract, left eye: Secondary | ICD-10-CM | POA: Diagnosis not present

## 2019-02-07 DIAGNOSIS — Z961 Presence of intraocular lens: Secondary | ICD-10-CM | POA: Diagnosis not present

## 2019-03-06 DIAGNOSIS — E785 Hyperlipidemia, unspecified: Secondary | ICD-10-CM | POA: Diagnosis not present

## 2019-03-06 DIAGNOSIS — E782 Mixed hyperlipidemia: Secondary | ICD-10-CM | POA: Diagnosis not present

## 2019-03-06 DIAGNOSIS — I10 Essential (primary) hypertension: Secondary | ICD-10-CM | POA: Diagnosis not present

## 2019-03-10 DIAGNOSIS — G4700 Insomnia, unspecified: Secondary | ICD-10-CM | POA: Diagnosis not present

## 2019-03-10 DIAGNOSIS — E782 Mixed hyperlipidemia: Secondary | ICD-10-CM | POA: Diagnosis not present

## 2019-03-10 DIAGNOSIS — I10 Essential (primary) hypertension: Secondary | ICD-10-CM | POA: Diagnosis not present

## 2019-03-10 DIAGNOSIS — Z0001 Encounter for general adult medical examination with abnormal findings: Secondary | ICD-10-CM | POA: Diagnosis not present

## 2019-03-10 DIAGNOSIS — K219 Gastro-esophageal reflux disease without esophagitis: Secondary | ICD-10-CM | POA: Diagnosis not present

## 2019-03-15 ENCOUNTER — Telehealth (INDEPENDENT_AMBULATORY_CARE_PROVIDER_SITE_OTHER): Payer: Self-pay | Admitting: Internal Medicine

## 2019-03-15 NOTE — Telephone Encounter (Signed)
Patient called stated she talked to someone last week and was told she would be put on a cancellation list.  Stated she doesn't understand why because she is already Dr Olevia Perches patient.  Was told the schedules are full until January.  Stated she does not believe that.  Was told I could have my administrator call and speak with her - patient stated she would not believe any one of [place] here in this office.

## 2019-03-16 DIAGNOSIS — N390 Urinary tract infection, site not specified: Secondary | ICD-10-CM | POA: Diagnosis not present

## 2019-03-24 ENCOUNTER — Other Ambulatory Visit (HOSPITAL_COMMUNITY): Payer: Self-pay | Admitting: Internal Medicine

## 2019-03-24 ENCOUNTER — Other Ambulatory Visit: Payer: Self-pay | Admitting: Internal Medicine

## 2019-03-24 DIAGNOSIS — R509 Fever, unspecified: Secondary | ICD-10-CM

## 2019-03-24 DIAGNOSIS — R1032 Left lower quadrant pain: Secondary | ICD-10-CM

## 2019-03-24 DIAGNOSIS — R110 Nausea: Secondary | ICD-10-CM

## 2019-04-03 DIAGNOSIS — M546 Pain in thoracic spine: Secondary | ICD-10-CM | POA: Diagnosis not present

## 2019-04-03 DIAGNOSIS — M9901 Segmental and somatic dysfunction of cervical region: Secondary | ICD-10-CM | POA: Diagnosis not present

## 2019-04-03 DIAGNOSIS — M542 Cervicalgia: Secondary | ICD-10-CM | POA: Diagnosis not present

## 2019-04-03 DIAGNOSIS — M9902 Segmental and somatic dysfunction of thoracic region: Secondary | ICD-10-CM | POA: Diagnosis not present

## 2019-04-05 DIAGNOSIS — M9902 Segmental and somatic dysfunction of thoracic region: Secondary | ICD-10-CM | POA: Diagnosis not present

## 2019-04-05 DIAGNOSIS — M542 Cervicalgia: Secondary | ICD-10-CM | POA: Diagnosis not present

## 2019-04-05 DIAGNOSIS — M9901 Segmental and somatic dysfunction of cervical region: Secondary | ICD-10-CM | POA: Diagnosis not present

## 2019-04-05 DIAGNOSIS — M546 Pain in thoracic spine: Secondary | ICD-10-CM | POA: Diagnosis not present

## 2019-04-06 DIAGNOSIS — E785 Hyperlipidemia, unspecified: Secondary | ICD-10-CM | POA: Diagnosis not present

## 2019-04-06 DIAGNOSIS — F5101 Primary insomnia: Secondary | ICD-10-CM | POA: Diagnosis not present

## 2019-04-06 DIAGNOSIS — K219 Gastro-esophageal reflux disease without esophagitis: Secondary | ICD-10-CM | POA: Diagnosis not present

## 2019-04-06 DIAGNOSIS — I10 Essential (primary) hypertension: Secondary | ICD-10-CM | POA: Diagnosis not present

## 2019-04-07 DIAGNOSIS — M9901 Segmental and somatic dysfunction of cervical region: Secondary | ICD-10-CM | POA: Diagnosis not present

## 2019-04-07 DIAGNOSIS — M542 Cervicalgia: Secondary | ICD-10-CM | POA: Diagnosis not present

## 2019-04-07 DIAGNOSIS — M546 Pain in thoracic spine: Secondary | ICD-10-CM | POA: Diagnosis not present

## 2019-04-07 DIAGNOSIS — M9902 Segmental and somatic dysfunction of thoracic region: Secondary | ICD-10-CM | POA: Diagnosis not present

## 2019-04-10 DIAGNOSIS — M9901 Segmental and somatic dysfunction of cervical region: Secondary | ICD-10-CM | POA: Diagnosis not present

## 2019-04-10 DIAGNOSIS — M542 Cervicalgia: Secondary | ICD-10-CM | POA: Diagnosis not present

## 2019-04-10 DIAGNOSIS — M546 Pain in thoracic spine: Secondary | ICD-10-CM | POA: Diagnosis not present

## 2019-04-10 DIAGNOSIS — M9902 Segmental and somatic dysfunction of thoracic region: Secondary | ICD-10-CM | POA: Diagnosis not present

## 2019-04-12 ENCOUNTER — Other Ambulatory Visit: Payer: Self-pay

## 2019-04-12 ENCOUNTER — Ambulatory Visit (HOSPITAL_COMMUNITY)
Admission: RE | Admit: 2019-04-12 | Discharge: 2019-04-12 | Disposition: A | Discharge status: Home / Self Care | Payer: Medicare Other | Source: Ambulatory Visit | Attending: Internal Medicine | Admitting: Internal Medicine

## 2019-04-12 DIAGNOSIS — R509 Fever, unspecified: Secondary | ICD-10-CM | POA: Diagnosis not present

## 2019-04-12 DIAGNOSIS — R1032 Left lower quadrant pain: Secondary | ICD-10-CM | POA: Diagnosis not present

## 2019-04-12 DIAGNOSIS — R109 Unspecified abdominal pain: Secondary | ICD-10-CM | POA: Diagnosis not present

## 2019-04-12 DIAGNOSIS — R110 Nausea: Secondary | ICD-10-CM | POA: Diagnosis not present

## 2019-04-12 LAB — POCT I-STAT CREATININE: Creatinine, Ser: 0.7 mg/dL (ref 0.44–1.00)

## 2019-04-12 MED ORDER — IOHEXOL 300 MG/ML  SOLN
100.0000 mL | Freq: Once | INTRAMUSCULAR | Status: AC | PRN
  Administered 2019-04-12: 100 mL via INTRAVENOUS

## 2019-04-13 DIAGNOSIS — M9901 Segmental and somatic dysfunction of cervical region: Secondary | ICD-10-CM | POA: Diagnosis not present

## 2019-04-13 DIAGNOSIS — M9902 Segmental and somatic dysfunction of thoracic region: Secondary | ICD-10-CM | POA: Diagnosis not present

## 2019-04-13 DIAGNOSIS — M542 Cervicalgia: Secondary | ICD-10-CM | POA: Diagnosis not present

## 2019-04-13 DIAGNOSIS — M546 Pain in thoracic spine: Secondary | ICD-10-CM | POA: Diagnosis not present

## 2019-04-18 DIAGNOSIS — R21 Rash and other nonspecific skin eruption: Secondary | ICD-10-CM | POA: Diagnosis not present

## 2019-04-20 DIAGNOSIS — L258 Unspecified contact dermatitis due to other agents: Secondary | ICD-10-CM | POA: Diagnosis not present

## 2019-05-09 DIAGNOSIS — H43392 Other vitreous opacities, left eye: Secondary | ICD-10-CM | POA: Diagnosis not present

## 2019-05-19 DIAGNOSIS — I10 Essential (primary) hypertension: Secondary | ICD-10-CM | POA: Diagnosis not present

## 2019-05-19 DIAGNOSIS — E785 Hyperlipidemia, unspecified: Secondary | ICD-10-CM | POA: Diagnosis not present

## 2019-05-22 DIAGNOSIS — M1712 Unilateral primary osteoarthritis, left knee: Secondary | ICD-10-CM | POA: Diagnosis not present

## 2019-06-05 ENCOUNTER — Encounter: Payer: Self-pay | Admitting: Cardiology

## 2019-06-05 ENCOUNTER — Telehealth (INDEPENDENT_AMBULATORY_CARE_PROVIDER_SITE_OTHER): Payer: Medicare Other | Admitting: Family Medicine

## 2019-06-05 VITALS — BP 130/70 | HR 72 | Ht 65.0 in | Wt 180.0 lb

## 2019-06-05 DIAGNOSIS — R0789 Other chest pain: Secondary | ICD-10-CM | POA: Diagnosis not present

## 2019-06-05 DIAGNOSIS — E782 Mixed hyperlipidemia: Secondary | ICD-10-CM | POA: Diagnosis not present

## 2019-06-05 DIAGNOSIS — Z79899 Other long term (current) drug therapy: Secondary | ICD-10-CM | POA: Diagnosis not present

## 2019-06-05 DIAGNOSIS — I10 Essential (primary) hypertension: Secondary | ICD-10-CM | POA: Diagnosis not present

## 2019-06-05 NOTE — Progress Notes (Signed)
Virtual Visit via Telephone Note   This visit type was conducted due to national recommendations for restrictions regarding the COVID-19 Pandemic (e.g. social distancing) in an effort to limit this patient's exposure and mitigate transmission in our community.  Due to her co-morbid illnesses, this patient is at least at moderate risk for complications without adequate follow up.  This format is felt to be most appropriate for this patient at this time.  The patient did not have access to video technology/had technical difficulties with video requiring transitioning to audio format only (telephone).  All issues noted in this document were discussed and addressed.  No physical exam could be performed with this format.  Please refer to the patient's chart for her  consent to telehealth for [facility]   Date:  06/05/2019   ID:  [name], DOB Sep 26, 1945, MRN [id number]  Patient Location: Home Provider Location: Office  PCP:  Celene Squibb, MD  Cardiologist:  Carlyle Dolly, MD  Electrophysiologist:  None   Evaluation Performed:  Follow-Up Visit  Chief Complaint: Follow-up  History of Present Illness:    [name] is a 74 y.o. female last office visit with Dr. Harl Bowie August 12, 2017.  She was being seen as a new consult referred by PCP for chest pain.  History of hypertension, hyperlipidemia, GERD, dysphagia.  She had visited the emergency room in February 2019 for complaints of chest pain and ruled out for ACS.  Patient had an upper endoscopy September 08, 2017 which was normal.  She had a small 3 cm hiatal hernia.  She voices no recent progressive anginal or exertional symptoms.  States blood pressure has been within normal limits.  Recent visit with PCP per patient.  She states all of her lab work was within normal limits at PCP office.  The patient does not have symptoms concerning for COVID-19 infection (fever, chills, cough, or new shortness of breath).    Past Medical History:    Diagnosis Date  . Anxiety   . Arthritis    "all over; mostly knees and shoulders"  (06/04/2015)   . Family history of adverse reaction to anesthesia    Patients mother had bad N/V after anesthesia  . GERD (gastroesophageal reflux disease)   . High cholesterol   . Hypertension   . Primary localized osteoarthritis of left knee    Past Surgical History:  Procedure Laterality Date  . APPENDECTOMY    . CHOLECYSTECTOMY OPEN     pain  . COLONOSCOPY N/A 04/22/2016   Procedure: COLONOSCOPY;  Surgeon: Rogene Houston, MD;  [facility];  Service: Endoscopy;  Laterality: N/A;  1200  . COLONOSCOPY W/ POLYPECTOMY    . ESOPHAGOGASTRODUODENOSCOPY N/A 09/08/2017   Procedure: ESOPHAGOGASTRODUODENOSCOPY (EGD) WITH ESOPHAGEAL DILATION;  Surgeon: Rogene Houston, MD;  [facility];  Service: Endoscopy;  Laterality: N/A;  3:00  . JOINT REPLACEMENT    . KNEE ARTHROSCOPY W/ MENISCECTOMY Left   . KNEE SURGERY Left 1990s  . MASS EXCISION N/A 07/27/2016   Procedure: EXCISION OF SEBACEOUS CYST ON NECK;  Surgeon: Leta Baptist, MD;  Location: [place];  Service: ENT;  Laterality: N/A;  . OOPHORECTOMY     for a cyst ? side  . TOTAL KNEE ARTHROPLASTY Left 06/03/2015  . TOTAL KNEE ARTHROPLASTY Left 06/03/2015   Procedure: TOTAL LEFT KNEE ARTHROPLASTY;  Surgeon: Elsie Saas, MD;  Location: [place];  Service: Orthopedics;  Laterality: Left;  . TUBAL LIGATION  Current Meds  Medication Sig  . aspirin EC 81 MG tablet Take 1 tablet (81 mg total) by mouth daily.  Marland Kitchen atorvastatin (LIPITOR) 10 MG tablet Take 10 mg by mouth daily.  . Cholecalciferol (VITAMIN D-3) 5000 units TABS Take 5,000 Units by mouth daily.   Marland Kitchen dexlansoprazole (DEXILANT) 60 MG capsule Take 1 capsule (60 mg total) by mouth daily.  . diclofenac (VOLTAREN) 75 MG EC tablet Take 75 mg by mouth as needed.  . gabapentin (NEURONTIN) 100 MG capsule Take 100 mg by mouth at bedtime.   Marland Kitchen losartan-hydrochlorothiazide (HYZAAR)  50-12.5 MG tablet Take 1 tablet by mouth daily.  . montelukast (SINGULAIR) 10 MG tablet Take 10 mg by mouth at bedtime.  . Multiple Vitamins-Minerals (MULTIVITAMIN PO) Take 1 tablet by mouth daily.  . sucralfate (CARAFATE) 1 G tablet Take 1 g by mouth daily.   Marland Kitchen zolpidem (AMBIEN) 10 MG tablet Take 10 mg by mouth at bedtime.   . [DISCONTINUED] hydrochlorothiazide (HYDRODIURIL) 12.5 MG tablet Take 12.5 mg by mouth daily.     Allergies:   Cefzil [cefprozil]   Social History   Tobacco Use  . Smoking status: Never Smoker  . Smokeless tobacco: Never Used  Substance Use Topics  . Alcohol use: No    Alcohol/week: 0.0 standard drinks  . Drug use: No     Family Hx: The patient's family history includes Cancer in her father and mother; Diabetes Mellitus II in her mother; Heart disease in her mother; Hypertension in her brother, father, and sister; Kidney disease in her mother. There is no history of Colon cancer.  ROS:   Please see the history of present illness.    All other systems reviewed and are negative.   Prior CV studies:   The following studies were reviewed today:  Cardiac catheterization 2002 PROCEDURES PERFORMED: Outpatient cardiac catheterization consisting of: 1. Selective coronary angiography. 2. Retrograde left heart catheterization. 3. Left ventricular angiography. 4. Abdominal aortography.  CARDIOLOGIST: Bryson Dames, M.D.  COMPLICATIONS: None.  ENTRY SITE: Right femoral.  DYE USED: Omnipaque.  PATIENT PROFILE: The patient is a 74 year old woman who had a single episode of chest pain and subsequently had a stress test showing anterior and possible inferior ischemia. She also had a baseline abnormal EKG and a high LDL cholesterol.  The patient entered the catheterization lab today as an outpatient electively for diagnostic catheterization.  RESULTS:  PRESSURES: The left ventricular pressure was 165/20, central aortic pressure was  165/95. No aortic valve gradient noted by pullback technique.  ANGIOGRAPHIC RESULTS:  The left main coronary artery, left anterior descending coronary artery, and circumflex coronary artery were all angiographically patent. The circumflex consisted of basically a single vessel that trifurcated distally. The LAD gave rise to one major diagonal branch which was patent. The right coronary artery was dominant. No lesions were seen.  The abdominal aorta showed no evidence of aneurysm formation. The renal arteries were normal. The common iliacs were patent.  FINAL DIAGNOSES: 1. Angiographically patent coronary arteries. 2. Normal left ventricular systolic function. 3. Angiographically patent renal arteries.  Labs/Other Tests and Data Reviewed:    EKG:  An ECG dated February 25, 2018 was personally reviewed today and demonstrated:  Normal sinus rhythm rate of 73, anterior infarct, age undetermined.  Recent Labs: 04/12/2019: Creatinine, Ser 0.70   Recent Lipid Panel No results found for: CHOL, TRIG, HDL, CHOLHDL, LDLCALC, LDLDIRECT  Wt Readings from Last 3 Encounters:  06/05/19 180 lb (81.6 kg)  07/11/18 186  lb (84.4 kg)  04/25/18 191 lb 14.4 oz (87 kg)     Objective:    Vital Signs:  BP 130/70   Pulse 72   Ht 5\' 5"  (1.651 m)   Wt 180 lb (81.6 kg)   BMI 29.95 kg/m    VITAL SIGNS:  reviewed  Patient has a normal speech pattern.  No evidence of dyspnea, cough, or wheezing noted.  ASSESSMENT & PLAN:    1. Other chest pain Patient denies any recent progressive anginal or exertional symptoms.  Patient had a subsequent EGD after symptoms of chest pain and was noted to have a small 3 cm hiatal hernia.  She is currently taking Carafate 1 g daily by mouth and Dexilant for reflux disease.  2. Essential hypertension Blood pressure 130/70.  She takes losartan/hydrochlorthiazide 50/12.5 mg daily.  Continue ARB, thiazide diuretic therapy.  3. Mixed hyperlipidemia Taking  atorvastatin 10 mg by mouth daily.  Continue statin therapy.   COVID-19 Education: The signs and symptoms of COVID-19 were discussed with the patient and how to seek care for testing (follow up with PCP or arrange E-visit).  The importance of social distancing was discussed today.  Time:   Today, I have spent 15 minutes with the patient with telehealth technology discussing the above problems.     Medication Adjustments/Labs and Tests Ordered: Current medicines are reviewed at length with the patient today.  Concerns regarding medicines are outlined above.   Tests Ordered: No orders of the defined types were placed in this encounter.   Medication Changes: No orders of the defined types were placed in this encounter.   Follow Up:  Either In Person or Virtual prn  Signed, Verta Ellen, NP  06/05/2019 2:28 PM    [place]

## 2019-06-05 NOTE — Patient Instructions (Signed)
Your physician recommends that you schedule a follow-up appointment in: AS NEEDED WITH DR BRANCH  Your physician recommends that you continue on your current medications as directed. Please refer to the Current Medication list given to you today.  Thank you for choosing [facility]!!    

## 2019-06-07 DIAGNOSIS — F5101 Primary insomnia: Secondary | ICD-10-CM | POA: Diagnosis not present

## 2019-06-07 DIAGNOSIS — I10 Essential (primary) hypertension: Secondary | ICD-10-CM | POA: Diagnosis not present

## 2019-06-07 DIAGNOSIS — D509 Iron deficiency anemia, unspecified: Secondary | ICD-10-CM | POA: Diagnosis not present

## 2019-06-07 DIAGNOSIS — R252 Cramp and spasm: Secondary | ICD-10-CM | POA: Diagnosis not present

## 2019-06-07 DIAGNOSIS — Z23 Encounter for immunization: Secondary | ICD-10-CM | POA: Diagnosis not present

## 2019-06-07 DIAGNOSIS — Z130 Encounter for screening for diseases of the blood and blood-forming organs and certain disorders involving the immune mechanism: Secondary | ICD-10-CM | POA: Diagnosis not present

## 2019-06-07 DIAGNOSIS — E785 Hyperlipidemia, unspecified: Secondary | ICD-10-CM | POA: Diagnosis not present

## 2019-06-07 DIAGNOSIS — K219 Gastro-esophageal reflux disease without esophagitis: Secondary | ICD-10-CM | POA: Diagnosis not present

## 2019-06-19 DIAGNOSIS — E785 Hyperlipidemia, unspecified: Secondary | ICD-10-CM | POA: Diagnosis not present

## 2019-06-19 DIAGNOSIS — I10 Essential (primary) hypertension: Secondary | ICD-10-CM | POA: Diagnosis not present

## 2019-06-20 DIAGNOSIS — K122 Cellulitis and abscess of mouth: Secondary | ICD-10-CM | POA: Diagnosis not present

## 2019-06-20 DIAGNOSIS — R221 Localized swelling, mass and lump, neck: Secondary | ICD-10-CM | POA: Diagnosis not present

## 2019-07-12 ENCOUNTER — Other Ambulatory Visit: Payer: Self-pay

## 2019-07-12 ENCOUNTER — Encounter (INDEPENDENT_AMBULATORY_CARE_PROVIDER_SITE_OTHER): Payer: Self-pay | Admitting: Gastroenterology

## 2019-07-12 ENCOUNTER — Ambulatory Visit (INDEPENDENT_AMBULATORY_CARE_PROVIDER_SITE_OTHER): Payer: Medicare Other | Admitting: Gastroenterology

## 2019-07-12 ENCOUNTER — Ambulatory Visit (INDEPENDENT_AMBULATORY_CARE_PROVIDER_SITE_OTHER): Payer: Medicare Other | Admitting: Nurse Practitioner

## 2019-07-12 VITALS — BP 144/77 | HR 73 | Temp 97.4°F | Ht 65.0 in | Wt 185.5 lb

## 2019-07-12 DIAGNOSIS — K219 Gastro-esophageal reflux disease without esophagitis: Secondary | ICD-10-CM | POA: Diagnosis not present

## 2019-07-12 DIAGNOSIS — K5730 Diverticulosis of large intestine without perforation or abscess without bleeding: Secondary | ICD-10-CM | POA: Diagnosis not present

## 2019-07-12 NOTE — Patient Instructions (Addendum)
-  Try a fiber supplement such as benefiber or citrcuel.  -If still having hard stools with the fiber can start miralax a half dose daily increasing as tolerated with goal to have soft daily BM -Will refill dexilant to pharmacy  -Due for colonoscopy Nov 2022-sooner if any issues

## 2019-07-12 NOTE — Progress Notes (Signed)
Patient profile: [name] is a 74 y.o. female seen for evaluation of GERD-last seen February 2020.  History of Present Illness: [name] is seen today for yearly follow-up of GERD.  She is maintained on Dexilant daily with Carafate once a day as well.  States she has been symptomatic in the past with Prilosec and Nexium and feels Dexilant controls her symptoms very well. With current regimen she is not having any indigestion symptoms.  She has no nausea vomiting or dysphagia.  She tries to be compliant with diet, she avoids late meals.  She uses diclofenac approximately a few times a month for knee pain, has been able to decrease this use since she had her knee replaced 3 years ago.  Does have some chronic mild constipation, usually 2 on the Bristol stool scale and having a hard stool about every other day with straining.  She does use MiraLAX as needed a few times a month.  She reports being treated for diverticulitis by her primary care when she developed left lower quadrant pain with Cipro Flagyl in both October 2020 in January 2021.  She tries to avoid corn and nuts.  She has no rectal bleeding.  Wt Readings from Last 3 Encounters:  07/12/19 185 lb 8 oz (84.1 kg)  06/05/19 180 lb (81.6 kg)  07/11/18 186 lb (84.4 kg)     Last Colonoscopy: 04/2016-Both polyps are tubular adenomas. Results reviewed with patient. Next colonoscopy in 5 years. - Two 4 to 6 mm polyps in the proximal transverse colon and in the ascending colon, removed with a cold snare. Resected and retrieved. - Diverticulosis in the sigmoid colon and in the descending colon. - External hemorrhoids   Last Endoscopy: 08/2017- Normal esophagus. -Z-line irregular, 34 cm from the incisors. -3 cm hiatal hernia.  - No endoscopic esophageal abnormality to explain patient's dysphagia. Esophagus dilated. Dilated.  - Normal stomach. - Normal duodenal bulb and second portion of the duodenum.    Past Medical History:  Past  Medical History:  Diagnosis Date  . Anxiety   . Arthritis    "all over; mostly knees and shoulders"  (06/04/2015)   . Family history of adverse reaction to anesthesia    Patients mother had bad N/V after anesthesia  . GERD (gastroesophageal reflux disease)   . High cholesterol   . Hypertension   . Primary localized osteoarthritis of left knee     Problem List: Patient Active Problem List   Diagnosis Date Noted  . Other chest pain 07/29/2017  . Special screening for malignant neoplasms, colon 01/24/2016  . DJD (degenerative joint disease) of knee 06/03/2015  . Primary localized osteoarthritis of left knee   . Hypertension   . GERD (gastroesophageal reflux disease) 09/11/2014  . High cholesterol 09/11/2014    Past Surgical History: Past Surgical History:  Procedure Laterality Date  . APPENDECTOMY    . CHOLECYSTECTOMY OPEN     pain  . COLONOSCOPY N/A 04/22/2016   Procedure: COLONOSCOPY;  Surgeon: Rogene Houston, MD;  [facility];  Service: Endoscopy;  Laterality: N/A;  1200  . COLONOSCOPY W/ POLYPECTOMY    . ESOPHAGOGASTRODUODENOSCOPY N/A 09/08/2017   Procedure: ESOPHAGOGASTRODUODENOSCOPY (EGD) WITH ESOPHAGEAL DILATION;  Surgeon: Rogene Houston, MD;  [facility];  Service: Endoscopy;  Laterality: N/A;  3:00  . JOINT REPLACEMENT    . KNEE ARTHROSCOPY W/ MENISCECTOMY Left   . KNEE SURGERY Left 1990s  . MASS EXCISION N/A 07/27/2016   Procedure:  EXCISION OF SEBACEOUS CYST ON NECK;  Surgeon: Leta Baptist, MD;  Location: [place];  Service: ENT;  Laterality: N/A;  . OOPHORECTOMY     for a cyst ? side  . TOTAL KNEE ARTHROPLASTY Left 06/03/2015  . TOTAL KNEE ARTHROPLASTY Left 06/03/2015   Procedure: TOTAL LEFT KNEE ARTHROPLASTY;  Surgeon: Elsie Saas, MD;  Location: [place];  Service: Orthopedics;  Laterality: Left;  . TUBAL LIGATION      Allergies: Allergies  Allergen Reactions  . Cefzil [Cefprozil] Rash      Home Medications:  Current  Outpatient Medications:  .  aspirin EC 81 MG tablet, Take 1 tablet (81 mg total) by mouth daily., Disp: , Rfl:  .  Cholecalciferol (VITAMIN D-3) 5000 units TABS, Take 5,000 Units by mouth daily. , Disp: , Rfl:  .  dexlansoprazole (DEXILANT) 60 MG capsule, Take 1 capsule (60 mg total) by mouth daily., Disp: 90 capsule, Rfl: 3 .  diclofenac (VOLTAREN) 75 MG EC tablet, Take 75 mg by mouth as needed., Disp: , Rfl:  .  gabapentin (NEURONTIN) 100 MG capsule, Take 100 mg by mouth at bedtime. , Disp: , Rfl:  .  losartan-hydrochlorothiazide (HYZAAR) 50-12.5 MG tablet, Take 1 tablet by mouth daily., Disp: , Rfl:  .  montelukast (SINGULAIR) 10 MG tablet, Take 10 mg by mouth at bedtime., Disp: , Rfl:  .  Multiple Vitamins-Minerals (PRESERVISION AREDS 2) CAPS, Take by mouth 2 (two) times daily., Disp: , Rfl:  .  rosuvastatin (CRESTOR) 5 MG tablet, Take 5 mg by mouth. Patient take 1 by mouth on Monday , Wednesday , Friday., Disp: , Rfl:  .  sucralfate (CARAFATE) 1 G tablet, Take 1 g by mouth daily. , Disp: , Rfl:  .  zolpidem (AMBIEN) 10 MG tablet, Take 10 mg by mouth at bedtime. , Disp: , Rfl:    Family History: family history includes Cancer in her father and mother; Diabetes Mellitus II in her mother; Heart disease in her mother; Hypertension in her brother, father, and sister; Kidney disease in her mother.    Social History:   reports that she has never smoked. She has never used smokeless tobacco. She reports that she does not drink alcohol or use drugs.   Review of Systems: Constitutional: Denies weight loss/weight gain  Eyes: No changes in vision. ENT: No oral lesions, sore throat.  GI: see HPI.  Heme/Lymph: No easy bruising.  CV: No chest pain.  GU: No hematuria.  Integumentary: No rashes.  Neuro: No headaches.  Psych: No depression/anxiety.  Endocrine: No heat/cold intolerance.  Allergic/Immunologic: No urticaria.  Resp: No cough, SOB.  Musculoskeletal: No joint swelling.    Physical  Examination: BP (!) 144/77 (BP Location: Right Arm, Patient Position: Sitting, Cuff Size: Large)   Pulse 73   Temp (!) 97.4 F (36.3 C) (Temporal)   Ht 5\' 5"  (1.651 m)   Wt 185 lb 8 oz (84.1 kg)   BMI 30.87 kg/m  Gen: NAD, alert and oriented x 4 HEENT: PEERLA, EOMI, Neck: supple, no JVD Chest: CTA bilaterally, no wheezes, crackles, or other adventitious sounds CV: RRR, no m/g/c/r Abd: soft, NT, ND, +BS in all four quadrants; no HSM, guarding, ridigity, or rebound tenderness Ext: no edema, well perfused with 2+ pulses, Skin: no rash or lesions noted on observed skin Lymph: no noted LAD  Data Reviewed:   CT scan 04/12/19-IMPRESSION: 1. Severe sigmoid and descending colon diverticulosis. Mild thickened appearance of the wall of the sigmoid colon  likely represents muscular hypertrophy. Clinical correlation is recommended to exclude a mild acute diverticulitis. No bowel obstruction. 2. Aortic Atherosclerosis (ICD10-I70.0).     Assessment/Plan: Ms. Burgeson is a 74 y.o. female   1.  Diverticulitis/constipatoin-Per patient has history of diverticulitis treatment in Oct 2020 and Jan 2021 by PCP, symptoms resolved with Cipro Flagyl.  She does have underlying constipation, we discussed trying a fiber supplement and increase fluid intake to decrease issues in the future.  If she does not improve with this she will try some MiraLAX.  To notify me if symptoms of left lower quadrant pain return.  2. Hx colon polyps - due 04/2021   3. GERD - asymptomatic on dexilant and carafate 1x/day. Will continue current regimen. Symptomatic w/ other medications in the past. Diet modifications reviewed. Last EGD 2019. No UGI alarm symptoms   Follow-up 1 year-call sooner if needed  I personally performed the service, non-incident to. (WP)  Laurine Blazer, [facility]

## 2019-07-31 DIAGNOSIS — J019 Acute sinusitis, unspecified: Secondary | ICD-10-CM | POA: Diagnosis not present

## 2019-08-15 DIAGNOSIS — J019 Acute sinusitis, unspecified: Secondary | ICD-10-CM | POA: Diagnosis not present

## 2019-08-22 DIAGNOSIS — H43392 Other vitreous opacities, left eye: Secondary | ICD-10-CM | POA: Diagnosis not present

## 2019-09-11 DIAGNOSIS — M9902 Segmental and somatic dysfunction of thoracic region: Secondary | ICD-10-CM | POA: Diagnosis not present

## 2019-09-11 DIAGNOSIS — F5101 Primary insomnia: Secondary | ICD-10-CM | POA: Diagnosis not present

## 2019-09-11 DIAGNOSIS — E785 Hyperlipidemia, unspecified: Secondary | ICD-10-CM | POA: Diagnosis not present

## 2019-09-11 DIAGNOSIS — E782 Mixed hyperlipidemia: Secondary | ICD-10-CM | POA: Diagnosis not present

## 2019-09-11 DIAGNOSIS — M546 Pain in thoracic spine: Secondary | ICD-10-CM | POA: Diagnosis not present

## 2019-09-11 DIAGNOSIS — M9901 Segmental and somatic dysfunction of cervical region: Secondary | ICD-10-CM | POA: Diagnosis not present

## 2019-09-11 DIAGNOSIS — G4700 Insomnia, unspecified: Secondary | ICD-10-CM | POA: Diagnosis not present

## 2019-09-11 DIAGNOSIS — H9209 Otalgia, unspecified ear: Secondary | ICD-10-CM | POA: Diagnosis not present

## 2019-09-11 DIAGNOSIS — M542 Cervicalgia: Secondary | ICD-10-CM | POA: Diagnosis not present

## 2019-09-15 DIAGNOSIS — G4700 Insomnia, unspecified: Secondary | ICD-10-CM | POA: Diagnosis not present

## 2019-09-15 DIAGNOSIS — M9901 Segmental and somatic dysfunction of cervical region: Secondary | ICD-10-CM | POA: Diagnosis not present

## 2019-09-15 DIAGNOSIS — M9902 Segmental and somatic dysfunction of thoracic region: Secondary | ICD-10-CM | POA: Diagnosis not present

## 2019-09-15 DIAGNOSIS — Z0001 Encounter for general adult medical examination with abnormal findings: Secondary | ICD-10-CM | POA: Diagnosis not present

## 2019-09-15 DIAGNOSIS — M542 Cervicalgia: Secondary | ICD-10-CM | POA: Diagnosis not present

## 2019-09-15 DIAGNOSIS — E782 Mixed hyperlipidemia: Secondary | ICD-10-CM | POA: Diagnosis not present

## 2019-09-15 DIAGNOSIS — I10 Essential (primary) hypertension: Secondary | ICD-10-CM | POA: Diagnosis not present

## 2019-09-15 DIAGNOSIS — K219 Gastro-esophageal reflux disease without esophagitis: Secondary | ICD-10-CM | POA: Diagnosis not present

## 2019-09-15 DIAGNOSIS — M546 Pain in thoracic spine: Secondary | ICD-10-CM | POA: Diagnosis not present

## 2019-09-16 DIAGNOSIS — R519 Headache, unspecified: Secondary | ICD-10-CM | POA: Diagnosis not present

## 2019-09-20 DIAGNOSIS — M542 Cervicalgia: Secondary | ICD-10-CM | POA: Diagnosis not present

## 2019-09-20 DIAGNOSIS — M9901 Segmental and somatic dysfunction of cervical region: Secondary | ICD-10-CM | POA: Diagnosis not present

## 2019-09-20 DIAGNOSIS — M546 Pain in thoracic spine: Secondary | ICD-10-CM | POA: Diagnosis not present

## 2019-09-20 DIAGNOSIS — M9902 Segmental and somatic dysfunction of thoracic region: Secondary | ICD-10-CM | POA: Diagnosis not present

## 2019-09-26 DIAGNOSIS — J343 Hypertrophy of nasal turbinates: Secondary | ICD-10-CM | POA: Diagnosis not present

## 2019-09-26 DIAGNOSIS — J310 Chronic rhinitis: Secondary | ICD-10-CM | POA: Diagnosis not present

## 2019-09-26 DIAGNOSIS — H9202 Otalgia, left ear: Secondary | ICD-10-CM | POA: Diagnosis not present

## 2019-09-26 DIAGNOSIS — H903 Sensorineural hearing loss, bilateral: Secondary | ICD-10-CM | POA: Diagnosis not present

## 2019-10-12 DIAGNOSIS — E785 Hyperlipidemia, unspecified: Secondary | ICD-10-CM | POA: Diagnosis not present

## 2019-10-12 DIAGNOSIS — K219 Gastro-esophageal reflux disease without esophagitis: Secondary | ICD-10-CM | POA: Diagnosis not present

## 2019-10-12 DIAGNOSIS — I10 Essential (primary) hypertension: Secondary | ICD-10-CM | POA: Diagnosis not present

## 2019-12-14 DIAGNOSIS — M7581 Other shoulder lesions, right shoulder: Secondary | ICD-10-CM | POA: Diagnosis not present

## 2019-12-29 DIAGNOSIS — I10 Essential (primary) hypertension: Secondary | ICD-10-CM | POA: Diagnosis not present

## 2019-12-29 DIAGNOSIS — K219 Gastro-esophageal reflux disease without esophagitis: Secondary | ICD-10-CM | POA: Diagnosis not present

## 2019-12-29 DIAGNOSIS — E785 Hyperlipidemia, unspecified: Secondary | ICD-10-CM | POA: Diagnosis not present

## 2020-01-08 ENCOUNTER — Other Ambulatory Visit: Payer: Self-pay

## 2020-01-08 ENCOUNTER — Ambulatory Visit (INDEPENDENT_AMBULATORY_CARE_PROVIDER_SITE_OTHER): Payer: Medicare Other

## 2020-01-08 ENCOUNTER — Ambulatory Visit: Payer: Medicare Other | Admitting: Podiatry

## 2020-01-08 DIAGNOSIS — R200 Anesthesia of skin: Secondary | ICD-10-CM

## 2020-01-08 DIAGNOSIS — M79675 Pain in left toe(s): Secondary | ICD-10-CM

## 2020-01-08 DIAGNOSIS — L600 Ingrowing nail: Secondary | ICD-10-CM | POA: Diagnosis not present

## 2020-01-08 DIAGNOSIS — M19071 Primary osteoarthritis, right ankle and foot: Secondary | ICD-10-CM | POA: Diagnosis not present

## 2020-01-08 DIAGNOSIS — M779 Enthesopathy, unspecified: Secondary | ICD-10-CM | POA: Diagnosis not present

## 2020-01-08 DIAGNOSIS — M79671 Pain in right foot: Secondary | ICD-10-CM

## 2020-01-08 DIAGNOSIS — R202 Paresthesia of skin: Secondary | ICD-10-CM

## 2020-01-08 MED ORDER — NEOMYCIN-POLYMYXIN-HC 3.5-10000-1 OT SOLN
OTIC | 1 refills | Status: DC
Start: 2020-01-08 — End: 2020-07-11

## 2020-01-08 MED ORDER — DOXYCYCLINE HYCLATE 100 MG PO TABS: 100.0000 mg | ORAL_TABLET | Freq: Two times a day (BID) | ORAL | 0 refills | Status: DC

## 2020-01-08 NOTE — Patient Instructions (Signed)

## 2020-01-09 ENCOUNTER — Other Ambulatory Visit: Payer: Self-pay | Admitting: Podiatry

## 2020-01-09 DIAGNOSIS — M19071 Primary osteoarthritis, right ankle and foot: Secondary | ICD-10-CM

## 2020-01-09 NOTE — Progress Notes (Signed)
Subjective:   Patient ID: [name], female   DOB: 74 y.o.   MRN: [id number]   HPI 74 year old female presents the office today for 2 concerns.  Initially her primary concern was to the outside aspect of the right foot where she gets some occasional numbness.  She states this started about 1 year ago when she was at the beach.  She is not sure if it is coming from her knee and she needs her knee replacement walking differently.  She states that worse that the day when she is walking but it comes down at night does not wake her up.  No radiating pain or weakness.  Also has concerns of ingrown toenail to left big toe, lateral aspect which has become very tender but denies any drainage or pus or any swelling.  She is previous had a partial nail avulsion on the right side.   Review of Systems  All other systems reviewed and are negative.  Past Medical History:  Diagnosis Date  . Anxiety   . Arthritis    "all over; mostly knees and shoulders"  (06/04/2015)   . Family history of adverse reaction to anesthesia    Patients mother had bad N/V after anesthesia  . GERD (gastroesophageal reflux disease)   . High cholesterol   . Hypertension   . Primary localized osteoarthritis of left knee     Past Surgical History:  Procedure Laterality Date  . APPENDECTOMY    . CHOLECYSTECTOMY OPEN     pain  . COLONOSCOPY N/A 04/22/2016   Procedure: COLONOSCOPY;  Surgeon: Rogene Houston, MD;  [facility];  Service: Endoscopy;  Laterality: N/A;  1200  . COLONOSCOPY W/ POLYPECTOMY    . ESOPHAGOGASTRODUODENOSCOPY N/A 09/08/2017   Procedure: ESOPHAGOGASTRODUODENOSCOPY (EGD) WITH ESOPHAGEAL DILATION;  Surgeon: Rogene Houston, MD;  [facility];  Service: Endoscopy;  Laterality: N/A;  3:00  . JOINT REPLACEMENT    . KNEE ARTHROSCOPY W/ MENISCECTOMY Left   . KNEE SURGERY Left 1990s  . MASS EXCISION N/A 07/27/2016   Procedure: EXCISION OF SEBACEOUS CYST ON NECK;  Surgeon: Leta Baptist, MD;   Location: [place];  Service: ENT;  Laterality: N/A;  . OOPHORECTOMY     for a cyst ? side  . TOTAL KNEE ARTHROPLASTY Left 06/03/2015  . TOTAL KNEE ARTHROPLASTY Left 06/03/2015   Procedure: TOTAL LEFT KNEE ARTHROPLASTY;  Surgeon: Elsie Saas, MD;  Location: [place];  Service: Orthopedics;  Laterality: Left;  . TUBAL LIGATION       Current Outpatient Medications:  .  aspirin EC 81 MG tablet, Take 1 tablet (81 mg total) by mouth daily., Disp: , Rfl:  .  Cholecalciferol (VITAMIN D-3) 5000 units TABS, Take 5,000 Units by mouth daily. , Disp: , Rfl:  .  dexlansoprazole (DEXILANT) 60 MG capsule, Take 1 capsule (60 mg total) by mouth daily., Disp: 90 capsule, Rfl: 3 .  diclofenac (VOLTAREN) 50 MG EC tablet, Take 50 mg by mouth 2 (two) times daily as needed., Disp: , Rfl:  .  diclofenac (VOLTAREN) 75 MG EC tablet, Take 75 mg by mouth as needed., Disp: , Rfl:  .  gabapentin (NEURONTIN) 100 MG capsule, Take 100 mg by mouth at bedtime. , Disp: , Rfl:  .  losartan-hydrochlorothiazide (HYZAAR) 50-12.5 MG tablet, Take 1 tablet by mouth daily., Disp: , Rfl:  .  montelukast (SINGULAIR) 10 MG tablet, Take 10 mg by mouth at bedtime., Disp: , Rfl:  .  Multiple Vitamins-Minerals (PRESERVISION AREDS 2) CAPS, Take by mouth 2 (two) times daily., Disp: , Rfl:  .  rosuvastatin (CRESTOR) 5 MG tablet, Take 5 mg by mouth. Patient take 1 by mouth on Monday , Wednesday , Friday., Disp: , Rfl:  .  sucralfate (CARAFATE) 1 G tablet, Take 1 g by mouth daily. , Disp: , Rfl:  .  zolpidem (AMBIEN) 10 MG tablet, Take 10 mg by mouth at bedtime. , Disp: , Rfl:  .  doxycycline (VIBRA-TABS) 100 MG tablet, Take 1 tablet (100 mg total) by mouth 2 (two) times daily., Disp: 20 tablet, Rfl: 0 .  neomycin-polymyxin-hydrocortisone (CORTISPORIN) OTIC solution, Apply 1-2 drops to toe BID after soaking, Disp: 10 mL, Rfl: 1  Allergies  Allergen Reactions  . Cefzil [Cefprozil] Rash         Objective:  Physical Exam   General: AAO x3, NAD  Dermatological: Incurvation present to lateral aspect of the left hallux toenail and there is minimal edema.  Is no drainage or pus or ascending cellulitis.  There is no open lesions.  Vascular: Dorsalis Pedis artery and Posterior Tibial artery pedal pulses are 2/4 bilateral with immedate capillary fill time. There is no pain with calf compression, swelling, warmth, erythema.   Neruologic: Grossly intact via light touch bilateral. Vibratory intact via tuning fork bilateral. Protective threshold with Semmes Wienstein monofilament intact to all pedal sites bilateral.  Negative Tinel sign  Musculoskeletal: There is a slight decrease in medial arch height on the right side worse than left but it arches to appear to be somewhat different.  There is no area of pinpoint tenderness but subjectively she gets this discomfort from the fifth metatarsal base of the fifth metatarsal head.  There is no edema, erythema.  Muscular strength 5/5 in all groups tested bilateral.  Gait: Unassisted, Nonantalgic.       Assessment:   74 year old female right lateral foot discomfort, numbness; ingrown toenail left lateral hallux     Plan:  -Treatment options discussed including all alternatives, risks, and complications -Etiology of symptoms were discussed -X-rays were obtained and reviewed with the patient.  Arthritic changes present of the midfoot but no evidence of acute fracture of the right foot. -I do think her symptoms are likely biomechanical in the right foot.  We discussed the modifications as well as wearing orthotics.  We discussed Voltaren gel as needed.  I do not think that it is a neuropathy or systemic issue however we will continue to monitor for improvement -At this time, the patient is requesting partial nail removal with chemical matricectomy to the symptomatic portion of the nail. Risks and complications were discussed with the patient for which they understand and written  consent was obtained. Under sterile conditions a total of 3 mL of a mixture of 2% lidocaine plain and 0.5% Marcaine plain was infiltrated in a hallux block fashion. Once anesthetized, the skin was prepped in sterile fashion. A tourniquet was then applied. Next the lateral aspect of hallux nail border was then sharply excised making sure to remove the entire offending nail border. Once the nails were ensured to be removed area was debrided and the underlying skin was intact. There is no purulence identified in the procedure. Next phenol was then applied under standard conditions and copiously irrigated. Silvadene was applied. A dry sterile dressing was applied. After application of the dressing the tourniquet was removed and there is found to be an immediate capillary refill time to the digit. The patient tolerated  the procedure well any complications. Post procedure instructions were discussed the patient for which he verbally understood. Follow-up in one week for nail check or sooner if any problems are to arise. Discussed signs/symptoms of infection and directed to call the office immediately should any occur or go directly to the emergency room. In the meantime, encouraged to call the office with any questions, concerns, changes symptoms.  Trula Slade DPM

## 2020-01-18 ENCOUNTER — Other Ambulatory Visit: Payer: Self-pay

## 2020-01-18 ENCOUNTER — Ambulatory Visit: Payer: Medicare Other | Admitting: Podiatry

## 2020-01-18 DIAGNOSIS — L600 Ingrowing nail: Secondary | ICD-10-CM | POA: Insufficient documentation

## 2020-01-18 NOTE — Patient Instructions (Signed)

## 2020-01-18 NOTE — Progress Notes (Signed)
Subjective: [name] is a 73 y.o.  female returns to office today for follow up evaluation after having left Hallux partial nail avulsion performed. Patient has been soaking using epsom salts and applying topical antibiotic covered with bandaid daily. States she has an ingrown on the right side but no signs of infection. She wants to have that done in the future but wants to wait until the left side is completely well. Patient denies fevers, chills, nausea, vomiting. Denies any calf pain, chest pain, SOB.   Objective:  General: Well developed, nourished, in no acute distress, alert and oriented x3   Dermatology: Skin is warm, dry and supple bilateral. LEFT hallux nail border appears to be clean, dry, with mild granular tissue and surrounding scab. There is no surrounding erythema, edema, drainage/purulence. The remaining nails appear unremarkable at this time. There are no other lesions or other signs of infection present. Incurvation on the right hallux toenail lateral border but no signs of infection.   Neurovascular status: Intact. No lower extremity swelling; No pain with calf compression bilateral.  Musculoskeletal: No tenderness to palpation of the left hallux nail fold. Muscular strength within normal limits bilateral.   Assesement and Plan: S/p partial nail avulsion, doing well.   -Continue soaking in epsom salts twice a day followed by antibiotic ointment and a band-aid. Can leave uncovered at night. Continue this until completely healed.  -If the area has not healed in 2 weeks, call the office for follow-up appointment, or sooner if any problems arise.  -Monitor for any signs/symptoms of infection. Call the office immediately if any occur or go directly to the emergency room. Call with any questions/concerns.  Celesta Gentile, DPM

## 2020-01-30 DIAGNOSIS — K5900 Constipation, unspecified: Secondary | ICD-10-CM | POA: Diagnosis not present

## 2020-01-30 DIAGNOSIS — R10814 Left lower quadrant abdominal tenderness: Secondary | ICD-10-CM | POA: Diagnosis not present

## 2020-02-06 DIAGNOSIS — M9902 Segmental and somatic dysfunction of thoracic region: Secondary | ICD-10-CM | POA: Diagnosis not present

## 2020-02-06 DIAGNOSIS — M9905 Segmental and somatic dysfunction of pelvic region: Secondary | ICD-10-CM | POA: Diagnosis not present

## 2020-02-06 DIAGNOSIS — M9903 Segmental and somatic dysfunction of lumbar region: Secondary | ICD-10-CM | POA: Diagnosis not present

## 2020-02-06 DIAGNOSIS — M545 Low back pain: Secondary | ICD-10-CM | POA: Diagnosis not present

## 2020-02-06 DIAGNOSIS — M546 Pain in thoracic spine: Secondary | ICD-10-CM | POA: Diagnosis not present

## 2020-02-07 DIAGNOSIS — M9905 Segmental and somatic dysfunction of pelvic region: Secondary | ICD-10-CM | POA: Diagnosis not present

## 2020-02-07 DIAGNOSIS — M9903 Segmental and somatic dysfunction of lumbar region: Secondary | ICD-10-CM | POA: Diagnosis not present

## 2020-02-07 DIAGNOSIS — M9902 Segmental and somatic dysfunction of thoracic region: Secondary | ICD-10-CM | POA: Diagnosis not present

## 2020-02-07 DIAGNOSIS — M546 Pain in thoracic spine: Secondary | ICD-10-CM | POA: Diagnosis not present

## 2020-02-07 DIAGNOSIS — M545 Low back pain: Secondary | ICD-10-CM | POA: Diagnosis not present

## 2020-03-15 DIAGNOSIS — I10 Essential (primary) hypertension: Secondary | ICD-10-CM | POA: Diagnosis not present

## 2020-03-15 DIAGNOSIS — F5101 Primary insomnia: Secondary | ICD-10-CM | POA: Diagnosis not present

## 2020-03-15 DIAGNOSIS — K219 Gastro-esophageal reflux disease without esophagitis: Secondary | ICD-10-CM | POA: Diagnosis not present

## 2020-03-15 DIAGNOSIS — Z23 Encounter for immunization: Secondary | ICD-10-CM | POA: Diagnosis not present

## 2020-03-15 DIAGNOSIS — E785 Hyperlipidemia, unspecified: Secondary | ICD-10-CM | POA: Diagnosis not present

## 2020-03-19 DIAGNOSIS — E7849 Other hyperlipidemia: Secondary | ICD-10-CM | POA: Diagnosis not present

## 2020-03-19 DIAGNOSIS — K219 Gastro-esophageal reflux disease without esophagitis: Secondary | ICD-10-CM | POA: Diagnosis not present

## 2020-03-19 DIAGNOSIS — I10 Essential (primary) hypertension: Secondary | ICD-10-CM | POA: Diagnosis not present

## 2020-03-20 DIAGNOSIS — K219 Gastro-esophageal reflux disease without esophagitis: Secondary | ICD-10-CM | POA: Diagnosis not present

## 2020-03-20 DIAGNOSIS — E782 Mixed hyperlipidemia: Secondary | ICD-10-CM | POA: Diagnosis not present

## 2020-03-20 DIAGNOSIS — I10 Essential (primary) hypertension: Secondary | ICD-10-CM | POA: Diagnosis not present

## 2020-03-20 DIAGNOSIS — G4700 Insomnia, unspecified: Secondary | ICD-10-CM | POA: Diagnosis not present

## 2020-04-15 DIAGNOSIS — E785 Hyperlipidemia, unspecified: Secondary | ICD-10-CM | POA: Diagnosis not present

## 2020-04-15 DIAGNOSIS — K219 Gastro-esophageal reflux disease without esophagitis: Secondary | ICD-10-CM | POA: Diagnosis not present

## 2020-04-15 DIAGNOSIS — I10 Essential (primary) hypertension: Secondary | ICD-10-CM | POA: Diagnosis not present

## 2020-05-20 DIAGNOSIS — I10 Essential (primary) hypertension: Secondary | ICD-10-CM | POA: Diagnosis not present

## 2020-05-20 DIAGNOSIS — F5101 Primary insomnia: Secondary | ICD-10-CM | POA: Diagnosis not present

## 2020-05-20 DIAGNOSIS — E785 Hyperlipidemia, unspecified: Secondary | ICD-10-CM | POA: Diagnosis not present

## 2020-05-20 DIAGNOSIS — J0100 Acute maxillary sinusitis, unspecified: Secondary | ICD-10-CM | POA: Diagnosis not present

## 2020-05-20 DIAGNOSIS — K219 Gastro-esophageal reflux disease without esophagitis: Secondary | ICD-10-CM | POA: Diagnosis not present

## 2020-05-31 DIAGNOSIS — E785 Hyperlipidemia, unspecified: Secondary | ICD-10-CM | POA: Diagnosis not present

## 2020-05-31 DIAGNOSIS — J0100 Acute maxillary sinusitis, unspecified: Secondary | ICD-10-CM | POA: Diagnosis not present

## 2020-05-31 DIAGNOSIS — K219 Gastro-esophageal reflux disease without esophagitis: Secondary | ICD-10-CM | POA: Diagnosis not present

## 2020-05-31 DIAGNOSIS — I10 Essential (primary) hypertension: Secondary | ICD-10-CM | POA: Diagnosis not present

## 2020-06-11 DIAGNOSIS — R350 Frequency of micturition: Secondary | ICD-10-CM | POA: Diagnosis not present

## 2020-07-03 DIAGNOSIS — G2581 Restless legs syndrome: Secondary | ICD-10-CM | POA: Insufficient documentation

## 2020-07-03 DIAGNOSIS — G4700 Insomnia, unspecified: Secondary | ICD-10-CM | POA: Insufficient documentation

## 2020-07-03 DIAGNOSIS — J302 Other seasonal allergic rhinitis: Secondary | ICD-10-CM | POA: Insufficient documentation

## 2020-07-03 DIAGNOSIS — I10 Essential (primary) hypertension: Secondary | ICD-10-CM | POA: Insufficient documentation

## 2020-07-11 ENCOUNTER — Other Ambulatory Visit: Payer: Self-pay

## 2020-07-11 ENCOUNTER — Ambulatory Visit (INDEPENDENT_AMBULATORY_CARE_PROVIDER_SITE_OTHER): Payer: Medicare Other | Admitting: Gastroenterology

## 2020-07-11 ENCOUNTER — Encounter (INDEPENDENT_AMBULATORY_CARE_PROVIDER_SITE_OTHER): Payer: Self-pay | Admitting: Gastroenterology

## 2020-07-11 VITALS — BP 149/78 | HR 81 | Temp 98.2°F | Ht 65.0 in | Wt 185.0 lb

## 2020-07-11 DIAGNOSIS — K219 Gastro-esophageal reflux disease without esophagitis: Secondary | ICD-10-CM | POA: Diagnosis not present

## 2020-07-11 MED ORDER — DEXLANSOPRAZOLE 30 MG PO CPDR: 30.0000 mg | DELAYED_RELEASE_CAPSULE | Freq: Every day | ORAL | 3 refills | Status: DC

## 2020-07-11 NOTE — Progress Notes (Signed)
[name], M.D. Gastroenterology & Hepatology [facility] [address] [place], [place] [address]  Primary Care Physician: Celene Squibb, MD [place] [id number]  I will communicate my assessment and recommendations to the referring MD via EMR.  Problems: 1. GERD  History of Present Illness: [name] is a 75 y.o. female with past medical history of anxiety, GERD, hyperlipidemia, hypertension, who presents for follow up of GERD.  The patient was last seen on 07/12/2019. At that time, the patient was continued on Dexilant and Carafate for GERD.  Patient states feeling well and denies having any complaints at the moment. Patient is taking Dexilant 60 mg on a daily basis, denies heartburn, odynophagia or dysphagia. The patient denies having any nausea, vomiting, fever, chills, hematochezia, melena, hematemesis, abdominal distention, abdominal pain, diarrhea, jaundice, pruritus or weight loss.  Last Colonoscopy: 04/2016 - two 4-6 mm polyps in TC and AC, diverticulosis, hemorrhoids. Path tubular adenomas, repeat in 5 years.  Past Medical History: Past Medical History:  Diagnosis Date  . Anxiety   . Arthritis    "all over; mostly knees and shoulders"  (06/04/2015)   . Family history of adverse reaction to anesthesia    Patients mother had bad N/V after anesthesia  . GERD (gastroesophageal reflux disease)   . High cholesterol   . Hypertension   . Primary localized osteoarthritis of left knee     Past Surgical History: Past Surgical History:  Procedure Laterality Date  . APPENDECTOMY    . CHOLECYSTECTOMY OPEN     pain  . COLONOSCOPY N/A 04/22/2016   Procedure: COLONOSCOPY;  Surgeon: Rogene Houston, MD;  [facility];  Service: Endoscopy;  Laterality: N/A;  1200  . COLONOSCOPY W/ POLYPECTOMY    . ESOPHAGOGASTRODUODENOSCOPY N/A 09/08/2017   Procedure: ESOPHAGOGASTRODUODENOSCOPY (EGD) WITH  ESOPHAGEAL DILATION;  Surgeon: Rogene Houston, MD;  [facility];  Service: Endoscopy;  Laterality: N/A;  3:00  . JOINT REPLACEMENT    . KNEE ARTHROSCOPY W/ MENISCECTOMY Left   . KNEE SURGERY Left 1990s  . MASS EXCISION N/A 07/27/2016   Procedure: EXCISION OF SEBACEOUS CYST ON NECK;  Surgeon: Leta Baptist, MD;  Location: [place];  Service: ENT;  Laterality: N/A;  . OOPHORECTOMY     for a cyst ? side  . TOTAL KNEE ARTHROPLASTY Left 06/03/2015  . TOTAL KNEE ARTHROPLASTY Left 06/03/2015   Procedure: TOTAL LEFT KNEE ARTHROPLASTY;  Surgeon: Elsie Saas, MD;  Location: [place];  Service: Orthopedics;  Laterality: Left;  . TUBAL LIGATION      Family History: Family History  Problem Relation Age of Onset  . Heart disease Mother   . Diabetes Mellitus II Mother   . Kidney disease Mother   . Cancer Mother   . Hypertension Father   . Cancer Father   . Hypertension Brother   . Hypertension Sister   . Colon cancer Neg Hx     Social History: Social History   Tobacco Use  Smoking Status Never Smoker  Smokeless Tobacco Never Used   Social History   Substance and Sexual Activity  Alcohol Use No  . Alcohol/week: 0.0 standard drinks   Social History   Substance and Sexual Activity  Drug Use No    Allergies: Allergies  Allergen Reactions  . Cefzil [Cefprozil] Rash    Medications: Current Outpatient Medications  Medication Sig Dispense Refill  . aspirin EC 81 MG tablet  Take 1 tablet (81 mg total) by mouth daily.    . Cholecalciferol (VITAMIN D-3) 5000 units TABS Take 5,000 Units by mouth daily.     Marland Kitchen dexlansoprazole (DEXILANT) 60 MG capsule Take 1 capsule (60 mg total) by mouth daily. 90 capsule 3  . diclofenac (VOLTAREN) 50 MG EC tablet Take 50 mg by mouth 2 (two) times daily as needed.    . gabapentin (NEURONTIN) 100 MG capsule Take 100 mg by mouth at bedtime.     Marland Kitchen losartan-hydrochlorothiazide (HYZAAR) 50-12.5 MG tablet Take 1 tablet by mouth daily.     . montelukast (SINGULAIR) 10 MG tablet Take 10 mg by mouth at bedtime.    . Multiple Vitamins-Minerals (PRESERVISION AREDS 2) CAPS Take by mouth 2 (two) times daily.    . rosuvastatin (CRESTOR) 5 MG tablet Take 5 mg by mouth daily.    . sucralfate (CARAFATE) 1 G tablet Take 1 g by mouth daily.    Marland Kitchen zolpidem (AMBIEN) 10 MG tablet Take 10 mg by mouth at bedtime.     No current facility-administered medications for this visit.    Review of Systems: GENERAL: negative for malaise, night sweats HEENT: No changes in hearing or vision, no nose bleeds or other nasal problems. NECK: Negative for lumps, goiter, pain and significant neck swelling RESPIRATORY: Negative for cough, wheezing CARDIOVASCULAR: Negative for chest pain, leg swelling, palpitations, orthopnea GI: SEE HPI MUSCULOSKELETAL: Negative for joint pain or swelling, back pain, and muscle pain. SKIN: Negative for lesions, rash PSYCH: Negative for sleep disturbance, mood disorder and recent psychosocial stressors. HEMATOLOGY Negative for prolonged bleeding, bruising easily, and swollen nodes. ENDOCRINE: Negative for cold or heat intolerance, polyuria, polydipsia and goiter. NEURO: negative for tremor, gait imbalance, syncope and seizures. The remainder of the review of systems is noncontributory.   Physical Exam: BP (!) 149/78 (BP Location: Left Arm, Patient Position: Sitting, Cuff Size: Large)   Pulse 81   Temp 98.2 F (36.8 C) (Oral)   Ht 5\' 5"  (1.651 m)   Wt 185 lb (83.9 kg)   BMI 30.79 kg/m  GENERAL: The patient is AO x3, in no acute distress. HEENT: Head is normocephalic and atraumatic. EOMI are intact. Mouth is well hydrated and without lesions. NECK: Supple. No masses LUNGS: Clear to auscultation. No presence of rhonchi/wheezing/rales. Adequate chest expansion HEART: RRR, normal s1 and s2. ABDOMEN: Soft, nontender, no guarding, no peritoneal signs, and nondistended. BS +. No masses. EXTREMITIES: Without any cyanosis,  clubbing, rash, lesions or edema. NEUROLOGIC: AOx3, no focal motor deficit. SKIN: no jaundice, no rashes  Imaging/Labs: as above  I personally reviewed and interpreted the available labs, imaging and endoscopic files.  Impression and Plan: [name] is a 75 y.o. female with past medical history of anxiety, GERD, hyperlipidemia, hypertension, who presents for follow up of GERD.  Patient has presented adequate control of her symptoms while taking the Dexilant.  She has been compliant with his medication.  I discussed with her the possibility of decreasing the dosage of her Dexilant to 30 mg daily which she was agreeable to.  I explained to her that if she has recurrent symptoms she can increase the dose back to 60 mg daily.  - Will decrease Dexilant to 30 mg qday. If not achieving same response, patient should call to increase the dose back to 60 mg - Instruction provided in the use of antireflux medication - patient should take medication in the morning 30-45 minutes before eating breakfast. Discussed avoidance of eating  within 2 hours of lying down to sleep and benefit of blocks to elevate head of bed. - Call back in October 2022 to schedule colonoscopy given history of polyps  All questions were answered.      Harvel Quale, MD Gastroenterology and Hepatology [facility]

## 2020-07-11 NOTE — Patient Instructions (Signed)
Will decrease Dexilant to 30 mg qday. If not achieving same response, you should call us to increase the dose back to 60 mg Instruction provided in the use of antireflux medication - patient should take medication in the morning 30-45 minutes before eating breakfast. Discussed avoidance of eating within 2 hours of lying down to sleep and benefit of blocks to elevate head of bed. Call back in October 2022 to schedule colonoscopy

## 2020-09-24 DIAGNOSIS — E66811 Obesity, class 1: Secondary | ICD-10-CM | POA: Insufficient documentation

## 2020-09-25 ENCOUNTER — Other Ambulatory Visit: Payer: Self-pay

## 2020-09-25 ENCOUNTER — Encounter (INDEPENDENT_AMBULATORY_CARE_PROVIDER_SITE_OTHER): Payer: Self-pay | Admitting: Gastroenterology

## 2020-09-25 ENCOUNTER — Ambulatory Visit (INDEPENDENT_AMBULATORY_CARE_PROVIDER_SITE_OTHER): Payer: Medicare Other | Admitting: Gastroenterology

## 2020-09-25 DIAGNOSIS — R1032 Left lower quadrant pain: Secondary | ICD-10-CM | POA: Diagnosis not present

## 2020-09-25 MED ORDER — HYOSCYAMINE SULFATE 0.125 MG SL SUBL: 0.1250 mg | SUBLINGUAL_TABLET | Freq: Four times a day (QID) | SUBLINGUAL | 1 refills | Status: DC | PRN

## 2020-09-25 NOTE — Progress Notes (Signed)
[name], M.D. Gastroenterology & Hepatology [facility] [address] [place], [place] [address]  Primary Care Physician: Celene Squibb, MD [place] [address]  I will communicate my assessment and recommendations to the referring MD via EMR.  Problems: 1. Left lower quadrant abdominal pain. 2. GERD  History of Present Illness: [name] is a 75 y.o. female with past medical history of anxiety, GERD, hyperlipidemia, hypertension, who presents for evaluation of abdominal pain.  The patient was last seen on 07/11/2020. At that time, the patient was advised to decrease Dexilant to 30 mg every day for management of her GERD.  Patient reports that she has presented new onset persistent pain in the lower abdomen for the last 3 weeks, along with with discomfort and "tightness" in the left side of her abdomen.  The patient did not have these before but states she has presented "several similar bouts in the past which were treated with antibiotics".  She was seen in her PCPs office on 09/17/2020, she was given a prescription of ciprofloxacin for management of possible UTI but her culture came back negative.  She reports she had mild improvement of her symptoms after receiving antibiotic, has not taken any other medications including any analgesics. Has not taken any painkillers. She reports that sometimes she feels nauseated but denies any vomiting.   The patient denies having any nausea, vomiting, fever, chills, hematochezia, melena, hematemesis,  diarrhea, jaundice, pruritus or weight loss.  Most recent abdominal imaging was a CT abdomen pelvis with IV contrast which was performed on 04/12/2019 showing severe sigmoid and descending colon diverticulosis. Mild thickened appearance of the wall of the sigmoid colon  Last EGD: 2019 - - 3 cm hiatal hernia. - No endoscopic esophageal abnormality to explain patient's dysphagia.  Esophagus dilated. Dilated. - Normal stomach. - Normal duodenal bulb and second portion of the duodenum. Last Colonoscopy: 04/2016 - two 4-6 mm polyps in TC and AC, diverticulosis, hemorrhoids. Path tubular adenomas, repeat in 5 years.  Past Medical History: Past Medical History:  Diagnosis Date  . Anxiety   . Arthritis    "all over; mostly knees and shoulders"  (06/04/2015)   . Family history of adverse reaction to anesthesia    Patients mother had bad N/V after anesthesia  . GERD (gastroesophageal reflux disease)   . High cholesterol   . Hypertension   . Primary localized osteoarthritis of left knee     Past Surgical History: Past Surgical History:  Procedure Laterality Date  . APPENDECTOMY    . CHOLECYSTECTOMY OPEN     pain  . COLONOSCOPY N/A 04/22/2016   Procedure: COLONOSCOPY;  Surgeon: Rogene Houston, MD;  [facility];  Service: Endoscopy;  Laterality: N/A;  1200  . COLONOSCOPY W/ POLYPECTOMY    . ESOPHAGOGASTRODUODENOSCOPY N/A 09/08/2017   Procedure: ESOPHAGOGASTRODUODENOSCOPY (EGD) WITH ESOPHAGEAL DILATION;  Surgeon: Rogene Houston, MD;  [facility];  Service: Endoscopy;  Laterality: N/A;  3:00  . JOINT REPLACEMENT    . KNEE ARTHROSCOPY W/ MENISCECTOMY Left   . KNEE SURGERY Left 1990s  . MASS EXCISION N/A 07/27/2016   Procedure: EXCISION OF SEBACEOUS CYST ON NECK;  Surgeon: Leta Baptist, MD;  Location: [place] [facility];  Service: ENT;  Laterality: N/A;  . OOPHORECTOMY     for a cyst ? side  . TOTAL KNEE ARTHROPLASTY Left 06/03/2015  . TOTAL KNEE ARTHROPLASTY Left 06/03/2015   Procedure:  TOTAL LEFT KNEE ARTHROPLASTY;  Surgeon: Elsie Saas, MD;  Location: [place];  Service: Orthopedics;  Laterality: Left;  . TUBAL LIGATION      Family History: Family History  Problem Relation Age of Onset  . Heart disease Mother   . Diabetes Mellitus II Mother   . Kidney disease Mother   . Cancer Mother   . Hypertension Father   . Cancer Father   .  Hypertension Brother   . Hypertension Sister   . Colon cancer Neg Hx     Social History: Social History   Tobacco Use  Smoking Status Never Smoker  Smokeless Tobacco Never Used   Social History   Substance and Sexual Activity  Alcohol Use No  . Alcohol/week: 0.0 standard drinks   Social History   Substance and Sexual Activity  Drug Use No    Allergies: Allergies  Allergen Reactions  . Cefzil [Cefprozil] Rash    Medications: Current Outpatient Medications  Medication Sig Dispense Refill  . aspirin EC 81 MG tablet Take 1 tablet (81 mg total) by mouth daily.    . Cholecalciferol (VITAMIN D-3) 5000 units TABS Take 5,000 Units by mouth daily.     Marland Kitchen Dexlansoprazole (DEXILANT) 30 MG capsule Take 1 capsule (30 mg total) by mouth daily. 90 capsule 3  . diclofenac (VOLTAREN) 50 MG EC tablet Take 50 mg by mouth 2 (two) times daily as needed.    . gabapentin (NEURONTIN) 100 MG capsule Take 100 mg by mouth at bedtime.     Marland Kitchen losartan-hydrochlorothiazide (HYZAAR) 50-12.5 MG tablet Take 1 tablet by mouth daily.    . montelukast (SINGULAIR) 10 MG tablet Take 10 mg by mouth at bedtime.    . Multiple Vitamins-Minerals (PRESERVISION AREDS 2) CAPS Take by mouth 2 (two) times daily.    . rosuvastatin (CRESTOR) 5 MG tablet Take 5 mg by mouth daily.    . sucralfate (CARAFATE) 1 G tablet Take 1 g by mouth daily. As needed.    . zolpidem (AMBIEN) 10 MG tablet Take 10 mg by mouth at bedtime.     No current facility-administered medications for this visit.    Review of Systems: GENERAL: negative for malaise, night sweats HEENT: No changes in hearing or vision, no nose bleeds or other nasal problems. NECK: Negative for lumps, goiter, pain and significant neck swelling RESPIRATORY: Negative for cough, wheezing CARDIOVASCULAR: Negative for chest pain, leg swelling, palpitations, orthopnea GI: SEE HPI MUSCULOSKELETAL: Negative for joint pain or swelling, back pain, and muscle pain. SKIN:  Negative for lesions, rash PSYCH: Negative for sleep disturbance, mood disorder and recent psychosocial stressors. HEMATOLOGY Negative for prolonged bleeding, bruising easily, and swollen nodes. ENDOCRINE: Negative for cold or heat intolerance, polyuria, polydipsia and goiter. NEURO: negative for tremor, gait imbalance, syncope and seizures. The remainder of the review of systems is noncontributory.   Physical Exam: BP (!) 144/82 (BP Location: Left Arm, Patient Position: Sitting, Cuff Size: Large)   Pulse 83   Temp 98.3 F (36.8 C) (Oral)   Ht 5\' 5"  (1.651 m)   Wt 184 lb (83.5 kg)   BMI 30.62 kg/m  GENERAL: The patient is AO x3, in no acute distress. HEENT: Head is normocephalic and atraumatic. EOMI are intact. Mouth is well hydrated and without lesions. NECK: Supple. No masses LUNGS: Clear to auscultation. No presence of rhonchi/wheezing/rales. Adequate chest expansion HEART: RRR, normal s1 and s2. ABDOMEN: mildly tender upon palpation of the left inguinal area but no mass is palpated, no  guarding, no peritoneal signs, and nondistended. BS +. No masses. EXTREMITIES: Without any cyanosis, clubbing, rash, lesions or edema. NEUROLOGIC: AOx3, no focal motor deficit. SKIN: no jaundice, no rashes  Imaging/Labs: as above  I personally reviewed and interpreted the available labs, imaging and endoscopic files.  Impression and Plan: [name] is a 75 y.o. female with past medical history of anxiety, GERD, hyperlipidemia, hypertension, who presents for evaluation of abdominal pain.  The etiology of her current symptoms is unclear at this point.  This is very likely related to diverticulitis given that.  Time she has presented these complaints.  Also, she has not responded to antibiotic treatment.  She has not presented any other red flag signs.  We will need to explore this further with a CT of the abdomen and pelvis with IV contrast.  If this fails to show any alterations, it is possible her  symptoms are related to functional etiology.  We will prescribe Levsin as needed to improve her symptoms for now.  She will also benefit from improving her bowel movement frequency by taking MiraLAX every day.  - Start Levsin 1 tablet q8h as needed for abdominal pain - Start taking Miralax 1 cap every day - Schedule CT abdomen/pelvis with IV contrast  All questions were answered.      Harvel Quale, MD Gastroenterology and Hepatology [facility]

## 2020-09-25 NOTE — Patient Instructions (Signed)
Start Levsin 1 tablet q8h as needed for abdominal pain Start taking Miralax 1 cap every day Schedule CT abdomen/pelvis with IV contrast

## 2020-10-22 ENCOUNTER — Ambulatory Visit (HOSPITAL_COMMUNITY)
Admission: RE | Admit: 2020-10-22 | Discharge: 2020-10-22 | Disposition: A | Discharge status: Home / Self Care | Payer: Medicare Other | Source: Ambulatory Visit | Attending: Gastroenterology | Admitting: Gastroenterology

## 2020-10-22 DIAGNOSIS — R1032 Left lower quadrant pain: Secondary | ICD-10-CM | POA: Insufficient documentation

## 2020-10-22 MED ORDER — IOHEXOL 300 MG/ML  SOLN
100.0000 mL | Freq: Once | INTRAMUSCULAR | Status: AC | PRN
  Administered 2020-10-22: 100 mL via INTRAVENOUS

## 2020-10-31 LAB — POCT I-STAT CREATININE: Creatinine, Ser: 0.8 mg/dL (ref 0.44–1.00)

## 2021-03-03 DIAGNOSIS — I890 Lymphedema, not elsewhere classified: Secondary | ICD-10-CM | POA: Insufficient documentation

## 2021-03-26 ENCOUNTER — Encounter (INDEPENDENT_AMBULATORY_CARE_PROVIDER_SITE_OTHER): Payer: Self-pay | Admitting: *Deleted

## 2021-04-30 DIAGNOSIS — M792 Neuralgia and neuritis, unspecified: Secondary | ICD-10-CM | POA: Insufficient documentation

## 2021-06-24 ENCOUNTER — Telehealth (INDEPENDENT_AMBULATORY_CARE_PROVIDER_SITE_OTHER): Payer: Self-pay | Admitting: *Deleted

## 2021-06-24 NOTE — Telephone Encounter (Signed)
Agree with the plans as long as she does not have any symptoms.

## 2021-06-24 NOTE — Telephone Encounter (Signed)
FYI: Called patient to schedule her 5 yr TCS - she was 76 years old and she wasn't going to do TCS

## 2021-09-25 ENCOUNTER — Ambulatory Visit (INDEPENDENT_AMBULATORY_CARE_PROVIDER_SITE_OTHER): Payer: Medicare Other | Admitting: Gastroenterology

## 2021-09-25 ENCOUNTER — Encounter (INDEPENDENT_AMBULATORY_CARE_PROVIDER_SITE_OTHER): Payer: Self-pay | Admitting: Gastroenterology

## 2021-09-25 ENCOUNTER — Encounter (INDEPENDENT_AMBULATORY_CARE_PROVIDER_SITE_OTHER): Payer: Self-pay

## 2021-09-25 ENCOUNTER — Other Ambulatory Visit (INDEPENDENT_AMBULATORY_CARE_PROVIDER_SITE_OTHER): Payer: Self-pay

## 2021-09-25 VITALS — BP 129/69 | HR 77 | Temp 98.8°F | Ht 65.0 in | Wt 186.7 lb

## 2021-09-25 DIAGNOSIS — Z1211 Encounter for screening for malignant neoplasm of colon: Secondary | ICD-10-CM | POA: Diagnosis not present

## 2021-09-25 DIAGNOSIS — K219 Gastro-esophageal reflux disease without esophagitis: Secondary | ICD-10-CM | POA: Diagnosis not present

## 2021-09-25 DIAGNOSIS — Z86010 Personal history of colonic polyps: Secondary | ICD-10-CM | POA: Diagnosis not present

## 2021-09-25 NOTE — Progress Notes (Signed)
? ?Referring Provider: Pablo Lawrence, NP ?Primary Care Physician:  Aimee Lawrence, NP ?Primary GI Physician: [name] ? ?Chief Complaint  ?Patient presents with  ? Follow-up  ?  Patient here today for a follow up visit. She denies any current gi issues.  ? ?HPI:   ?[name] is a 76 y.o. female with past medical history of anxiety, GERD, hyperlipidemia, hypertension ? ?Patient presenting today for follow up of GERD. ? ?Last seen 09/25/20 for follow up of GERD and some lower abdominal pain, reportedly had multiple previous abx for her symptoms without improvement, CT A/P with contrast ordered, Rx levsin Q8H for abdominal pain, miralax 1 capful daily and continued on Dexilant '30mg'$  daily.  ? ?Patient states that she is doing well today. GERD is well controlled on dexilant '30mg'$  daily without breakthrough symptoms. No red flag symptoms. Patient denies melena, hematochezia, nausea, vomiting, diarrhea, constipation, dysphagia, odyonophagia, early satiety or weight loss.  ? ?States that previous abdominal pain subsided, she has not had any recurrence of this pain since.  ? ? ?CT A/P 10/22/20 Rather extensive colonic diverticulosis, primarily involving the ?distal descending colon as well as the sigmoid colon without evidence of superimposed acute diverticulitis. . ?otherwise, no explanation for patient's left lower quadrant abdominal pain. Specifically, no evidence of enteric or urinary obstruction. Punctate (2 mm) nonobstructing right-sided renal stone, similar to the 06/2018 examination.Suspected hemodynamically significant narrowing involving the origin of the bilateral renal arteries, incompletely evaluated on this non CTA examination but without associated asymmetric decreased renal perfusion or renal atrophy. ?Last EGD: 2019 - - 3 cm hiatal hernia. ?- No endoscopic esophageal abnormality to explain patient's dysphagia. Esophagus dilated. ?Dilated. ?- Normal stomach. ?- Normal duodenal bulb and second portion of  the duodenum. ?Last Colonoscopy: 04/2016 - two 4-6 mm polyps in TC and AC, diverticulosis, hemorrhoids. Path tubular adenomas, repeat in 5 years. ? ?Recommendations:  ?Overdue to colonoscopy nov 2022 ? ?Past Medical History:  ?Diagnosis Date  ? Anxiety   ? Arthritis   ? "all over; mostly knees and shoulders"  (06/04/2015)   ? Family history of adverse reaction to anesthesia   ? Patients mother had bad N/V after anesthesia  ? GERD (gastroesophageal reflux disease)   ? High cholesterol   ? Hypertension   ? Primary localized osteoarthritis of left knee   ? ? ?Past Surgical History:  ?Procedure Laterality Date  ? APPENDECTOMY    ? CHOLECYSTECTOMY OPEN    ? pain  ? COLONOSCOPY N/A 04/22/2016  ? Procedure: COLONOSCOPY;  Surgeon: Rogene Houston, MD;  [facility];  Service: Endoscopy;  Laterality: N/A;  1200  ? COLONOSCOPY W/ POLYPECTOMY    ? ESOPHAGOGASTRODUODENOSCOPY N/A 09/08/2017  ? Procedure: ESOPHAGOGASTRODUODENOSCOPY (EGD) WITH ESOPHAGEAL DILATION;  Surgeon: Rogene Houston, MD;  [facility];  Service: Endoscopy;  Laterality: N/A;  3:00  ? JOINT REPLACEMENT    ? KNEE ARTHROSCOPY W/ MENISCECTOMY Left   ? KNEE SURGERY Left 1990s  ? MASS EXCISION N/A 07/27/2016  ? Procedure: EXCISION OF SEBACEOUS CYST ON NECK;  Surgeon: Leta Baptist, MD;  Location: [place];  Service: ENT;  Laterality: N/A;  ? OOPHORECTOMY    ? for a cyst ? side  ? TOTAL KNEE ARTHROPLASTY Left 06/03/2015  ? TOTAL KNEE ARTHROPLASTY Left 06/03/2015  ? Procedure: TOTAL LEFT KNEE ARTHROPLASTY;  Surgeon: Elsie Saas, MD;  Location: [place];  Service: Orthopedics;  Laterality: Left;  ? TUBAL LIGATION    ? ? ?Current Outpatient  Medications  ?Medication Sig Dispense Refill  ? aspirin EC 81 MG tablet Take 1 tablet (81 mg total) by mouth daily.    ? Cholecalciferol (VITAMIN D-3) 5000 units TABS Take 5,000 Units by mouth daily.     ? Dexlansoprazole (DEXILANT) 30 MG capsule Take 1 capsule (30 mg total) by mouth daily. 90 capsule 3  ?  diclofenac (VOLTAREN) 50 MG EC tablet Take 50 mg by mouth 2 (two) times daily as needed.    ? gabapentin (NEURONTIN) 100 MG capsule Take 100 mg by mouth at bedtime.     ? losartan-hydrochlorothiazide (HYZAAR) 50-12.5 MG tablet Take 1 tablet by mouth daily.    ? montelukast (SINGULAIR) 10 MG tablet Take 10 mg by mouth at bedtime.    ? Multiple Vitamins-Minerals (PRESERVISION AREDS 2) CAPS Take by mouth 2 (two) times daily.    ? rosuvastatin (CRESTOR) 5 MG tablet Take 5 mg by mouth daily.    ? sucralfate (CARAFATE) 1 G tablet Take 1 g by mouth daily. As needed.    ? zolpidem (AMBIEN) 10 MG tablet Take 10 mg by mouth at bedtime.    ? hyoscyamine (LEVSIN SL) 0.125 MG SL tablet Place 1 tablet (0.125 mg total) under the tongue every 6 (six) hours as needed (abdominal pain). (Patient not taking: Reported on 09/25/2021) 30 tablet 1  ? ?No current facility-administered medications for this visit.  ? ? ?Allergies as of 09/25/2021 - Review Complete 09/25/2021  ?Allergen Reaction Noted  ? Cefzil [cefprozil] Rash 09/01/2017  ? ? ?Family History  ?Problem Relation Age of Onset  ? Heart disease Mother   ? Diabetes Mellitus II Mother   ? Kidney disease Mother   ? Cancer Mother   ? Hypertension Father   ? Cancer Father   ? Hypertension Brother   ? Hypertension Sister   ? Colon cancer Neg Hx   ? ? ?Social History  ? ?Socioeconomic History  ? Marital status: Married  ?  Spouse name: [name]  ? Number of children: 6  ? Years of education: Not on file  ? Highest education level: Not on file  ?Occupational History  ? Not on file  ?Tobacco Use  ? Smoking status: Never  ? Smokeless tobacco: Never  ?Vaping Use  ? Vaping Use: Never used  ?Substance and Sexual Activity  ? Alcohol use: No  ?  Alcohol/week: 0.0 standard drinks  ? Drug use: No  ? Sexual activity: Not Currently  ?Other Topics Concern  ? Not on file  ?Social History Narrative  ? Lives with husband, [name].  [phone] cell  ? ?Social Determinants of Health  ? ?Financial  Resource Strain: Not on file  ?Food Insecurity: Not on file  ?Transportation Needs: Not on file  ?Physical Activity: Not on file  ?Stress: Not on file  ?Social Connections: Not on file  ? ?Review of systems ?General: negative for malaise, night sweats, fever, chills, weight loss ?Neck: Negative for lumps, goiter, pain and significant neck swelling ?Resp: Negative for cough, wheezing, dyspnea at rest ?CV: Negative for chest pain, leg swelling, palpitations, orthopnea ?GI: denies melena, hematochezia, nausea, vomiting, diarrhea, constipation, dysphagia, odynophagia, early satiety or unintentional weight loss.  ?MSK: Negative for joint pain or swelling, back pain, and muscle pain. ?Derm: Negative for itching or rash ?Psych: Denies depression, anxiety, memory loss, confusion. No homicidal or suicidal ideation.  ?Heme: Negative for prolonged bleeding, bruising easily, and swollen nodes. ?Endocrine: Negative for cold or heat intolerance, polyuria, polydipsia and goiter. ?  Neuro: negative for tremor, gait imbalance, syncope and seizures. ?The remainder of the review of systems is noncontributory. ? ?Physical Exam: ?BP 129/69 (BP Location: Right Arm, Patient Position: Sitting, Cuff Size: Large)   Pulse 77   Temp 98.8 ?F (37.1 ?C) (Oral)   Ht '5\' 5"'$  (1.651 m)   Wt 186 lb 11.2 oz (84.7 kg)   BMI 31.07 kg/m?  ?General:   Alert and oriented. No distress noted. Pleasant and cooperative.  ?Head:  Normocephalic and atraumatic. ?Eyes:  Conjuctiva clear without scleral icterus. ?Mouth:  Oral mucosa pink and moist. Good dentition. No lesions. ?Heart: Normal rate and rhythm, s1 and s2 heart sounds present.  ?Lungs: Clear lung sounds in all lobes. Respirations equal and unlabored. ?Abdomen:  +BS, soft, non-tender and non-distended. No rebound or guarding. No HSM or masses noted. ?Derm: No palmar erythema or jaundice ?Msk:  Symmetrical without gross deformities. Normal posture. ?Extremities:  Without edema. ?Neurologic:  Alert and   oriented x4 ?Psych:  Alert and cooperative. Normal mood and affect. ? ?Invalid input(s): 6 MONTHS  ? ?ASSESSMENT: ?[name] is a 76 y.o. female presenting today for routine follow up of GERD. ? ?GERD well mana

## 2021-09-25 NOTE — Patient Instructions (Signed)
We will get you scheduled for colonoscopy ?Please continue your dexilant '30mg'$  daily ?Please let us know if you have any new or worsening GI symptoms ? ?Will plan to see you back in 1 year ?

## 2021-09-26 ENCOUNTER — Encounter (INDEPENDENT_AMBULATORY_CARE_PROVIDER_SITE_OTHER): Payer: Self-pay

## 2021-09-30 IMAGING — CT CT ABD-PELV W/ CM
2 of 5 series · 14 of 46 positions shown, 16 images · IV contrast (Omnipaque or Isovue)
Comparison: 04/12/2019; 06/06/2018

CLINICAL DATA: Left lower quadrant abdominal pain and nausea for
the past 3 weeks.

EXAM:
CT ABDOMEN AND PELVIS WITH CONTRAST
TECHNIQUE: Multidetector CT imaging of the abdomen and pelvis was performed
using the standard protocol following bolus administration of
intravenous contrast.
CONTRAST:  100mL OMNIPAQUE IOHEXOL 300 MG/ML  SOLN

[Series 2: axial st · axial · 0.76mm/px · z∈[+783,+1203]mm · 11 of 96 slices shown, 13 images]
[im 6/96  soft-tissue]
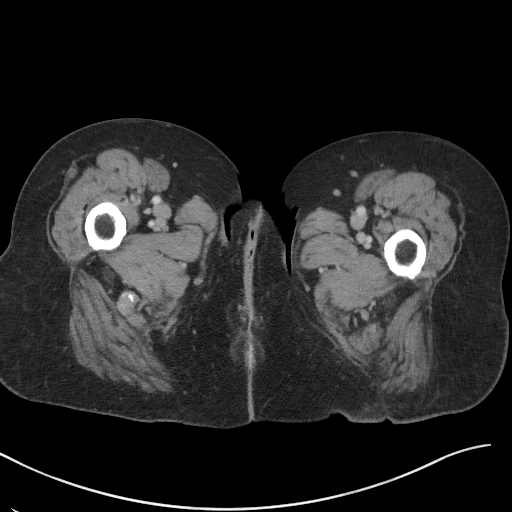
[im 6/96  bone]
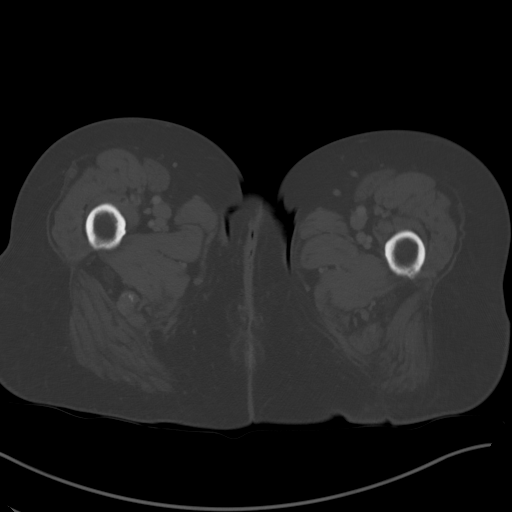
[im 17/96  soft-tissue]
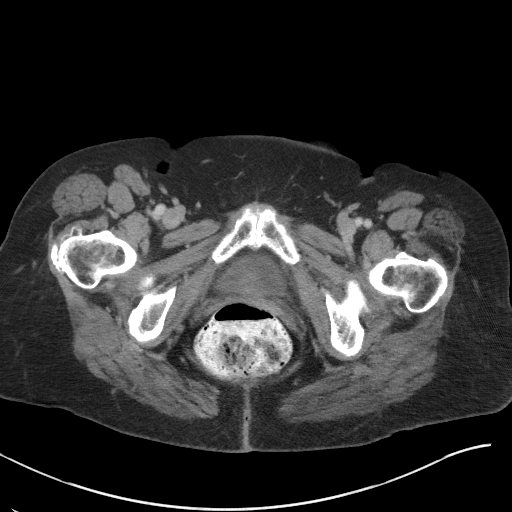
[im 23/96  soft-tissue]
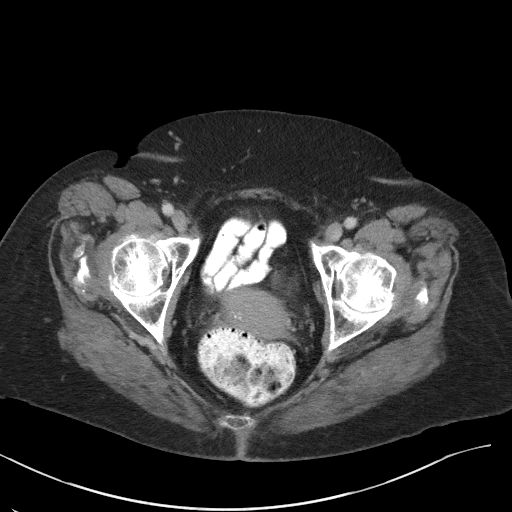
[im 34/96  soft-tissue]
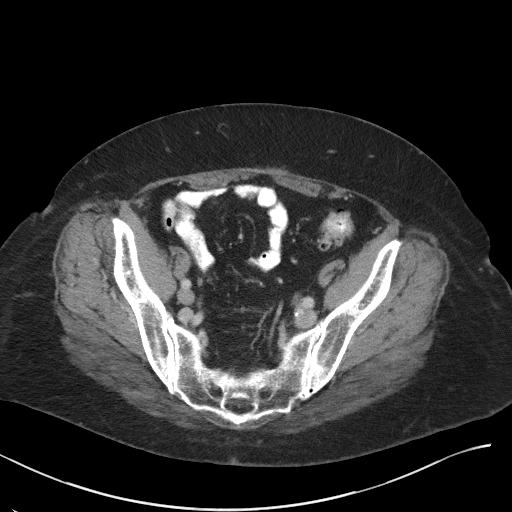
[im 40/96  soft-tissue]
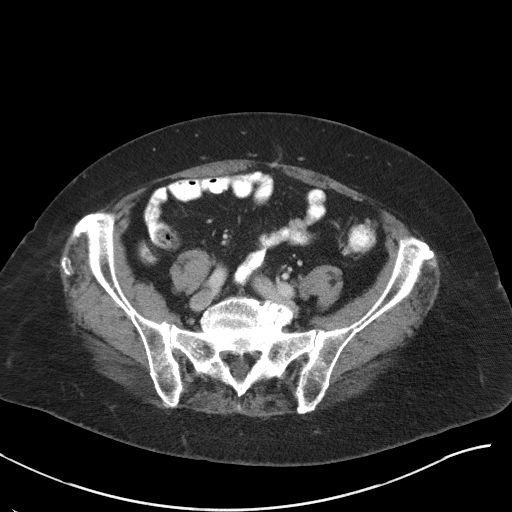
[im 51/96  soft-tissue]
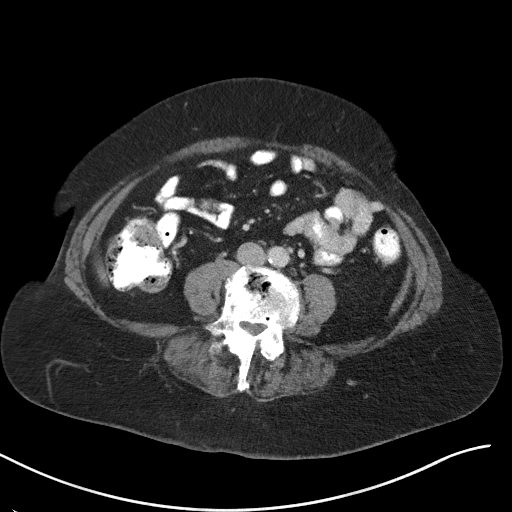
[im 56/96  soft-tissue]
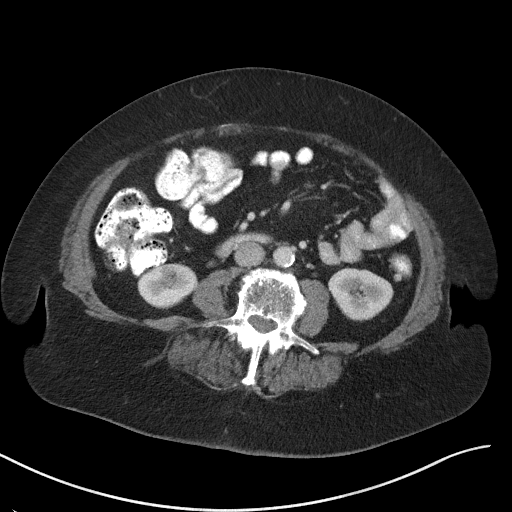
[im 62/96  soft-tissue]
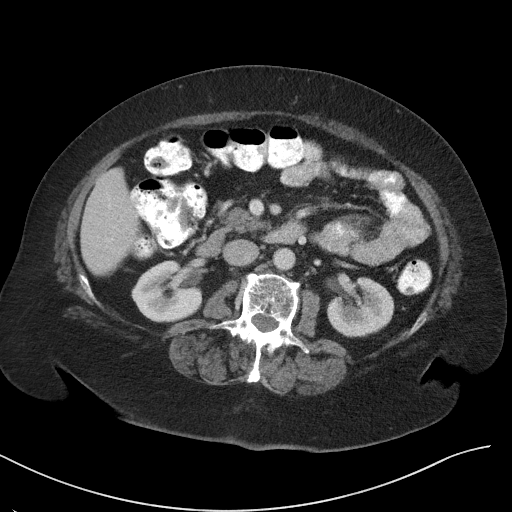
[im 73/96  soft-tissue]
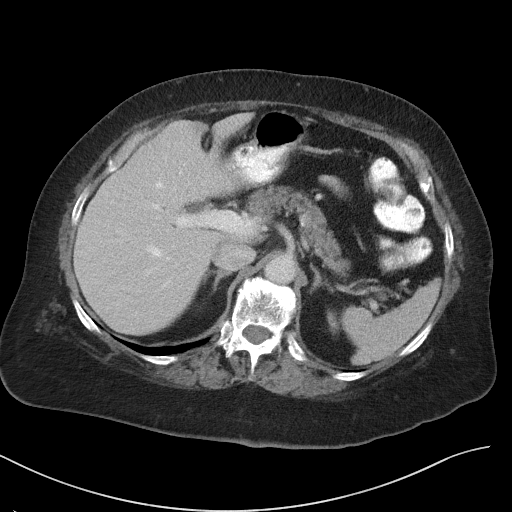
[im 73/96  bone]
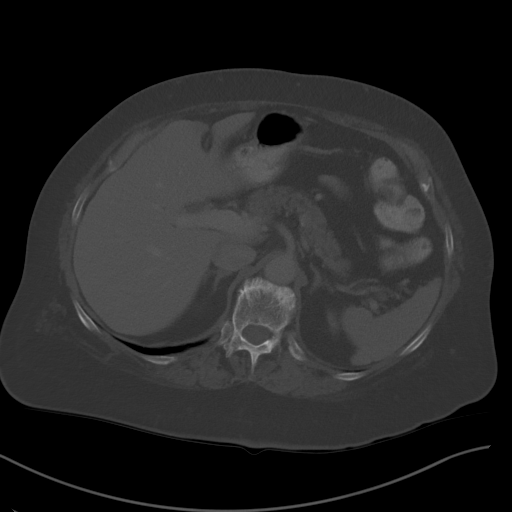
[im 79/96  soft-tissue]
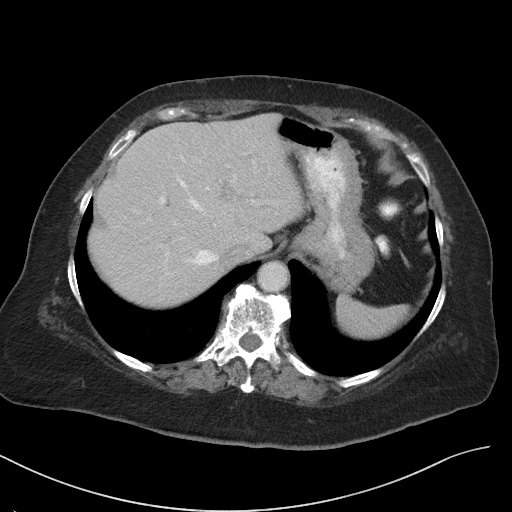
[im 90/96  soft-tissue]
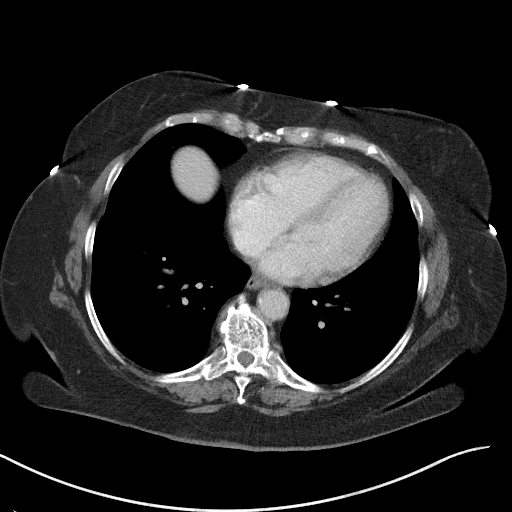

[Series 6: coronal st · coronal · 0.78mm/px · 3 of 117 slices shown]
[im 39/117  soft-tissue]
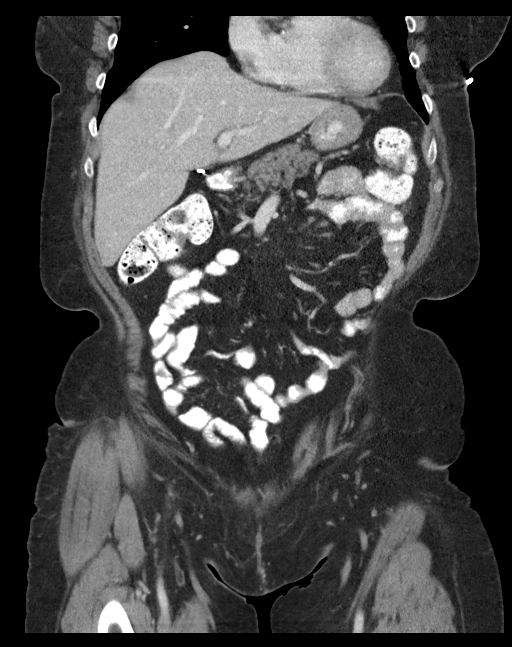
[im 52/117  soft-tissue]
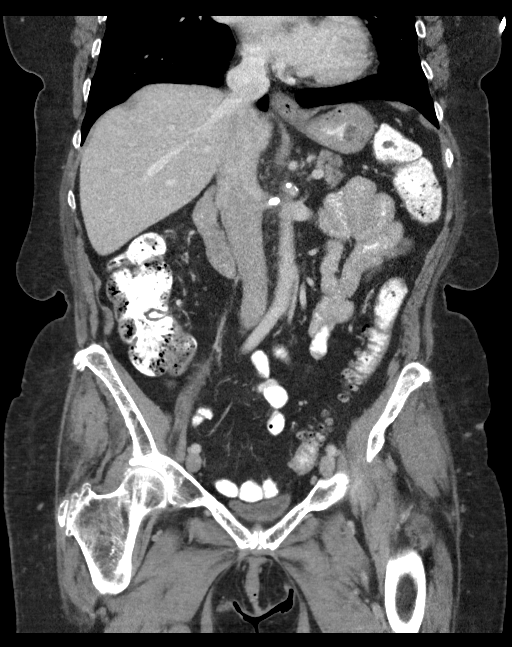
[im 65/117  soft-tissue]
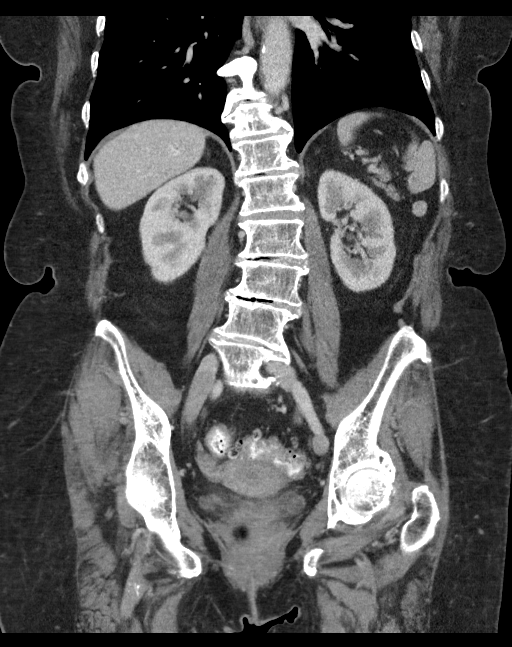

[14 of 46 positions shown; findings below may reference images not displayed]

FINDINGS: Lower chest: Limited visualization of the lower thorax is negative
for focal airspace opacity or pleural effusion.

Normal heart size. Calcifications within the aortic root. No
pericardial effusion.

Hepatobiliary: Normal hepatic contour. There is a minimal amount of
focal fatty infiltration adjacent to the fissure for the ligamentum
teres. No discrete worrisome hepatic lesions. Post cholecystectomy.
Mild dilatation of the CBD and centralized intrahepatic biliary
system, unchanged and likely the sequela of biliary reservoir
phenomena. No ascites.

Pancreas: Normal appearance of the pancreas.

Spleen: Normal appearance of the spleen. Note is made of a small
splenule.

Adrenals/Urinary Tract: There is symmetric enhancement and excretion
of the bilateral kidneys. There is an approximately 1.3 cm
hypoattenuating nonenhancing cyst arising from the anterior inferior
pole the right kidney (image 27, series 3). Additional punctate
hypoattenuating left-sided renal lesions (image 11 in 21, series 3),
are too small to adequately characterize though unchanged compared
to the [DATE] examination and favored to represent renal cysts.
There is a punctate (2 mm) nonobstructing stone within the inferior
pole of the right kidney (coronal image 61, series 6), unchanged
compared to the [DATE] examination. No evidence of left-sided
nephrolithiasis. No renal stones are seen along the expected
location of either ureter or the urinary bladder. No evidence of
urinary obstruction.

Normal appearance the bilateral adrenal glands.

Stomach/Bowel: Ingested enteric contrast extends to the level of the
rectum. No evidence of enteric obstruction. Rather extensive
diverticulosis, primarily affecting the distal descending and
sigmoid colon without evidence of superimposed acute diverticulitis.
Normal appearance of the terminal ileum. The appendix is not
visualized compatible with provided operative history. No discrete
areas of bowel wall thickening. No pneumoperitoneum, pneumatosis or
portal venous gas.

Vascular/Lymphatic: Moderate amount of mixed calcified and
noncalcified atherosclerotic plaque involving the normal caliber
abdominal aorta. Extensive calcified atherosclerotic plaque involves
the origin of the bilateral renal arteries, incompletely evaluated
on the present examination though favored to represent a
hemodynamically significant stenosis though without asymmetrically
decreased renal perfusion or asymmetric renal atrophy.

No bulky retroperitoneal, mesenteric, pelvic or inguinal
lymphadenopathy.

Reproductive: Scattered dystrophic calcifications within the uterus.
No discrete adnexal lesion. No free fluid within the pelvic
cul-de-sac.

Other: Dystrophic calcification within the right abdominal
musculature, potentially postoperative in etiology.

Musculoskeletal: No acute or aggressive osseous abnormalities.
Moderate to severe multilevel lumbar spine DDD, worse at L2-L3 and
L3-L4 with disc space height loss, endplate irregularity and
sclerosis. Mild scoliotic curvature of the thoracolumbar spine,
presumably degenerative in etiology.
IMPRESSION: 1. Rather extensive colonic diverticulosis, primarily involving the
distal descending colon as well as the sigmoid colon without
evidence of superimposed acute diverticulitis. If not recently
performed, further evaluation with colonoscopy could be performed as
indicated.
2. Otherwise, no explanation for patient's left lower quadrant
abdominal pain. Specifically, no evidence of enteric or urinary
obstruction.
3. Punctate (2 mm) nonobstructing right-sided renal stone, similar
to the [DATE] examination.
4. Suspected hemodynamically significant narrowing involving the
origin of the bilateral renal arteries, incompletely evaluated on
this non CTA examination but without associated asymmetric decreased
renal perfusion or renal atrophy.
5.  Aortic Atherosclerosis ([id number]).

## 2021-10-27 ENCOUNTER — Ambulatory Visit
Admission: EM | Admit: 2021-10-27 | Discharge: 2021-10-27 | Disposition: A | Discharge status: Home / Self Care | Payer: Medicare Other | Attending: Family Medicine | Admitting: Family Medicine

## 2021-10-27 DIAGNOSIS — J3489 Other specified disorders of nose and nasal sinuses: Secondary | ICD-10-CM

## 2021-10-27 DIAGNOSIS — J069 Acute upper respiratory infection, unspecified: Secondary | ICD-10-CM | POA: Diagnosis not present

## 2021-10-27 DIAGNOSIS — H9202 Otalgia, left ear: Secondary | ICD-10-CM

## 2021-10-27 MED ORDER — AZELASTINE HCL 0.1 % NA SOLN: 1.0000 | Freq: Two times a day (BID) | NASAL | 2 refills | Status: DC

## 2021-10-27 NOTE — Discharge Instructions (Signed)
Take Coricidin HBP to help with your congestion and sinus pressure addition to your allergy regimen and the nasal spray I have prescribed

## 2021-10-27 NOTE — ED Provider Notes (Signed)
[facility]    CSN: [id number] Arrival date & time: 10/27/21  0809      History   Chief Complaint Chief Complaint  Patient presents with   Otalgia    Ear ache and head hurting on left side    HPI [name] is a 76 y.o. female.   Patient presenting today with 3-day history of this, sinus drainage, sinus pressure and sinus headache, left ear pain.  Denies fever, chills, body aches, significant cough, chest pain, shortness of breath, abdominal pain, nausea vomiting or diarrhea.  Taking Mucinex DM with no benefit to symptoms.  Also started after symptom onset taking some leftover amoxicillin that she had at home, states she is taken 5 doses so far with no benefit.  History of seasonal allergies on Singulair and antihistamine daily.  No known sick contacts recently.   Past Medical History:  Diagnosis Date   Anxiety    Arthritis    "all over; mostly knees and shoulders"  (06/04/2015)    Family history of adverse reaction to anesthesia    Patients mother had bad N/V after anesthesia   GERD (gastroesophageal reflux disease)    High cholesterol    Hypertension    Primary localized osteoarthritis of left knee     Patient Active Problem List   Diagnosis Date Noted   LLQ abdominal pain 09/25/2020   Ingrown toenail 01/18/2020   Other chest pain 07/29/2017   Encounter for colonoscopy due to history of adenomatous colonic polyps 01/24/2016   DJD (degenerative joint disease) of knee 06/03/2015   Primary localized osteoarthritis of left knee    Hypertension    Gastroesophageal reflux disease without esophagitis 09/11/2014   High cholesterol 09/11/2014    Past Surgical History:  Procedure Laterality Date   APPENDECTOMY     CHOLECYSTECTOMY OPEN     pain   COLONOSCOPY N/A 04/22/2016   Procedure: COLONOSCOPY;  Surgeon: Rogene Houston, MD;  [facility];  Service: Endoscopy;  Laterality: N/A;  1200   COLONOSCOPY W/ POLYPECTOMY     ESOPHAGOGASTRODUODENOSCOPY  N/A 09/08/2017   Procedure: ESOPHAGOGASTRODUODENOSCOPY (EGD) WITH ESOPHAGEAL DILATION;  Surgeon: Rogene Houston, MD;  [facility];  Service: Endoscopy;  Laterality: N/A;  3:00   JOINT REPLACEMENT     KNEE ARTHROSCOPY W/ MENISCECTOMY Left    KNEE SURGERY Left 1990s   MASS EXCISION N/A 07/27/2016   Procedure: EXCISION OF SEBACEOUS CYST ON NECK;  Surgeon: Leta Baptist, MD;  Location: [place];  Service: ENT;  Laterality: N/A;   OOPHORECTOMY     for a cyst ? side   TOTAL KNEE ARTHROPLASTY Left 06/03/2015   TOTAL KNEE ARTHROPLASTY Left 06/03/2015   Procedure: TOTAL LEFT KNEE ARTHROPLASTY;  Surgeon: Elsie Saas, MD;  Location: [place];  Service: Orthopedics;  Laterality: Left;   TUBAL LIGATION      OB History   No obstetric history on file.      Home Medications    Prior to Admission medications   Medication Sig Start Date End Date Taking? Authorizing Provider  aspirin EC 81 MG tablet Take 1 tablet (81 mg total) by mouth daily. 04/23/16   Rehman, Mechele Dawley, MD  azelastine (ASTELIN) 0.1 % nasal spray Place 1 spray into both nostrils 2 (two) times daily. Use in each nostril as directed 10/27/21   Volney American, PA-C  Cholecalciferol (VITAMIN D-3) 5000 units TABS Take 5,000 Units by mouth daily.     [provider]  Dexlansoprazole (DEXILANT) 30 MG capsule Take 1 capsule (30 mg total) by mouth daily. 07/11/20   Harvel Quale, MD  diclofenac (VOLTAREN) 50 MG EC tablet Take 50 mg by mouth 2 (two) times daily as needed. 12/14/19   [provider]  gabapentin (NEURONTIN) 100 MG capsule Take 100 mg by mouth at bedtime.     [provider]  hyoscyamine (LEVSIN SL) 0.125 MG SL tablet Place 1 tablet (0.125 mg total) under the tongue every 6 (six) hours as needed (abdominal pain). Patient not taking: Reported on 09/25/2021 09/25/20   Montez Morita, Quillian Quince, MD  losartan-hydrochlorothiazide [facility]) 50-12.5 MG tablet Take 1 tablet by  mouth daily.    [provider]  montelukast (SINGULAIR) 10 MG tablet Take 10 mg by mouth at bedtime.    [provider]  Multiple Vitamins-Minerals (PRESERVISION AREDS 2) CAPS Take by mouth 2 (two) times daily.    [provider]  rosuvastatin (CRESTOR) 5 MG tablet Take 5 mg by mouth daily.    [provider]  sucralfate (CARAFATE) 1 G tablet Take 1 g by mouth daily. As needed.    [provider]  zolpidem (AMBIEN) 10 MG tablet Take 10 mg by mouth at bedtime.    [provider]    Family History Family History  Problem Relation Age of Onset   Heart disease Mother    Diabetes Mellitus II Mother    Kidney disease Mother    Cancer Mother    Hypertension Father    Cancer Father    Hypertension Brother    Hypertension Sister    Colon cancer Neg Hx     Social History Social History   Tobacco Use   Smoking status: Never   Smokeless tobacco: Never  Vaping Use   Vaping Use: Never used  Substance Use Topics   Alcohol use: No    Alcohol/week: 0.0 standard drinks   Drug use: No     Allergies   Cefzil [cefprozil]   Review of Systems Review of Systems Per HPI  Physical Exam Triage Vital Signs ED Triage Vitals  Enc Vitals Group     BP 10/27/21 0822 (!) 169/80     Pulse Rate 10/27/21 0822 84     Resp 10/27/21 0822 18     Temp 10/27/21 0822 97.7 F (36.5 C)     Temp Source 10/27/21 0822 Oral     SpO2 10/27/21 0822 98 %     Weight --      Height --      Head Circumference --      Peak Flow --      Pain Score 10/27/21 0820 7     Pain Loc --      Pain Edu? --      Excl. in [place]? --    No data found.  Updated Vital Signs BP (!) 169/80 (BP Location: Right Arm)   Pulse 84   Temp 97.7 F (36.5 C) (Oral)   Resp 18   SpO2 98%   Visual Acuity Right Eye Distance:   Left Eye Distance:   Bilateral Distance:    Right Eye Near:   Left Eye Near:    Bilateral Near:     Physical Exam Vitals and nursing note  reviewed.  Constitutional:      Appearance: Normal appearance. She is not ill-appearing.  HENT:     Head: Atraumatic.     Right Ear: External ear normal.  Left Ear: External ear normal.     Ears:     Comments: Mild bilateral middle ear effusion    Nose: Rhinorrhea present.     Mouth/Throat:     Mouth: Mucous membranes are moist.     Pharynx: Posterior oropharyngeal erythema present. No oropharyngeal exudate.  Eyes:     Extraocular Movements: Extraocular movements intact.     Conjunctiva/sclera: Conjunctivae normal.  Cardiovascular:     Rate and Rhythm: Normal rate and regular rhythm.     Heart sounds: Normal heart sounds.  Pulmonary:     Effort: Pulmonary effort is normal.     Breath sounds: Normal breath sounds. No wheezing or rales.  Musculoskeletal:        General: Normal range of motion.     Cervical back: Normal range of motion and neck supple.  Lymphadenopathy:     Cervical: No cervical adenopathy.  Skin:    General: Skin is warm and dry.  Neurological:     Mental Status: She is alert and oriented to person, place, and time.  Psychiatric:        Mood and Affect: Mood normal.        Thought Content: Thought content normal.        Judgment: Judgment normal.     UC Treatments / Results  Labs (all labs ordered are listed, but only abnormal results are displayed) Labs Reviewed - No data to display  EKG   Radiology No results found.  Procedures Procedures (including critical care time)  Medications Ordered in UC Medications - No data to display  Initial Impression / Assessment and Plan / UC Course  I have reviewed the triage vital signs and the nursing notes.  Pertinent labs & imaging results that were available during my care of the patient were reviewed by me and considered in my medical decision making (see chart for details).     Suspect new viral illness overlying seasonal allergies.  No evidence of ear infection or bacterial sinusitis today,  vital signs very reassuring apart from elevated blood pressure which is likely secondary to Mucinex DM.  Discussed Coricidin HBP for congestion and sinus pressure, start Astelin nasal spray for the same and continue good allergy regimen.  Return for worsening symptoms.  Final Clinical Impressions(s) / UC Diagnoses   Final diagnoses:  Viral URI  Sinus pressure  Left ear pain     Discharge Instructions      Take Coricidin HBP to help with your congestion and sinus pressure addition to your allergy regimen and the nasal spray I have prescribed    ED Prescriptions     Medication Sig Dispense Auth. Provider   azelastine (ASTELIN) 0.1 % nasal spray  (Status: Discontinued) Place 1 spray into both nostrils 2 (two) times daily. Use in each nostril as directed 30 mL Volney American, PA-C   azelastine (ASTELIN) 0.1 % nasal spray Place 1 spray into both nostrils 2 (two) times daily. Use in each nostril as directed 30 mL Volney American, PA-C      PDMP not reviewed this encounter.   Volney American, [place] 10/27/21 1048

## 2021-10-27 NOTE — ED Triage Notes (Signed)
Pt states that last Friday she started getting hoarse   Pt states that this morning she started having left ear pain and a headache  Pt states it is swollen behind her left ear

## 2021-10-28 ENCOUNTER — Other Ambulatory Visit (HOSPITAL_COMMUNITY)
Admission: RE | Admit: 2021-10-28 | Discharge: 2021-10-28 | Disposition: A | Discharge status: Home / Self Care | Payer: Medicare Other | Source: Ambulatory Visit | Attending: Gastroenterology | Admitting: Gastroenterology

## 2021-10-28 ENCOUNTER — Other Ambulatory Visit (HOSPITAL_COMMUNITY): Payer: Medicare Other

## 2021-10-28 ENCOUNTER — Other Ambulatory Visit (INDEPENDENT_AMBULATORY_CARE_PROVIDER_SITE_OTHER): Payer: Self-pay

## 2021-10-28 DIAGNOSIS — I10 Essential (primary) hypertension: Secondary | ICD-10-CM | POA: Insufficient documentation

## 2021-10-28 LAB — BASIC METABOLIC PANEL
Anion gap: 7 (ref 5–15)
BUN: 19 mg/dL (ref 8–23)
CO2: 27 mmol/L (ref 22–32)
Calcium: 9 mg/dL (ref 8.9–10.3)
Chloride: 104 mmol/L (ref 98–111)
Creatinine, Ser: 0.85 mg/dL (ref 0.44–1.00)
GFR, Estimated: >60 mL/min (ref >60)
Glucose, Bld: 89 mg/dL (ref 70–99)
Potassium: 3.6 mmol/L (ref 3.5–5.1)
Sodium: 138 mmol/L (ref 135–145)

## 2021-10-31 ENCOUNTER — Encounter (HOSPITAL_COMMUNITY): Payer: Self-pay | Admitting: Gastroenterology

## 2021-10-31 ENCOUNTER — Ambulatory Visit (HOSPITAL_COMMUNITY)
Admission: RE | Admit: 2021-10-31 | Discharge: 2021-10-31 | Disposition: A | Discharge status: Home / Self Care | Payer: Medicare Other | Attending: Gastroenterology | Admitting: Gastroenterology

## 2021-10-31 ENCOUNTER — Encounter (HOSPITAL_COMMUNITY)
Admission: RE | Disposition: A | Discharge status: Home / Self Care | Payer: Self-pay | Source: Home / Self Care | Attending: Gastroenterology

## 2021-10-31 ENCOUNTER — Ambulatory Visit (HOSPITAL_COMMUNITY): Payer: Medicare Other | Admitting: Anesthesiology

## 2021-10-31 ENCOUNTER — Other Ambulatory Visit: Payer: Self-pay

## 2021-10-31 ENCOUNTER — Ambulatory Visit (HOSPITAL_BASED_OUTPATIENT_CLINIC_OR_DEPARTMENT_OTHER): Payer: Medicare Other | Admitting: Anesthesiology

## 2021-10-31 DIAGNOSIS — Z86010 Personal history of colonic polyps: Secondary | ICD-10-CM

## 2021-10-31 DIAGNOSIS — K635 Polyp of colon: Secondary | ICD-10-CM

## 2021-10-31 DIAGNOSIS — Z1211 Encounter for screening for malignant neoplasm of colon: Secondary | ICD-10-CM | POA: Insufficient documentation

## 2021-10-31 DIAGNOSIS — I10 Essential (primary) hypertension: Secondary | ICD-10-CM | POA: Diagnosis not present

## 2021-10-31 DIAGNOSIS — K648 Other hemorrhoids: Secondary | ICD-10-CM | POA: Diagnosis not present

## 2021-10-31 DIAGNOSIS — E7800 Pure hypercholesterolemia, unspecified: Secondary | ICD-10-CM | POA: Diagnosis not present

## 2021-10-31 DIAGNOSIS — D124 Benign neoplasm of descending colon: Secondary | ICD-10-CM | POA: Insufficient documentation

## 2021-10-31 DIAGNOSIS — Z09 Encounter for follow-up examination after completed treatment for conditions other than malignant neoplasm: Secondary | ICD-10-CM | POA: Diagnosis not present

## 2021-10-31 DIAGNOSIS — K219 Gastro-esophageal reflux disease without esophagitis: Secondary | ICD-10-CM | POA: Diagnosis not present

## 2021-10-31 DIAGNOSIS — D123 Benign neoplasm of transverse colon: Secondary | ICD-10-CM | POA: Insufficient documentation

## 2021-10-31 DIAGNOSIS — D122 Benign neoplasm of ascending colon: Secondary | ICD-10-CM | POA: Insufficient documentation

## 2021-10-31 DIAGNOSIS — Q438 Other specified congenital malformations of intestine: Secondary | ICD-10-CM | POA: Diagnosis not present

## 2021-10-31 DIAGNOSIS — K5730 Diverticulosis of large intestine without perforation or abscess without bleeding: Secondary | ICD-10-CM | POA: Diagnosis not present

## 2021-10-31 HISTORY — PX: COLONOSCOPY WITH PROPOFOL: SHX5780

## 2021-10-31 HISTORY — PX: POLYPECTOMY: SHX5525

## 2021-10-31 LAB — COLOGUARD: Cologuard: POSITIVE — AB

## 2021-10-31 SURGERY — COLONOSCOPY WITH PROPOFOL
Anesthesia: General

## 2021-10-31 MED ORDER — LACTATED RINGERS IV SOLN: INTRAVENOUS | Status: DC

## 2021-10-31 MED ORDER — LACTATED RINGERS IV SOLN: INTRAVENOUS | Status: DC | PRN

## 2021-10-31 MED ORDER — PROPOFOL 500 MG/50ML IV EMUL
INTRAVENOUS | Status: AC
  Filled 2021-10-31: qty 50

## 2021-10-31 MED ORDER — PROPOFOL 10 MG/ML IV BOLUS
INTRAVENOUS | Status: DC | PRN
  Administered 2021-10-31: 60 mg via INTRAVENOUS

## 2021-10-31 MED ORDER — PROPOFOL 500 MG/50ML IV EMUL
INTRAVENOUS | Status: DC | PRN
  Administered 2021-10-31: 150 ug/kg/min via INTRAVENOUS

## 2021-10-31 NOTE — Transfer of Care (Signed)
Immediate Anesthesia Transfer of Care Note  Patient: [name]  Procedure(s) Performed: COLONOSCOPY WITH PROPOFOL POLYPECTOMY  Patient Location: PACU  Anesthesia Type:General  Level of Consciousness: awake, alert  and oriented  Airway & Oxygen Therapy: Patient Spontanous Breathing  Post-op Assessment: Report given to RN, Post -op Vital signs reviewed and stable, Patient moving all extremities X 4 and Patient able to stick tongue midline  Post vital signs: Reviewed  Last Vitals:  Vitals Value Taken Time  BP 114/60   Temp 97.6   Pulse 75   Resp 16   SpO2 97     Last Pain:  Vitals:   10/31/21 0913  TempSrc:   PainSc: 0-No pain      Patients Stated Pain Goal: 8 (09/31/12 1624)  Complications: No notable events documented.

## 2021-10-31 NOTE — Op Note (Signed)
[facility] Patient Name: [name] Procedure Date: 10/31/2021 9:08 AM MRN: [id number] Date of Birth: 15-Aug-1945 Attending MD: [name] ,  CSN: [id number] Age: 76 Admit Type: Outpatient Procedure:                Colonoscopy Indications:              Surveillance: Personal history of adenomatous                            polyps on last colonoscopy > 5 years ago Providers:                [name], [place] Theda Sers RN, RN, Raphael Gibney, Technician Referring MD:              Medicines:                Monitored Anesthesia Care Complications:            No immediate complications. Estimated Blood Loss:     Estimated blood loss: none. Procedure:                Pre-Anesthesia Assessment:                           - Prior to the procedure, a History and Physical                            was performed, and patient medications, allergies                            and sensitivities were reviewed. The patient's                            tolerance of previous anesthesia was reviewed.                           - The risks and benefits of the procedure and the                            sedation options and risks were discussed with the                            patient. All questions were answered and informed                            consent was obtained.                           - ASA Grade Assessment: II - A patient with mild                            systemic disease.                           After obtaining informed consent, the colonoscope  was passed under direct vision. Throughout the                            procedure, the patient's blood pressure, pulse, and                            oxygen saturations were monitored continuously. The                            [id number]-HQ190L ([id number]) scope was introduced through                            the anus and advanced to the the cecum, identified                             by appendiceal orifice and ileocecal valve. The                            patient tolerated the procedure well. The                            colonoscopy was technically difficult and complex                            due to a tortuous colon. The quality of the bowel                            preparation was adequate to identify polyps 6 mm                            and larger in size. Scope In: 9:15:20 AM Scope Out: 10:15:39 AM Scope Withdrawal Time: 0 hours 48 minutes 21 seconds  Total Procedure Duration: 1 hour 0 minutes 19 seconds  Findings:      The perianal and digital rectal examinations were normal.      Fourteen sessile polyps were found in the ascending colon and cecum. The       polyps were 3 to 8 mm in size. These polyps were removed with a cold       snare. Resection and retrieval were complete.      Two sessile polyps were found in the transverse colon and ascending       colon. The polyps were 2 mm in size. These polyps were removed with a       cold snare. Resection and retrieval were complete.      A 5 mm polyp was found in the descending colon. The polyp was sessile.       The polyp was removed with a cold snare. Resection and retrieval were       complete.      The sigmoid colon was significantly tortuous. Scope could be advanced by       [place].      Multiple small and large-mouthed diverticula were found in the sigmoid       colon and descending colon.      Non-bleeding internal hemorrhoids were found during retroflexion. The  hemorrhoids were small. Impression:               - Fourteen 3 to 8 mm polyps in the ascending colon                            and in the cecum, removed with a cold snare.                            Resected and retrieved.                           - Two 2 mm polyps in the transverse colon and in                            the ascending colon, removed with a cold snare.                             Resected and retrieved.                           - One 5 mm polyp in the descending colon, removed                            with a cold snare. Resected and retrieved.                           - Tortuous colon.                           - Diverticulosis in the sigmoid colon and in the                            descending colon.                           - Non-bleeding internal hemorrhoids. Moderate Sedation:      Per Anesthesia Care Recommendation:           - Discharge patient to home (ambulatory).                           - Resume previous diet.                           - Repeat colonoscopy in 2 years for surveillance.                           - Await pathology results. Procedure Code(s):        --- Professional ---                           [phone], Colonoscopy, flexible; with removal of                            tumor(s), polyp(s), or other lesion(s) by snare  technique Diagnosis Code(s):        --- Professional ---                           K63.5, Polyp of colon                           Z86.010, Personal history of colonic polyps                           K64.8, Other hemorrhoids                           K57.30, Diverticulosis of large intestine without                            perforation or abscess without bleeding                           Q43.8, Other specified congenital malformations of                            intestine CPT copyright 2019 American Medical Association. All rights reserved. The codes documented in this report are preliminary and upon coder review may  be revised to meet current compliance requirements. [name], MD [name],  10/31/2021 10:23:03 AM This report has been signed electronically. Number of Addenda: 0

## 2021-10-31 NOTE — Anesthesia Preprocedure Evaluation (Signed)
Anesthesia Evaluation  Patient identified by MRN, date of birth, ID band Patient awake    Reviewed: Allergy & Precautions, H&P , NPO status , Patient's Chart, lab work & pertinent test results, reviewed documented beta blocker date and time   Airway Mallampati: II  TM Distance: >3 FB Neck ROM: full    Dental no notable dental hx.    Pulmonary neg pulmonary ROS,    Pulmonary exam normal breath sounds clear to auscultation       Cardiovascular Exercise Tolerance: Good hypertension, negative cardio ROS   Rhythm:regular Rate:Normal     Neuro/Psych PSYCHIATRIC DISORDERS Anxiety negative neurological ROS     GI/Hepatic Neg liver ROS, GERD  Medicated,  Endo/Other  negative endocrine ROS  Renal/GU negative Renal ROS  negative genitourinary   Musculoskeletal   Abdominal   Peds  Hematology negative hematology ROS (+)   Anesthesia Other Findings   Reproductive/Obstetrics negative OB ROS                             Anesthesia Physical Anesthesia Plan  ASA: 2  Anesthesia Plan: General   Post-op Pain Management:    Induction:   PONV Risk Score and Plan: Propofol infusion  Airway Management Planned:   Additional Equipment:   Intra-op Plan:   Post-operative Plan:   Informed Consent: I have reviewed the patients History and Physical, chart, labs and discussed the procedure including the risks, benefits and alternatives for the proposed anesthesia with the patient or authorized representative who has indicated his/her understanding and acceptance.     Dental Advisory Given  Plan Discussed with: CRNA  Anesthesia Plan Comments:         Anesthesia Quick Evaluation  

## 2021-10-31 NOTE — H&P (Signed)
[name] is an 76 y.o. female.   Chief Complaint: history of colon polyps HPI: 76 y/o F with PMH GERD, HLD, HTN and anxiety, who comes for follow-up of colon polyps.  Last colonoscopy was performed in 2017, had resection of 2 tubular adenomas from the transverse and ascending colon, was recommended to have repeat colonoscopy in 5 years. The patient denies having any complaints such as melena, hematochezia, abdominal pain or distention, change in her bowel movement consistency or frequency, no changes in her weight recently.  No family history of colorectal cancer.   Past Medical History:  Diagnosis Date   Anxiety    Arthritis    "all over; mostly knees and shoulders"  (06/04/2015)    Family history of adverse reaction to anesthesia    Patients mother had bad N/V after anesthesia   GERD (gastroesophageal reflux disease)    High cholesterol    Hypertension    Primary localized osteoarthritis of left knee     Past Surgical History:  Procedure Laterality Date   APPENDECTOMY     CHOLECYSTECTOMY OPEN     pain   COLONOSCOPY N/A 04/22/2016   Procedure: COLONOSCOPY;  Surgeon: Rogene Houston, MD;  [facility];  Service: Endoscopy;  Laterality: N/A;  1200   COLONOSCOPY W/ POLYPECTOMY     ESOPHAGOGASTRODUODENOSCOPY N/A 09/08/2017   Procedure: ESOPHAGOGASTRODUODENOSCOPY (EGD) WITH ESOPHAGEAL DILATION;  Surgeon: Rogene Houston, MD;  [facility];  Service: Endoscopy;  Laterality: N/A;  3:00   JOINT REPLACEMENT     KNEE ARTHROSCOPY W/ MENISCECTOMY Left    KNEE SURGERY Left 1990s   MASS EXCISION N/A 07/27/2016   Procedure: EXCISION OF SEBACEOUS CYST ON NECK;  Surgeon: Leta Baptist, MD;  Location: [place];  Service: ENT;  Laterality: N/A;   OOPHORECTOMY     for a cyst ? side   TOTAL KNEE ARTHROPLASTY Left 06/03/2015   TOTAL KNEE ARTHROPLASTY Left 06/03/2015   Procedure: TOTAL LEFT KNEE ARTHROPLASTY;  Surgeon: Elsie Saas, MD;  Location: [place];  Service:  Orthopedics;  Laterality: Left;   TUBAL LIGATION      Family History  Problem Relation Age of Onset   Heart disease Mother    Diabetes Mellitus II Mother    Kidney disease Mother    Cancer Mother    Hypertension Father    Cancer Father    Hypertension Brother    Hypertension Sister    Colon cancer Neg Hx    Social History:  reports that she has never smoked. She has never used smokeless tobacco. She reports that she does not drink alcohol and does not use drugs.  Allergies:  Allergies  Allergen Reactions   Cefzil [Cefprozil] Rash    Medications Prior to Admission  Medication Sig Dispense Refill   aspirin EC 81 MG tablet Take 81 mg by mouth every other day. In the evening.     azelastine (ASTELIN) 0.1 % nasal spray Place 1 spray into both nostrils 2 (two) times daily. Use in each nostril as directed (Patient taking differently: Place 2 sprays into both nostrils 2 (two) times daily as needed for allergies. Use in each nostril as directed) 30 mL 2   Cholecalciferol (VITAMIN D-3) 5000 units TABS Take 5,000 Units by mouth in the morning.     Dexlansoprazole (DEXILANT) 30 MG capsule Take 1 capsule (30 mg total) by mouth daily. 90 capsule 3   gabapentin (NEURONTIN) 100 MG capsule Take 100 mg by mouth  at bedtime.      hydrochlorothiazide (MICROZIDE) 12.5 MG capsule Take 12.5 mg by mouth in the morning.     losartan (COZAAR) 50 MG tablet Take 50 mg by mouth in the morning.     montelukast (SINGULAIR) 10 MG tablet Take 10 mg by mouth at bedtime.     Multiple Vitamins-Minerals (PRESERVISION AREDS 2) CAPS Take 1 capsule by mouth 2 (two) times daily.     rosuvastatin (CRESTOR) 5 MG tablet Take 5 mg by mouth every evening.     sucralfate (CARAFATE) 1 G tablet Take 1 g by mouth in the morning.     zolpidem (AMBIEN) 10 MG tablet Take 10 mg by mouth at bedtime.      No results found for this or any previous visit (from the past 48 hour(s)). No results found.  Review of Systems   Constitutional: Negative.   HENT: Negative.    Eyes: Negative.   Respiratory: Negative.    Cardiovascular: Negative.   Gastrointestinal: Negative.   Endocrine: Negative.   Genitourinary: Negative.   Musculoskeletal: Negative.   Skin: Negative.   Allergic/Immunologic: Negative.   Neurological: Negative.   Hematological: Negative.   Psychiatric/Behavioral: Negative.     Blood pressure (!) 161/73, pulse 90, temperature 98.5 F (36.9 C), temperature source Oral, resp. rate 16, height '5\' 5"'$  (1.651 m), weight 81.6 kg, SpO2 99 %. Physical Exam  GENERAL: The patient is AO x3, in no acute distress. HEENT: Head is normocephalic and atraumatic. EOMI are intact. Mouth is well hydrated and without lesions. NECK: Supple. No masses LUNGS: Clear to auscultation. No presence of rhonchi/wheezing/rales. Adequate chest expansion HEART: RRR, normal s1 and s2. ABDOMEN: Soft, nontender, no guarding, no peritoneal signs, and nondistended. BS +. No masses. EXTREMITIES: Without any cyanosis, clubbing, rash, lesions or edema. NEUROLOGIC: AOx3, no focal motor deficit. SKIN: no jaundice, no rashes  Assessment/Plan 76 y/o F with PMH GERD, HLD, HTN and anxiety, who comes for follow-up of colon polyps.  We will proceed with colonoscopy. Harvel Quale, MD 10/31/2021, 8:15 AM

## 2021-10-31 NOTE — Discharge Instructions (Signed)
You are being discharged to home.  Resume your previous diet.  Your physician has recommended a repeat colonoscopy in two years for surveillance.  We are waiting for your pathology results.

## 2021-10-31 NOTE — Anesthesia Postprocedure Evaluation (Signed)
Anesthesia Post Note  Patient: [name]  Procedure(s) Performed: COLONOSCOPY WITH PROPOFOL POLYPECTOMY  Patient location during evaluation: Phase II Anesthesia Type: General Level of consciousness: awake Pain management: pain level controlled Vital Signs Assessment: post-procedure vital signs reviewed and stable Respiratory status: spontaneous breathing and respiratory function stable Cardiovascular status: blood pressure returned to baseline and stable Postop Assessment: no headache and no apparent nausea or vomiting Anesthetic complications: no Comments: Late entry   No notable events documented.   Last Vitals:  Vitals:   10/31/21 0807 10/31/21 0913  BP: (!) 161/73 114/60  Pulse: 90 77  Resp: 16 15  Temp: 36.9 C 36.5 C  SpO2: 99% 98%    Last Pain:  Vitals:   10/31/21 0913  TempSrc: Oral  PainSc: 0-No pain                 Louann Sjogren

## 2021-11-03 ENCOUNTER — Encounter (INDEPENDENT_AMBULATORY_CARE_PROVIDER_SITE_OTHER): Payer: Self-pay | Admitting: *Deleted

## 2021-11-03 LAB — SURGICAL PATHOLOGY

## 2021-11-10 ENCOUNTER — Encounter (HOSPITAL_COMMUNITY): Payer: Self-pay | Admitting: Gastroenterology

## 2021-12-10 DIAGNOSIS — H9202 Otalgia, left ear: Secondary | ICD-10-CM | POA: Diagnosis not present

## 2021-12-10 DIAGNOSIS — J302 Other seasonal allergic rhinitis: Secondary | ICD-10-CM | POA: Diagnosis not present

## 2021-12-10 DIAGNOSIS — E782 Mixed hyperlipidemia: Secondary | ICD-10-CM | POA: Diagnosis not present

## 2021-12-10 DIAGNOSIS — M792 Neuralgia and neuritis, unspecified: Secondary | ICD-10-CM | POA: Diagnosis not present

## 2021-12-10 DIAGNOSIS — I10 Essential (primary) hypertension: Secondary | ICD-10-CM | POA: Diagnosis not present

## 2021-12-10 DIAGNOSIS — K219 Gastro-esophageal reflux disease without esophagitis: Secondary | ICD-10-CM | POA: Diagnosis not present

## 2021-12-25 DIAGNOSIS — R7301 Impaired fasting glucose: Secondary | ICD-10-CM | POA: Diagnosis not present

## 2021-12-25 DIAGNOSIS — I10 Essential (primary) hypertension: Secondary | ICD-10-CM | POA: Diagnosis not present

## 2021-12-31 DIAGNOSIS — E782 Mixed hyperlipidemia: Secondary | ICD-10-CM | POA: Diagnosis not present

## 2021-12-31 DIAGNOSIS — M792 Neuralgia and neuritis, unspecified: Secondary | ICD-10-CM | POA: Diagnosis not present

## 2021-12-31 DIAGNOSIS — J302 Other seasonal allergic rhinitis: Secondary | ICD-10-CM | POA: Diagnosis not present

## 2021-12-31 DIAGNOSIS — K219 Gastro-esophageal reflux disease without esophagitis: Secondary | ICD-10-CM | POA: Diagnosis not present

## 2021-12-31 DIAGNOSIS — H9202 Otalgia, left ear: Secondary | ICD-10-CM | POA: Diagnosis not present

## 2021-12-31 DIAGNOSIS — I10 Essential (primary) hypertension: Secondary | ICD-10-CM | POA: Diagnosis not present

## 2022-02-17 DIAGNOSIS — I10 Essential (primary) hypertension: Secondary | ICD-10-CM | POA: Diagnosis not present

## 2022-02-17 DIAGNOSIS — K5792 Diverticulitis of intestine, part unspecified, without perforation or abscess without bleeding: Secondary | ICD-10-CM | POA: Diagnosis not present

## 2022-02-23 DIAGNOSIS — R140 Abdominal distension (gaseous): Secondary | ICD-10-CM | POA: Diagnosis not present

## 2022-02-23 DIAGNOSIS — R350 Frequency of micturition: Secondary | ICD-10-CM | POA: Diagnosis not present

## 2022-02-23 DIAGNOSIS — K5900 Constipation, unspecified: Secondary | ICD-10-CM | POA: Diagnosis not present

## 2022-02-27 ENCOUNTER — Emergency Department (HOSPITAL_COMMUNITY)
Admission: EM | Admit: 2022-02-27 | Discharge: 2022-02-27 | Disposition: A | Discharge status: Home / Self Care | Payer: Medicare Other | Attending: Student | Admitting: Student

## 2022-02-27 ENCOUNTER — Other Ambulatory Visit: Payer: Self-pay

## 2022-02-27 ENCOUNTER — Emergency Department (HOSPITAL_COMMUNITY): Payer: Medicare Other

## 2022-02-27 ENCOUNTER — Encounter (HOSPITAL_COMMUNITY): Payer: Self-pay | Admitting: Emergency Medicine

## 2022-02-27 DIAGNOSIS — I10 Essential (primary) hypertension: Secondary | ICD-10-CM | POA: Diagnosis not present

## 2022-02-27 DIAGNOSIS — R1032 Left lower quadrant pain: Secondary | ICD-10-CM | POA: Diagnosis not present

## 2022-02-27 DIAGNOSIS — R110 Nausea: Secondary | ICD-10-CM | POA: Insufficient documentation

## 2022-02-27 DIAGNOSIS — R109 Unspecified abdominal pain: Secondary | ICD-10-CM

## 2022-02-27 DIAGNOSIS — Z7982 Long term (current) use of aspirin: Secondary | ICD-10-CM | POA: Diagnosis not present

## 2022-02-27 DIAGNOSIS — Z79899 Other long term (current) drug therapy: Secondary | ICD-10-CM | POA: Insufficient documentation

## 2022-02-27 LAB — HEPATIC FUNCTION PANEL
ALT: 21 U/L (ref 0–44)
AST: 24 U/L (ref 15–41)
Albumin: 4.1 g/dL (ref 3.5–5.0)
Alkaline Phosphatase: 64 U/L (ref 38–126)
Bilirubin, Direct: 0.2 mg/dL (ref 0.0–0.2)
Indirect Bilirubin: 0.5 mg/dL (ref 0.3–0.9)
Total Bilirubin: 0.7 mg/dL (ref 0.3–1.2)
Total Protein: 7.5 g/dL (ref 6.5–8.1)

## 2022-02-27 LAB — CBC
HCT: 42.4 % (ref 36.0–46.0)
Hemoglobin: 14.3 g/dL (ref 12.0–15.0)
MCH: 29.5 pg (ref 26.0–34.0)
MCHC: 33.7 g/dL (ref 30.0–36.0)
MCV: 87.6 fL (ref 80.0–100.0)
Platelets: 260 10*3/uL (ref 150–400)
RBC: 4.84 MIL/uL (ref 3.87–5.11)
RDW: 12.6 % (ref 11.5–15.5)
WBC: 8.2 10*3/uL (ref 4.0–10.5)
nRBC: 0 % (ref 0.0–0.2)

## 2022-02-27 LAB — URINALYSIS, ROUTINE W REFLEX MICROSCOPIC
Bilirubin Urine: NEGATIVE
Glucose, UA: NEGATIVE mg/dL
Hgb urine dipstick: NEGATIVE
Ketones, ur: NEGATIVE mg/dL
Leukocytes,Ua: NEGATIVE
Nitrite: NEGATIVE
Protein, ur: NEGATIVE mg/dL
Specific Gravity, Urine: 1.009 (ref 1.005–1.030)
pH: 7 (ref 5.0–8.0)

## 2022-02-27 LAB — LIPASE, BLOOD: Lipase: 37 U/L (ref 11–51)

## 2022-02-27 LAB — BASIC METABOLIC PANEL
Anion gap: 10 (ref 5–15)
BUN: 14 mg/dL (ref 8–23)
CO2: 25 mmol/L (ref 22–32)
Calcium: 9.1 mg/dL (ref 8.9–10.3)
Chloride: 102 mmol/L (ref 98–111)
Creatinine, Ser: 0.92 mg/dL (ref 0.44–1.00)
GFR, Estimated: >60 mL/min (ref >60)
Glucose, Bld: 96 mg/dL (ref 70–99)
Potassium: 3.8 mmol/L (ref 3.5–5.1)
Sodium: 137 mmol/L (ref 135–145)

## 2022-02-27 MED ORDER — MORPHINE SULFATE (PF) 4 MG/ML IV SOLN
4.0000 mg | Freq: Once | INTRAVENOUS | Status: AC
  Administered 2022-02-27: 4 mg via INTRAVENOUS
  Filled 2022-02-27: qty 1

## 2022-02-27 MED ORDER — IOHEXOL 300 MG/ML  SOLN
100.0000 mL | Freq: Once | INTRAMUSCULAR | Status: AC | PRN
  Administered 2022-02-27: 100 mL via INTRAVENOUS

## 2022-02-27 MED ORDER — CEPHALEXIN 500 MG PO CAPS: 500.0000 mg | ORAL_CAPSULE | Freq: Four times a day (QID) | ORAL | 0 refills | Status: DC

## 2022-02-27 MED ORDER — ONDANSETRON HCL 4 MG PO TABS: 4.0000 mg | ORAL_TABLET | Freq: Four times a day (QID) | ORAL | 0 refills | Status: DC

## 2022-02-27 MED ORDER — DICYCLOMINE HCL 20 MG PO TABS: 20.0000 mg | ORAL_TABLET | Freq: Two times a day (BID) | ORAL | 0 refills | Status: DC

## 2022-02-27 MED ORDER — DICYCLOMINE HCL 10 MG PO CAPS
10.0000 mg | ORAL_CAPSULE | Freq: Once | ORAL | Status: AC
  Administered 2022-02-27: 10 mg via ORAL
  Filled 2022-02-27: qty 1

## 2022-02-27 MED ORDER — ONDANSETRON HCL 4 MG/2ML IJ SOLN
4.0000 mg | Freq: Once | INTRAMUSCULAR | Status: AC
  Administered 2022-02-27: 4 mg via INTRAVENOUS
  Filled 2022-02-27: qty 2

## 2022-02-27 NOTE — ED Triage Notes (Signed)
Pt c/o left sided back, flank and abd pain/nausea/chills with urinary frequency intermittent x 2 weeks, denies fevers

## 2022-02-27 NOTE — Discharge Instructions (Addendum)
Note your work-up today was overall reassuring.  Your laboratory studies were all within normal range.  There was no acute process identified on the CT study of your abdomen.  As we discussed, concern for possible beginning of shingles or zoster.  Will account for rash on affected area where you are experiencing pain.  Take Tylenol/ibuprofen as needed for pain as well as Zofran which provided as needed for nausea.  Given that you are symptomatic for concerning UTI, will treat with antibiotics for the next 5 days.  Recommend close follow-up with PCP in 3 to 5 days for reevaluation of your symptoms.  Please do not hesitate to return to the emergency department if the worrisome signs and symptoms we discussed become apparent.

## 2022-02-27 NOTE — ED Provider Notes (Cosign Needed Addendum)
[facility] EMERGENCY DEPARTMENT Provider Note   CSN: [id number] Arrival date & time: 02/27/22  1148     History  Chief Complaint  Patient presents with   Flank Pain    [name] is a 76 y.o. female.   Flank Pain   75 year old female presents emergency department with complaints of left-sided flank pain, left lower quadrant pain.  Patient states that symptoms began around 8-8 30 last night when she was sitting at home watching TV.  She states that symptoms have been persistent since fluctuating in intensity.  Pain is described as dull and intermittently sharp without radiation.  Notes associated nausea but no emesis.  Patient secondarily endorses bloating type feeling over the past 2 to 3 weeks although she has had regular bowel movements.  States last bowel movement was this morning and regular as per patient.  Denies hematochezia/melena.  Denies history of similar symptoms in the past.  She notes some feelings of dysuria and urinary frequency over the past 2 to 3 days.  Reports prior abdominal surgeries of cholecystectomy.  Denies fever, chills, night sweats, chest pain, shortness of breath, vaginal symptoms, known trauma or injury to affected side, rash.  Past medical history significant for hypertension, GERD, arthritis, anxiety, diverticulosis  Home Medications Prior to Admission medications   Medication Sig Start Date End Date Taking? Authorizing Provider  aspirin EC 81 MG tablet Take 81 mg by mouth every other day. In the evening. 04/23/16  Yes Rehman, Mechele Dawley, MD  cephALEXin (KEFLEX) 500 MG capsule Take 1 capsule (500 mg total) by mouth 4 (four) times daily. 02/27/22  Yes Dion Saucier A, PA  Cholecalciferol (VITAMIN D-3) 5000 units TABS Take 5,000 Units by mouth in the morning.   Yes [provider]  Dexlansoprazole (DEXILANT) 30 MG capsule Take 1 capsule (30 mg total) by mouth daily. 07/11/20  Yes Harvel Quale, MD  gabapentin (NEURONTIN) 300 MG capsule  Take 300 mg by mouth at bedtime. 12/10/21  Yes [provider]  hydrochlorothiazide (MICROZIDE) 12.5 MG capsule Take 12.5 mg by mouth in the morning.   Yes [provider]  losartan (COZAAR) 50 MG tablet Take 50 mg by mouth 2 (two) times daily.   Yes [provider]  montelukast (SINGULAIR) 10 MG tablet Take 10 mg by mouth at bedtime.   Yes [provider]  Multiple Vitamins-Minerals (PRESERVISION AREDS 2) CAPS Take 1 capsule by mouth 2 (two) times daily.   Yes [provider]  ondansetron (ZOFRAN) 4 MG tablet Take 1 tablet (4 mg total) by mouth every 6 (six) hours. 02/27/22  Yes Dion Saucier A, PA  rosuvastatin (CRESTOR) 5 MG tablet Take 5 mg by mouth every evening.   Yes [provider]  sucralfate (CARAFATE) 1 G tablet Take 1 g by mouth in the morning.   Yes [provider]  zolpidem (AMBIEN) 10 MG tablet Take 10 mg by mouth at bedtime.   Yes [provider]  gabapentin (NEURONTIN) 100 MG capsule Take 100 mg by mouth at bedtime.     [provider]      Allergies    Cefzil [cefprozil]    Review of Systems   Review of Systems  Genitourinary:  Positive for flank pain.  All other systems reviewed and are negative.   Physical Exam Updated Vital Signs BP (!) 125/58   Pulse 72   Temp 98.2 F (36.8 C) (Oral)   Resp 16   Ht '5\' 5"'$  (1.651 m)  Wt 81.6 kg   SpO2 95%   BMI 29.95 kg/m  Physical Exam Vitals and nursing note reviewed.  Constitutional:      General: She is not in acute distress.    Appearance: Normal appearance. She is well-developed.  HENT:     Head: Normocephalic and atraumatic.  Eyes:     Conjunctiva/sclera: Conjunctivae normal.  Cardiovascular:     Rate and Rhythm: Normal rate and regular rhythm.     Pulses: Normal pulses.  Pulmonary:     Effort: Pulmonary effort is normal. No respiratory distress.     Breath sounds: Normal breath sounds.  Abdominal:     Palpations: Abdomen is  soft.     Tenderness: There is abdominal tenderness in the suprapubic area and left lower quadrant. There is no right CVA tenderness or left CVA tenderness.     Comments: Left lower quadrant tenderness as well as mild suprapubic tenderness.  No overlying skin abnormalities noted on patient's abdomen, flank, back.  Mild tenderness laterally at level of umbilicus on the left side.  Musculoskeletal:        General: No swelling.     Cervical back: Neck supple.  Skin:    General: Skin is warm and dry.     Capillary Refill: Capillary refill takes less than 2 seconds.  Neurological:     Mental Status: She is alert.  Psychiatric:        Mood and Affect: Mood normal.     ED Results / Procedures / Treatments   Labs (all labs ordered are listed, but only abnormal results are displayed) Labs Reviewed  URINE CULTURE  URINALYSIS, ROUTINE W REFLEX MICROSCOPIC  BASIC METABOLIC PANEL  CBC  LIPASE, BLOOD  HEPATIC FUNCTION PANEL    EKG None  Radiology CT Abdomen Pelvis W Contrast  Result Date: 02/27/2022 CLINICAL DATA:  LEFT lower quadrant LEFT side pain for 2 weeks, history of diverticulitis EXAM: CT ABDOMEN AND PELVIS WITH CONTRAST TECHNIQUE: Multidetector CT imaging of the abdomen and pelvis was performed using the standard protocol following bolus administration of intravenous contrast. RADIATION DOSE REDUCTION: This exam was performed according to the departmental dose-optimization program which includes automated exposure control, adjustment of the mA and/or kV according to patient size and/or use of iterative reconstruction technique. CONTRAST:  121m OMNIPAQUE IOHEXOL 300 MG/ML SOLN IV. No oral contrast. COMPARISON:  10/22/2020 FINDINGS: Lower chest: Lung bases clear Hepatobiliary: Gallbladder surgically absent. Mild intrahepatic and extrahepatic biliary dilatation increased since 2022 recommend correlation with LFTs. Liver otherwise unremarkable. Pancreas: Normal appearance Spleen: Normal  appearance.  Small splenules inferior to spleen. Adrenals/Urinary Tract: Small BILATERAL renal cysts, simple appearance, largest 18 mm diameter inferior pole RIGHT kidney; no follow-up imaging recommended. Adrenal glands, kidneys, ureters, and bladder otherwise normal appearance Stomach/Bowel: Appendix not visualized. Diverticulosis of descending and sigmoid colon without CT evidence of diverticulitis. Stomach and bowel loops otherwise normal appearance Vascular/Lymphatic: Atherosclerotic calcifications aorta without aneurysm. Vascular structures patent. No adenopathy. Reproductive: Uterus and adnexa unremarkable Other: No free air or free fluid. No hernia or inflammatory process. Musculoskeletal: Diffuse osseous demineralization. Degenerative changes thoracolumbar spine. IMPRESSION: Distal colonic diverticulosis without evidence of diverticulitis. Mild intrahepatic and extrahepatic biliary dilatation post post cholecystectomy increased since 2022 recommend correlation with LFTs. No acute intra-abdominal or intrapelvic abnormalities. Electronically Signed   By: MLavonia DanaM.D.   On: 02/27/2022 14:54    Procedures Procedures    Medications Ordered in ED Medications  dicyclomine (BENTYL) capsule 10 mg (has no administration in  time range)  ondansetron (ZOFRAN) injection 4 mg (4 mg Intravenous Given 02/27/22 1344)  morphine (PF) 4 MG/ML injection 4 mg (4 mg Intravenous Given 02/27/22 1346)  iohexol (OMNIPAQUE) 300 MG/ML solution 100 mL (100 mLs Intravenous Contrast Given 02/27/22 1420)    ED Course/ Medical Decision Making/ A&P                           Medical Decision Making Amount and/or Complexity of Data Reviewed Labs: ordered. Radiology: ordered.  Risk Prescription drug management.   This patient presents to the ED for concern of abdominal pain, this involves an extensive number of treatment options, and is a complaint that carries with it a high risk of complications and morbidity.  The  differential diagnosis includes nephrolithiasis, pyelonephritis, diverticulitis, pancreatitis, gastritis, mesenteric ischemia, AAA, appendicitis, gastroenteritis, peritonitis, constipation, UTI, biliary disease, IBS, IBD, PUD, hepatitis  Co morbidities that complicate the patient evaluation  See HPI   Additional history obtained:  Additional history obtained from EMR External records from outside source obtained and reviewed including prior CT imaging of abdomen and pelvis from 10/22/2020   Lab Tests:  I Ordered, and personally interpreted labs.  The pertinent results include: No leukocytosis noted.  No evidence of anemia.  Platelets within normal range.  No electrolyte abnormalities noted.  Renal function within normal limits.  No abnormalities of hepatic function panel.  UA without abnormalities; since patient is symptomatic, will send off urine culture for further evaluation.   Imaging Studies ordered:  I ordered imaging studies including CT abdomen pelvis I independently visualized and interpreted imaging which showed distal colonic diverticulosis without evidence of diverticulitis.  Mild intrahepatic and extrahepatic biliary dilation postcholecystectomy from 2022 with no elevation of LFTs.  No acute intra-abdominal or intrapelvic abnormalities. I agree with the radiologist interpretation  Cardiac Monitoring: / EKG:  The patient was maintained on a cardiac monitor.  I personally viewed and interpreted the cardiac monitored which showed an underlying rhythm of: Sinus rhythm   Consultations Obtained:  N/a   Problem List / ED Course / Critical interventions / Medication management  Abdominal pain I ordered medication including Zofran for nausea, morphine for pain.   Reevaluation of the patient after these medicines showed that the patient improved I have reviewed the patients home medicines and have made adjustments as needed   Social Determinants of Health:  Denies  tobacco, illicit drug use.   Test / Admission - Considered:  Left flank pain Vitals signs  within normal range and stable throughout visit. Laboratory/imaging studies significant for: See above Unsure exact etiology of patient's pain given lack of laboratory imaging findings as well as reassuring physical exam.  Patient could be symptomatic acute cystitis without urinalysis changes at this point.  We will treat with antibiotics given patient's current symptoms.  Given patient's flank pain, no evidence of nephrolithiasis/pyelonephritis.  Patient could be developing varicella-zoster but no clinical evidence for associated rash today while emergency department.  No evidence of trauma to affected area.  Patient does have mild intra and extrahepatic biliary dilation from prior study before cholecystectomy performed; no reciprocal elevation in bilirubin, liver function tests, alkaline phosphatase without associated right upper quadrant pain.  Patient's nausea to be treated outpatient with Zofran to take as needed.  Pain to be controlled with ibuprofen/Tylenol as needed.  Regarding feelings of bloating, patient recommended diet adjustment as well as adequate water intake and physical exercise.  Close follow-up with PCP recommended  for reevaluation in 3 to 5 days.  Treatment plan discussed at length with patient and she knowledge understanding was agreeable to said plan. Worrisome signs and symptoms were discussed with the patient, and the patient acknowledged understanding to return to the ED if noticed. Patient was stable upon discharge.  '        Final Clinical Impression(s) / ED Diagnoses Final diagnoses:  Left flank pain    Rx / DC Orders ED Discharge Orders          Ordered    ondansetron (ZOFRAN) 4 MG tablet  Every 6 hours        02/27/22 1521    cephALEXin (KEFLEX) 500 MG capsule  4 times daily        02/27/22 1521              Wilnette Kales, [place] 02/27/22 1507     Wilnette Kales, [place] 02/27/22 1524    Teressa Lower, MD 03/01/22 1145

## 2022-03-01 LAB — URINE CULTURE

## 2022-03-03 ENCOUNTER — Telehealth: Payer: Self-pay

## 2022-03-03 NOTE — Telephone Encounter (Signed)
   Reason for call: ED-Follow up Visit   Patient  visit on 02/27/2022  at [facility] was for Left Flank Pain  Have you been able to follow up with your primary care physician? - Yes, Monday.  The patient was or was not able to obtain any needed medicine or equipment. - Yes  Are there diet recommendations that you are having difficulty following? - No  Patient expresses understanding of discharge instructions and education provided has no other needs at this time.   [facility]  [place], [place]  Main Phone: [phone]  E-mail: [email or web address]  Website: [email or web address]

## 2022-03-09 ENCOUNTER — Ambulatory Visit (INDEPENDENT_AMBULATORY_CARE_PROVIDER_SITE_OTHER): Payer: Medicare Other | Admitting: Gastroenterology

## 2022-03-09 ENCOUNTER — Encounter (INDEPENDENT_AMBULATORY_CARE_PROVIDER_SITE_OTHER): Payer: Self-pay | Admitting: Gastroenterology

## 2022-03-09 DIAGNOSIS — M940 Chondrocostal junction syndrome [Tietze]: Secondary | ICD-10-CM

## 2022-03-09 MED ORDER — NAPROXEN DR 500 MG PO TBEC: 500.0000 mg | DELAYED_RELEASE_TABLET | Freq: Two times a day (BID) | ORAL | 0 refills | Status: AC

## 2022-03-09 NOTE — Patient Instructions (Addendum)
Start naproxen 500 mg twice a day for 3 weeks Can take dicyclomine as needed for bloating

## 2022-03-09 NOTE — Progress Notes (Signed)
[name], M.D. Gastroenterology & Hepatology [facility] [address] [place],  [address]  Primary Care Physician: Valentino Nose, FNP [place] [id number]  I will communicate my assessment and recommendations to the referring MD via EMR.  Problems: GERD Chostochondritis  History of Present Illness: [name] is a 76 y.o. female with past medical history of GERD, hyperlipidemia, hypertension, who presents for follow up of back pain.  The patient was last seen on 09/25/2021. At that time, the patient was scheduled for a colonoscopy with findings described below.  She was also given a prescription for Dexilant 30 mg daily.  The patient reports on 02/17/2022 she presented new onset L lumbar pain and LLQ pain. Was given empiric management with ciprofloxacin and metronidazole without improvement. Patient went to the ED on 02/27/2022 after presenting worsening LLQ abdominal and L sided flank pain, with associated bloating. CBC and CMP were within normal limits. UA was normal. Urine culture was contaminated. Also underwent a CT abdomen and pelvis with IV contrast which showed mild intra and extrahepatic ductal dilation, mildly increased compared to prior. She was discharged home on Keflex, Bentyl and Zofran.  States that she has very mild bloating at the moment but she is currently mostly experiencing pain in her L lumbar area, with some very mild discomfort in the LLQ. Is moving her bowels every 2-3 days while taking Benefiber. Has noticed some associated increased urination.  The patient denies having any nausea, vomiting, fever, chills, hematochezia, melena, hematemesis, diarrhea, jaundice, pruritus or weight loss.  Last Colonoscopy: 10/31/2021 - Fourteen 3 to 8 mm polyps in the ascending colon and in the cecum, removed with a cold snare. Resected and retrieved. - Two 2 mm polyps in the transverse colon and in the  ascending colon, removed with a cold snare. Resected and retrieved. - One 5 mm polyp in the descending colon, removed with a cold snare. Resected and retrieved. - Tortuous colon. - Diverticulosis in the sigmoid colon and in the descending colon. - Non-bleeding internal hemorrhoids.  Path: FINAL MICROSCOPIC DIAGNOSIS:   A.   COLON, CECAL, ASCENDING, POLYPECTOMY:  Findings consistent with tubular adenoma.  No high-grade dysplasia or malignancy is seen.   B.   COLON, TRANSVERSE, POLYPECTOMY:  Findings consistent with tubular adenoma.  No high-grade dysplasia or malignancy is seen.   C.   COLON, DESCENDING, TRANSVERSE, POLYPECTOMY:  Findings consistent with tubular adenoma.  No high-grade dysplasia or malignancy is seen.   Recommended to repeat in 2 years  Past Medical History: Past Medical History:  Diagnosis Date   Anxiety    Arthritis    "all over; mostly knees and shoulders"  (06/04/2015)    Family history of adverse reaction to anesthesia    Patients mother had bad N/V after anesthesia   GERD (gastroesophageal reflux disease)    High cholesterol    Hypertension    Primary localized osteoarthritis of left knee     Past Surgical History: Past Surgical History:  Procedure Laterality Date   APPENDECTOMY     CHOLECYSTECTOMY OPEN     pain   COLONOSCOPY N/A 04/22/2016   Procedure: COLONOSCOPY;  Surgeon: Rogene Houston, MD;  [facility];  Service: Endoscopy;  Laterality: N/A;  1200   COLONOSCOPY W/ POLYPECTOMY     COLONOSCOPY WITH PROPOFOL N/A 10/31/2021   Procedure: COLONOSCOPY WITH PROPOFOL;  Surgeon: Harvel Quale, MD;  [facility]: [facility];  Service: Gastroenterology;  Laterality: N/A;  935   ESOPHAGOGASTRODUODENOSCOPY N/A 09/08/2017   Procedure: ESOPHAGOGASTRODUODENOSCOPY (EGD) WITH ESOPHAGEAL DILATION;  Surgeon: Rogene Houston, MD;  [facility];  Service: Endoscopy;  Laterality: N/A;  3:00   JOINT REPLACEMENT     KNEE ARTHROSCOPY  W/ MENISCECTOMY Left    KNEE SURGERY Left 1990s   MASS EXCISION N/A 07/27/2016   Procedure: EXCISION OF SEBACEOUS CYST ON NECK;  Surgeon: Leta Baptist, MD;  Location: [facility];  Service: ENT;  Laterality: N/A;   OOPHORECTOMY     for a cyst ? side   POLYPECTOMY  10/31/2021   Procedure: POLYPECTOMY;  Surgeon: Harvel Quale, MD;  [facility];  Service: Gastroenterology;;   TOTAL KNEE ARTHROPLASTY Left 06/03/2015   TOTAL KNEE ARTHROPLASTY Left 06/03/2015   Procedure: TOTAL LEFT KNEE ARTHROPLASTY;  Surgeon: Elsie Saas, MD;  Location: [facility];  Service: Orthopedics;  Laterality: Left;   TUBAL LIGATION      Family History: Family History  Problem Relation Age of Onset   Heart disease Mother    Diabetes Mellitus II Mother    Kidney disease Mother    Cancer Mother    Hypertension Father    Cancer Father    Hypertension Brother    Hypertension Sister    Colon cancer Neg Hx     Social History: Social History   Tobacco Use  Smoking Status Never  Smokeless Tobacco Never   Social History   Substance and Sexual Activity  Alcohol Use No   Alcohol/week: 0.0 standard drinks of alcohol   Social History   Substance and Sexual Activity  Drug Use No    Allergies: Allergies  Allergen Reactions   Cefzil [Cefprozil] Rash    Medications: Current Outpatient Medications  Medication Sig Dispense Refill   Cholecalciferol (VITAMIN D-3) 5000 units TABS Take 5,000 Units by mouth in the morning.     Dexlansoprazole (DEXILANT) 30 MG capsule Take 1 capsule (30 mg total) by mouth daily. 90 capsule 3   dicyclomine (BENTYL) 20 MG tablet Take 1 tablet (20 mg total) by mouth 2 (two) times daily. 20 tablet 0   gabapentin (NEURONTIN) 300 MG capsule Take 300 mg by mouth at bedtime.     hydrochlorothiazide (MICROZIDE) 12.5 MG capsule Take 12.5 mg by mouth in the morning.     losartan (COZAAR) 50 MG tablet Take 50 mg by mouth 2 (two) times daily.     montelukast  (SINGULAIR) 10 MG tablet Take 10 mg by mouth at bedtime.     Multiple Vitamins-Minerals (PRESERVISION AREDS 2) CAPS Take 1 capsule by mouth 2 (two) times daily.     rosuvastatin (CRESTOR) 5 MG tablet Take 5 mg by mouth every evening.     sucralfate (CARAFATE) 1 G tablet Take 1 g by mouth in the morning.     zolpidem (AMBIEN) 10 MG tablet Take 10 mg by mouth at bedtime.     No current facility-administered medications for this visit.    Review of Systems: GENERAL: negative for malaise, night sweats HEENT: No changes in hearing or vision, no nose bleeds or other nasal problems. NECK: Negative for lumps, goiter, pain and significant neck swelling RESPIRATORY: Negative for cough, wheezing CARDIOVASCULAR: Negative for chest pain, leg swelling, palpitations, orthopnea GI: SEE HPI MUSCULOSKELETAL: Negative for joint pain or swelling, back pain, and muscle pain. SKIN: Negative for lesions, rash PSYCH: Negative for sleep disturbance, mood disorder and recent psychosocial stressors. HEMATOLOGY Negative for prolonged bleeding,  bruising easily, and swollen nodes. ENDOCRINE: Negative for cold or heat intolerance, polyuria, polydipsia and goiter. NEURO: negative for tremor, gait imbalance, syncope and seizures. The remainder of the review of systems is noncontributory.   Physical Exam: BP 138/69 (BP Location: Left Arm, Patient Position: Sitting, Cuff Size: Large)   Pulse 92   Temp 98.1 F (36.7 C) (Oral)   Ht '5\' 5"'$  (1.651 m)   Wt 186 lb 6.4 oz (84.6 kg)   BMI 31.02 kg/m  GENERAL: The patient is AO x3, in no acute distress. HEENT: Head is normocephalic and atraumatic. EOMI are intact. Mouth is well hydrated and without lesions. NECK: Supple. No masses LUNGS: Clear to auscultation. No presence of rhonchi/wheezing/rales. Adequate chest expansion HEART: RRR, normal s1 and s2. THORAX: Tender to palpation in the left upper lumbar area around the costal ridge. ABDOMEN: Soft, nontender, no  guarding, no peritoneal signs, and nondistended. BS +. No masses. EXTREMITIES: Without any cyanosis, clubbing, rash, lesions or edema. NEUROLOGIC: AOx3, no focal motor deficit. SKIN: no jaundice, no rashes  Imaging/Labs: as above  I personally reviewed and interpreted the available labs, imaging and endoscopic files.  Impression and Plan: [name] is a 76 y.o. female with past medical history of GERD, hyperlipidemia, hypertension, who presents for follow up of back pain.  The patient has presented new onset of symptoms which are predominantly characterized by the presence of pain in the lower left costal area.  This is reproducible upon palpation.  Notably, there is no pain upon palpation of the abdomen or any other associated gastrointestinal symptoms.  Given the negative work-up she had in the ED, I consider her symptoms are possibly related to costochondritis for which I will prescribe a 3-week course of NSAIDs to alleviate her symptoms.  I advised her to follow-up with her PCP to determine if further work-up is warranted.  She understood and agreed.  - Start naproxen 500 mg twice a day for 3 weeks - Can take dicyclomine as needed for bloating  All questions were answered.      [name], MD Gastroenterology and Hepatology [facility] Gastroenterology

## 2022-03-24 DIAGNOSIS — Z23 Encounter for immunization: Secondary | ICD-10-CM | POA: Diagnosis not present

## 2022-03-24 DIAGNOSIS — G4700 Insomnia, unspecified: Secondary | ICD-10-CM | POA: Diagnosis not present

## 2022-03-24 DIAGNOSIS — M940 Chondrocostal junction syndrome [Tietze]: Secondary | ICD-10-CM | POA: Diagnosis not present

## 2022-03-24 DIAGNOSIS — R1032 Left lower quadrant pain: Secondary | ICD-10-CM | POA: Diagnosis not present

## 2022-04-06 DIAGNOSIS — J069 Acute upper respiratory infection, unspecified: Secondary | ICD-10-CM | POA: Diagnosis not present

## 2022-04-13 ENCOUNTER — Encounter (INDEPENDENT_AMBULATORY_CARE_PROVIDER_SITE_OTHER): Payer: Self-pay | Admitting: Gastroenterology

## 2022-05-12 DIAGNOSIS — J069 Acute upper respiratory infection, unspecified: Secondary | ICD-10-CM | POA: Diagnosis not present

## 2022-06-29 DIAGNOSIS — I10 Essential (primary) hypertension: Secondary | ICD-10-CM | POA: Diagnosis not present

## 2022-06-29 DIAGNOSIS — Z139 Encounter for screening, unspecified: Secondary | ICD-10-CM | POA: Diagnosis not present

## 2022-06-29 DIAGNOSIS — E782 Mixed hyperlipidemia: Secondary | ICD-10-CM | POA: Diagnosis not present

## 2022-07-03 DIAGNOSIS — K219 Gastro-esophageal reflux disease without esophagitis: Secondary | ICD-10-CM | POA: Diagnosis not present

## 2022-07-03 DIAGNOSIS — I10 Essential (primary) hypertension: Secondary | ICD-10-CM | POA: Diagnosis not present

## 2022-07-03 DIAGNOSIS — Z79899 Other long term (current) drug therapy: Secondary | ICD-10-CM | POA: Diagnosis not present

## 2022-07-03 DIAGNOSIS — Z0000 Encounter for general adult medical examination without abnormal findings: Secondary | ICD-10-CM | POA: Diagnosis not present

## 2022-07-03 DIAGNOSIS — M792 Neuralgia and neuritis, unspecified: Secondary | ICD-10-CM | POA: Diagnosis not present

## 2022-07-03 DIAGNOSIS — Z0001 Encounter for general adult medical examination with abnormal findings: Secondary | ICD-10-CM | POA: Diagnosis not present

## 2022-07-03 DIAGNOSIS — J302 Other seasonal allergic rhinitis: Secondary | ICD-10-CM | POA: Diagnosis not present

## 2022-07-03 DIAGNOSIS — E782 Mixed hyperlipidemia: Secondary | ICD-10-CM | POA: Diagnosis not present

## 2022-07-03 DIAGNOSIS — M152 Bouchard's nodes (with arthropathy): Secondary | ICD-10-CM | POA: Diagnosis not present

## 2022-07-29 DIAGNOSIS — M65331 Trigger finger, right middle finger: Secondary | ICD-10-CM | POA: Diagnosis not present

## 2022-09-01 DIAGNOSIS — J019 Acute sinusitis, unspecified: Secondary | ICD-10-CM | POA: Diagnosis not present

## 2022-09-03 DIAGNOSIS — J019 Acute sinusitis, unspecified: Secondary | ICD-10-CM | POA: Diagnosis not present

## 2022-09-07 DIAGNOSIS — M546 Pain in thoracic spine: Secondary | ICD-10-CM | POA: Diagnosis not present

## 2022-09-07 DIAGNOSIS — M9901 Segmental and somatic dysfunction of cervical region: Secondary | ICD-10-CM | POA: Diagnosis not present

## 2022-09-07 DIAGNOSIS — M542 Cervicalgia: Secondary | ICD-10-CM | POA: Diagnosis not present

## 2022-09-07 DIAGNOSIS — M9902 Segmental and somatic dysfunction of thoracic region: Secondary | ICD-10-CM | POA: Diagnosis not present

## 2022-09-10 DIAGNOSIS — M9901 Segmental and somatic dysfunction of cervical region: Secondary | ICD-10-CM | POA: Diagnosis not present

## 2022-09-10 DIAGNOSIS — M542 Cervicalgia: Secondary | ICD-10-CM | POA: Diagnosis not present

## 2022-09-10 DIAGNOSIS — M9902 Segmental and somatic dysfunction of thoracic region: Secondary | ICD-10-CM | POA: Diagnosis not present

## 2022-09-10 DIAGNOSIS — M546 Pain in thoracic spine: Secondary | ICD-10-CM | POA: Diagnosis not present

## 2022-09-16 DIAGNOSIS — M9902 Segmental and somatic dysfunction of thoracic region: Secondary | ICD-10-CM | POA: Diagnosis not present

## 2022-09-16 DIAGNOSIS — M542 Cervicalgia: Secondary | ICD-10-CM | POA: Diagnosis not present

## 2022-09-16 DIAGNOSIS — M9901 Segmental and somatic dysfunction of cervical region: Secondary | ICD-10-CM | POA: Diagnosis not present

## 2022-09-16 DIAGNOSIS — M546 Pain in thoracic spine: Secondary | ICD-10-CM | POA: Diagnosis not present

## 2022-09-22 NOTE — Progress Notes (Signed)
[facility] - Clinic Note  09/25/2022   CHIEF COMPLAINT Patient presents for Retina Evaluation  HISTORY OF PRESENT ILLNESS: [name] is a 77 y.o. female who presents to the clinic today for:  HPI     Retina Evaluation   In both eyes.  Associated Symptoms Floaters.  I, the attending physician,  performed the HPI with the patient and updated documentation appropriately.        Comments   Patient here for Retina Evaluation. Referred by Dr Charise Killian. Patient states vision hasn't been as good since cataract surgery 3-4 years ago. OS not as good. Sees floaters OS. Feels real real dry. No eye pain. Dr Charise Killian saw ARMD.       Last edited by Rennis Chris, MD on 09/27/2022  1:17 AM.    Pt is here on the referral of Dr. Charise Killian for concern of non-exu ARMD OU, pt states she uses Systane for dry eyes, pt states she has been taking Preservision for 4 years, pt has occasional pain associated with dry eyes   Referring physician: Daisy Lazar, DO [address] [place],  [place] [address]  HISTORICAL INFORMATION:  Selected notes from the MEDICAL RECORD NUMBER Referred by Dr. Charise Killian LEE:  Ocular Hx- PMH-   CURRENT MEDICATIONS: No current outpatient medications on file. (Ophthalmic Drugs)   No current facility-administered medications for this visit. (Ophthalmic Drugs)   Current Outpatient Medications (Other)  Medication Sig   Cholecalciferol (VITAMIN D-3) 5000 units TABS Take 5,000 Units by mouth in the morning.   gabapentin (NEURONTIN) 300 MG capsule Take 300 mg by mouth at bedtime.   hydrochlorothiazide (MICROZIDE) 12.5 MG capsule Take 12.5 mg by mouth in the morning.   losartan (COZAAR) 50 MG tablet Take 50 mg by mouth 2 (two) times daily.   montelukast (SINGULAIR) 10 MG tablet Take 10 mg by mouth at bedtime.   Multiple Vitamins-Minerals (PRESERVISION AREDS 2) CAPS Take 1 capsule by mouth 2 (two) times daily.   rosuvastatin (CRESTOR) 5 MG tablet Take 5 mg by mouth  every evening.   sucralfate (CARAFATE) 1 G tablet Take 1 g by mouth in the morning.   zolpidem (AMBIEN) 10 MG tablet Take 10 mg by mouth at bedtime.   Dexlansoprazole (DEXILANT) 30 MG capsule Take 1 capsule (30 mg total) by mouth daily.   dicyclomine (BENTYL) 20 MG tablet Take 1 tablet (20 mg total) by mouth 2 (two) times daily.   No current facility-administered medications for this visit. (Other)   REVIEW OF SYSTEMS: ROS   Positive for: Musculoskeletal, Cardiovascular, Eyes Last edited by Laddie Aquas, COA on 09/25/2022  8:37 AM.     ALLERGIES Allergies  Allergen Reactions   Cefzil [Cefprozil] Rash   PAST MEDICAL HISTORY Past Medical History:  Diagnosis Date   Anxiety    Arthritis    "all over; mostly knees and shoulders"  (06/04/2015)    Family history of adverse reaction to anesthesia    Patients mother had bad N/V after anesthesia   GERD (gastroesophageal reflux disease)    High cholesterol    Hypertension    Primary localized osteoarthritis of left knee    Past Surgical History:  Procedure Laterality Date   APPENDECTOMY     CHOLECYSTECTOMY OPEN     pain   COLONOSCOPY N/A 04/22/2016   Procedure: COLONOSCOPY;  Surgeon: Malissa Hippo, MD;  [facility];  Service: Endoscopy;  Laterality: N/A;  1200   COLONOSCOPY W/ POLYPECTOMY  COLONOSCOPY WITH PROPOFOL N/A 10/31/2021   Procedure: COLONOSCOPY WITH PROPOFOL;  Surgeon: Dolores Frame, MD;  [facility];  Service: Gastroenterology;  Laterality: N/A;  935   ESOPHAGOGASTRODUODENOSCOPY N/A 09/08/2017   Procedure: ESOPHAGOGASTRODUODENOSCOPY (EGD) WITH ESOPHAGEAL DILATION;  Surgeon: Malissa Hippo, MD;  [facility];  Service: Endoscopy;  Laterality: N/A;  3:00   JOINT REPLACEMENT     KNEE ARTHROSCOPY W/ MENISCECTOMY Left    KNEE SURGERY Left 1990s   MASS EXCISION N/A 07/27/2016   Procedure: EXCISION OF SEBACEOUS CYST ON NECK;  Surgeon: Newman Pies, MD;  Location: [facility];  Service: ENT;  Laterality: N/A;   OOPHORECTOMY     for a cyst ? side   POLYPECTOMY  10/31/2021   Procedure: POLYPECTOMY;  Surgeon: Dolores Frame, MD;  [facility];  Service: Gastroenterology;;   TOTAL KNEE ARTHROPLASTY Left 06/03/2015   TOTAL KNEE ARTHROPLASTY Left 06/03/2015   Procedure: TOTAL LEFT KNEE ARTHROPLASTY;  Surgeon: Salvatore Marvel, MD;  [facility];  Service: Orthopedics;  Laterality: Left;   TUBAL LIGATION     FAMILY HISTORY Family History  Problem Relation Age of Onset   Heart disease Mother    Diabetes Mellitus II Mother    Kidney disease Mother    Cancer Mother    Hypertension Father    Cancer Father    Hypertension Brother    Hypertension Sister    Colon cancer Neg Hx    SOCIAL HISTORY Social History   Tobacco Use   Smoking status: Never   Smokeless tobacco: Never  Vaping Use   Vaping Use: Never used  Substance Use Topics   Alcohol use: No    Alcohol/week: 0.0 standard drinks of alcohol   Drug use: No       OPHTHALMIC EXAM:  Base Eye Exam     Visual Acuity (Snellen - Linear)       Right Left   Dist cc 20/30 -2 20/30 +2   Dist ph cc NI 20/20 -2    Correction: Glasses         Tonometry (Tonopen, 8:32 AM)       Right Left   Pressure 14 13         Pupils       Dark Light Shape React APD   Right 3 2 Round Brisk None   Left 3 2 Round Brisk None         Visual Fields (Counting fingers)       Left Right    Full Full         Extraocular Movement       Right Left    Full, Ortho Full, Ortho         Neuro/Psych     Oriented x3: Yes   Mood/Affect: Normal         Dilation     Both eyes: 1.0% Mydriacyl, 2.5% Phenylephrine @ 8:32 AM           Slit Lamp and Fundus Exam     Slit Lamp Exam       Right Left   Lids/Lashes Dermatochalasis - upper lid Dermatochalasis - upper lid   Conjunctiva/Sclera White and quiet White and quiet   Cornea well healed cataract wound, 1+  Punctate epithelial erosions, trace tear film debris, focal endo pigment nasal paracentral trace tear film debris, well healed cataract wound   Anterior Chamber deep and clear deep and clear  Iris Round and dilated Round and dilated   Lens PC IOL in good position PC IOL in good position   Anterior Vitreous Vitreous syneresis, Posterior vitreous detachment Vitreous syneresis         Fundus Exam       Right Left   Disc Pink and Sharp Pink and Sharp   C/D Ratio 0.2 0.1   Macula Flat, Blunted foveal reflex, central PEDs, RPE mottling and clumping, no heme Flat, Blunted foveal reflex, prominent central PEDs, RPE mottling and clumping, no heme or edema   Vessels attenuated, Tortuous, mild copper wiring attenuated, mild tortuosity   Periphery Attached, mild reticular degeneration, mild peripheral drusen, No heme Attached, mild reticular degeneration, mild peripheral drusen, No heme           Refraction     Wearing Rx       Sphere Cylinder Axis Add   Right -1.00 +1.50 015 +2.50   Left -1.50 +1.50 164 +2.50         Manifest Refraction       Sphere Cylinder Axis Dist VA   Right -1.50 +1.50 015 20/30+1   Left      Refracted OD MS          IMAGING AND PROCEDURES  Imaging and Procedures for 09/25/2022  OCT, Retina - OU - Both Eyes       Right Eye Quality was good. Central Foveal Thickness: 334. Progression has no prior data. Findings include normal observations, no IRF, no SRF, retinal drusen , pigment epithelial detachment.   Left Eye Quality was good. Central Foveal Thickness: 305. Progression has no prior data. Findings include no IRF, no SRF, abnormal foveal contour, retinal drusen , pigment epithelial detachment (Prominent central PEDs).   Notes *Images captured and stored on drive  Diagnosis / Impression:  Non-exu ARMD OU  Clinical management:  See below  Abbreviations: NFP - Normal foveal profile. CME - cystoid macular edema. PED - pigment epithelial  detachment. IRF - intraretinal fluid. SRF - subretinal fluid. EZ - ellipsoid zone. ERM - epiretinal membrane. ORA - outer retinal atrophy. ORT - outer retinal tubulation. SRHM - subretinal hyper-reflective material. IRHM - intraretinal hyper-reflective material           ASSESSMENT/PLAN:   ICD-10-CM   1. Intermediate stage nonexudative age-related macular degeneration of both eyes  H35.3132 OCT, Retina - OU - Both Eyes    2. Essential hypertension  I10     3. Hypertensive retinopathy of both eyes  H35.033     4. Pseudophakia, both eyes  Z96.1      Age related macular degeneration, non-exudative, both eyes - intermediate stage with +PED centrally -- no heme or exudative disease  - The incidence, anatomy, and pathology of dry AMD, risk of progression, and the AREDS and AREDS 2 study including smoking risks discussed with patient.  - BCVA OD 20/30, OS 20/20  - Recommend amsler grid monitoring  - f/u 4-6 months, DFE, OCT  2,3. Hypertensive retinopathy OU - discussed importance of tight BP control - monitor  4. Pseudophakia OU  - s/p CE/IOL  - IOL in good position, doing well  - monitor   Ophthalmic Meds Ordered this visit:  No orders of the defined types were placed in this encounter.    Return for 4-6 mos - , Dilated Exam, OCT.  There are no Patient Instructions on file for this visit.  Explained the diagnoses, plan, and follow up with the patient and they  expressed understanding.  Patient expressed understanding of the importance of proper follow up care.   This document serves as a record of services personally performed by Karie Chimera, MD, PhD. It was created on their behalf by Berlin Hun COT, The creation of this record is the provider's dictation and/or activities during the visit.    Electronically signed by: Berlin Hun COT 04/23/204 1:19 AM   This document serves as a record of services personally performed by Karie Chimera, MD, PhD. It was  created on their behalf by Glee Arvin. Manson Passey, OA an ophthalmic technician. The creation of this record is the provider's dictation and/or activities during the visit.    Electronically signed by: Glee Arvin. Manson Passey, [place] 04.26.2024 1:19 AM  Karie Chimera, M.D., Ph.D. Diseases & Surgery of the Retina and Vitreous [facility] 09/25/2022  I have reviewed the above documentation for accuracy and completeness, and I agree with the above. Karie Chimera, M.D., Ph.D. 09/27/22 1:21 AM  Abbreviations: M myopia (nearsighted); A astigmatism; H hyperopia (farsighted); P presbyopia; Mrx spectacle prescription;  CTL contact lenses; OD right eye; OS left eye; OU both eyes  XT exotropia; ET esotropia; PEK punctate epithelial keratitis; PEE punctate epithelial erosions; DES dry eye syndrome; MGD meibomian gland dysfunction; ATs artificial tears; PFAT's preservative free artificial tears; NSC nuclear sclerotic cataract; PSC posterior subcapsular cataract; ERM epi-retinal membrane; PVD posterior vitreous detachment; RD retinal detachment; DM diabetes mellitus; DR diabetic retinopathy; NPDR non-proliferative diabetic retinopathy; PDR proliferative diabetic retinopathy; CSME clinically significant macular edema; DME diabetic macular edema; dbh dot blot hemorrhages; CWS cotton wool spot; POAG primary open angle glaucoma; C/D cup-to-disc ratio; HVF humphrey visual field; GVF goldmann visual field; OCT optical coherence tomography; IOP intraocular pressure; BRVO Branch retinal vein occlusion; CRVO central retinal vein occlusion; CRAO central retinal artery occlusion; BRAO branch retinal artery occlusion; RT retinal tear; SB scleral buckle; PPV pars plana vitrectomy; VH Vitreous hemorrhage; PRP panretinal laser photocoagulation; IVK intravitreal kenalog; VMT vitreomacular traction; MH Macular hole;  NVD neovascularization of the disc; NVE neovascularization elsewhere; AREDS age related eye disease study; ARMD  age related macular degeneration; POAG primary open angle glaucoma; EBMD epithelial/anterior basement membrane dystrophy; ACIOL anterior chamber intraocular lens; IOL intraocular lens; PCIOL posterior chamber intraocular lens; Phaco/IOL phacoemulsification with intraocular lens placement; PRK photorefractive keratectomy; LASIK laser assisted in situ keratomileusis; HTN hypertension; DM diabetes mellitus; COPD chronic obstructive pulmonary disease

## 2022-09-25 ENCOUNTER — Ambulatory Visit (INDEPENDENT_AMBULATORY_CARE_PROVIDER_SITE_OTHER): Payer: Medicare Other | Admitting: Ophthalmology

## 2022-09-25 ENCOUNTER — Encounter (INDEPENDENT_AMBULATORY_CARE_PROVIDER_SITE_OTHER): Payer: Self-pay | Admitting: Ophthalmology

## 2022-09-25 DIAGNOSIS — Z961 Presence of intraocular lens: Secondary | ICD-10-CM

## 2022-09-25 DIAGNOSIS — I10 Essential (primary) hypertension: Secondary | ICD-10-CM

## 2022-09-25 DIAGNOSIS — H353132 Nonexudative age-related macular degeneration, bilateral, intermediate dry stage: Secondary | ICD-10-CM | POA: Diagnosis not present

## 2022-09-25 DIAGNOSIS — H3581 Retinal edema: Secondary | ICD-10-CM

## 2022-09-25 DIAGNOSIS — H35033 Hypertensive retinopathy, bilateral: Secondary | ICD-10-CM | POA: Diagnosis not present

## 2022-09-27 ENCOUNTER — Encounter (INDEPENDENT_AMBULATORY_CARE_PROVIDER_SITE_OTHER): Payer: Self-pay | Admitting: Ophthalmology

## 2022-09-28 ENCOUNTER — Encounter (INDEPENDENT_AMBULATORY_CARE_PROVIDER_SITE_OTHER): Payer: Self-pay | Admitting: Gastroenterology

## 2022-09-28 ENCOUNTER — Ambulatory Visit (INDEPENDENT_AMBULATORY_CARE_PROVIDER_SITE_OTHER): Payer: Medicare Other | Admitting: Gastroenterology

## 2022-09-28 VITALS — BP 115/73 | HR 83 | Temp 98.7°F | Ht 65.0 in | Wt 187.3 lb

## 2022-09-28 DIAGNOSIS — K219 Gastro-esophageal reflux disease without esophagitis: Secondary | ICD-10-CM

## 2022-09-28 NOTE — Patient Instructions (Signed)
Please continue on prevacid 30mg  daily Be mindful that greasy, spicy, fried, citrus foods, caffeine, carbonated drinks, chocolate and alcohol can increase reflux symptoms Stay upright 2-3 hours after eating, prior to lying down and avoid eating late in the evenings.  You are due for colonoscopy in summer of 2025, we will send a reminder closer to time to get you scheduled  As discussed, if PCP is filling your GERD medication, we can plan to see you on as needed basis since you are having no GI issues  It was a pleasure to see you today. I want to create trusting relationships with patients and provide genuine, compassionate, and quality care. I truly value your feedback! please be on the lookout for a survey regarding your visit with me today. I appreciate your input about our visit and your time in completing this!    Reise Hietala L. Jeanmarie Hubert, MSN, APRN, AGNP-C Adult-Gerontology Nurse Practitioner [facility] at [facility]

## 2022-09-28 NOTE — Progress Notes (Addendum)
Referring Provider: Roe Rutherford, NP Primary Care Physician:  Leone Payor, FNP Primary GI Physician: Levon Hedger   Chief Complaint  Patient presents with   Gastroesophageal Reflux    Follow up on GERD. Reports doing well and no issues. Takes prevacid 30mg  qam that pcp gave her about 6 months ago.    HPI:   [name] is a 77 y.o. female with past medical history of GERD, hyperlipidemia, hypertension,   Patient presenting today for follow up of GERD.  Last seen October 2023, at that time presented new onset L lumbar pain and LLQ pain. Was given empiric management with ciprofloxacin and metronidazole without improvement.  went to the ED on 02/27/2022 after presenting worsening LLQ abdominal and L sided flank pain, with associated bloating. CBC and CMP were within normal limits. UA was normal. Urine culture was contaminated. Also underwent a CT abdomen and pelvis with IV contrast which showed mild intra and extrahepatic ductal dilation, mildly increased compared to prior. She was discharged home on Keflex, Bentyl and Zofran.   Reported  very mild bloating at OV but she is currently mostly experiencing pain in her L lumbar area, with some very mild discomfort in the LLQ. Is moving her bowels every 2-3 days while taking Benefiber. Has noticed some associated increased urination.  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   Previously on dexilant 30mg  daily at her April 2023 visit doing well and was continued   Present:  She states that she could not afford the dexilant anymore as it was $100 per month. She is now  on prevacid 30mg  daily that PCP filled, and doing well on this. No breakthrough symptoms. She denies any issues with her swallowing. No nausea or vomiting. No weight loss or changes in appetite, no rectal bleeding or melena. Has no GI complaints currently.   Last Colonoscopy: 10/31/2021 - Fourteen 3 to 8 mm polyps in the  ascending colon and in the cecum, removed with a cold snare. Resected and retrieved. - Two 2 mm polyps in the transverse colon and in the ascending colon, removed with a cold snare. Resected and retrieved. - One 5 mm polyp in the descending colon, removed with a cold snare. Resected and retrieved. - Tortuous colon. - Diverticulosis in the sigmoid colon and in the descending colon. - Non-bleeding internal hemorrhoids.   Path: FINAL MICROSCOPIC DIAGNOSIS:  A.   COLON, CECAL, ASCENDING, POLYPECTOMY:  Findings consistent with tubular adenoma.  No high-grade dysplasia or malignancy is seen.  B.   COLON, TRANSVERSE, POLYPECTOMY:  Findings consistent with tubular adenoma.  No high-grade dysplasia or malignancy is seen.  C.   COLON, DESCENDING, TRANSVERSE, POLYPECTOMY:  Findings consistent with tubular adenoma.  No high-grade dysplasia or malignancy is seen.    Recommended to repeat in 2 years    Past Medical History:  Diagnosis Date   Anxiety    Arthritis    "all over; mostly knees and shoulders"  (06/04/2015)    Family history of adverse reaction to anesthesia    Patients mother had bad N/V after anesthesia   GERD (gastroesophageal reflux disease)    High cholesterol    Hypertension    Primary localized osteoarthritis of left knee     Past Surgical History:  Procedure Laterality Date   APPENDECTOMY     CHOLECYSTECTOMY OPEN     pain   COLONOSCOPY N/A 04/22/2016   Procedure: COLONOSCOPY;  Surgeon: Malissa Hippo, MD;  [facility];  Service: Endoscopy;  Laterality: N/A;  1200   COLONOSCOPY W/ POLYPECTOMY     COLONOSCOPY WITH PROPOFOL N/A 10/31/2021   Procedure: COLONOSCOPY WITH PROPOFOL;  Surgeon: Dolores Frame, MD;  [facility];  Service: Gastroenterology;  Laterality: N/A;  935   ESOPHAGOGASTRODUODENOSCOPY N/A 09/08/2017   Procedure: ESOPHAGOGASTRODUODENOSCOPY (EGD) WITH ESOPHAGEAL DILATION;  Surgeon: Malissa Hippo, MD;  [facility];  Service: Endoscopy;  Laterality: N/A;  3:00   JOINT REPLACEMENT     KNEE ARTHROSCOPY W/ MENISCECTOMY Left    KNEE SURGERY Left 1990s   MASS EXCISION N/A 07/27/2016   Procedure: EXCISION OF SEBACEOUS CYST ON NECK;  Surgeon: Newman Pies, MD;  Location: [facility];  Service: ENT;  Laterality: N/A;   OOPHORECTOMY     for a cyst ? side   POLYPECTOMY  10/31/2021   Procedure: POLYPECTOMY;  Surgeon: Dolores Frame, MD;  [facility];  Service: Gastroenterology;;   TOTAL KNEE ARTHROPLASTY Left 06/03/2015   TOTAL KNEE ARTHROPLASTY Left 06/03/2015   Procedure: TOTAL LEFT KNEE ARTHROPLASTY;  Surgeon: Salvatore Marvel, MD;  Location: [facility] OR;  Service: Orthopedics;  Laterality: Left;   TUBAL LIGATION      Current Outpatient Medications  Medication Sig Dispense Refill   Cholecalciferol (VITAMIN D-3) 5000 units TABS Take 5,000 Units by mouth in the morning.     gabapentin (NEURONTIN) 300 MG capsule Take 300 mg by mouth at bedtime.     hydrochlorothiazide (MICROZIDE) 12.5 MG capsule Take 12.5 mg by mouth in the morning.     lansoprazole (PREVACID) 30 MG capsule Take 30 mg by mouth every morning.     losartan (COZAAR) 50 MG tablet Take 50 mg by mouth 2 (two) times daily.     montelukast (SINGULAIR) 10 MG tablet Take 10 mg by mouth at bedtime.     Multiple Vitamins-Minerals (PRESERVISION AREDS 2) CAPS Take 1 capsule by mouth 2 (two) times daily.     rosuvastatin (CRESTOR) 5 MG tablet Take 5 mg by mouth every evening.     sucralfate (CARAFATE) 1 G tablet Take 1 g by mouth in the morning.     zolpidem (AMBIEN) 10 MG tablet Take 10 mg by mouth at bedtime.     No current facility-administered medications for this visit.    Allergies as of 09/28/2022 - Review Complete 09/28/2022  Allergen Reaction Noted   Cefzil [cefprozil] Rash 09/01/2017    Family History  Problem Relation Age of Onset   Heart disease Mother    Diabetes Mellitus II Mother    Kidney disease Mother     Cancer Mother    Hypertension Father    Cancer Father    Hypertension Brother    Hypertension Sister    Colon cancer Neg Hx     Social History   Socioeconomic History   Marital status: Married    Spouse name: Hydrologist   Number of children: 6   Years of education: Not on file   Highest education level: Not on file  Occupational History   Not on file  Tobacco Use   Smoking status: Never   Smokeless tobacco: Never  Vaping Use   Vaping Use: Never used  Substance and Sexual Activity   Alcohol use: No    Alcohol/week: 0.0 standard drinks of alcohol   Drug use: No   Sexual activity: Not Currently  Other Topics Concern   Not on file  Social History Narrative  Lives with husband, Deion Forgue.  [phone] cell   Social Determinants of Health   Financial Resource Strain: Not on file  Food Insecurity: Not on file  Transportation Needs: Not on file  Physical Activity: Not on file  Stress: Not on file  Social Connections: Not on file    Review of systems General: negative for malaise, night sweats, fever, chills, weight loss Neck: Negative for lumps, goiter, pain and significant neck swelling Resp: Negative for cough, wheezing, dyspnea at rest CV: Negative for chest pain, leg swelling, palpitations, orthopnea GI: denies melena, hematochezia, nausea, vomiting, diarrhea, constipation, dysphagia, odyonophagia, early satiety or unintentional weight loss.  MSK: Negative for joint pain or swelling, back pain, and muscle pain. Derm: Negative for itching or rash Psych: Denies depression, anxiety, memory loss, confusion. No homicidal or suicidal ideation.  Heme: Negative for prolonged bleeding, bruising easily, and swollen nodes. Endocrine: Negative for cold or heat intolerance, polyuria, polydipsia and goiter. Neuro: negative for tremor, gait imbalance, syncope and seizures. The remainder of the review of systems is noncontributory.  Physical Exam: BP 115/73 (BP Location: Left  Arm, Patient Position: Sitting, Cuff Size: Large)   Pulse 83   Temp 98.7 F (37.1 C) (Oral)   Ht 5\' 5"  (1.651 m)   Wt 187 lb 4.8 oz (85 kg)   BMI 31.17 kg/m  General:   Alert and oriented. No distress noted. Pleasant and cooperative.  Head:  Normocephalic and atraumatic. Eyes:  Conjuctiva clear without scleral icterus. Mouth:  Oral mucosa pink and moist. Good dentition. No lesions. Heart: Normal rate and rhythm, s1 and s2 heart sounds present.  Lungs: Clear lung sounds in all lobes. Respirations equal and unlabored. Abdomen:  +BS, soft, non-tender and non-distended. No rebound or guarding. No HSM or masses noted. Derm: No palmar erythema or jaundice Msk:  Symmetrical without gross deformities. Normal posture. Extremities:  Without edema. Neurologic:  Alert and  oriented x4 Psych:  Alert and cooperative. Normal mood and affect.  Invalid input(s): "6 MONTHS"   ASSESSMENT: [name] is a 77 y.o. female presenting today for follow up of GERD.   GERD: doing well on prevacid 30mg  once daily. Could no longer afford OOP cost of dexilant. She denies any breakthrough symptoms. No current GI symptoms.  PCP is filling her PPI at this time. Should continue with prevacid 30mg  daily and good reflux precautions  History of tubular adenomas of colon: last TCS was June 2023, she is due for repeat in June 2025. No rectal bleeding, melena, abdominal pain, weight loss. She is on recall list, will get her scheduled for this closer to time.    PLAN:  Continue with prevacid 30mg  daily  (PCP is filling) 2. Colonoscopy June 2025  3. Good reflux precautions   All questions were answered, patient verbalized understanding and is in agreement with plan as outlined above.   Follow Up: PRN  Dante Cooter L. Jeanmarie Hubert, MSN, APRN, AGNP-C Adult-Gerontology Nurse Practitioner [facility]  I have reviewed the note and agree with the APP's assessment as described in this progress note  Katrinka Blazing, MD Gastroenterology and Hepatology [facility]

## 2022-10-05 DIAGNOSIS — M791 Myalgia, unspecified site: Secondary | ICD-10-CM | POA: Diagnosis not present

## 2022-10-05 DIAGNOSIS — R059 Cough, unspecified: Secondary | ICD-10-CM | POA: Diagnosis not present

## 2022-10-14 ENCOUNTER — Ambulatory Visit (HOSPITAL_COMMUNITY)
Admission: RE | Admit: 2022-10-14 | Discharge: 2022-10-14 | Disposition: A | Discharge status: Home / Self Care | Payer: Medicare Other | Source: Ambulatory Visit | Attending: Family Medicine | Admitting: Family Medicine

## 2022-10-14 ENCOUNTER — Other Ambulatory Visit (HOSPITAL_COMMUNITY): Payer: Self-pay | Admitting: Family Medicine

## 2022-10-14 DIAGNOSIS — J069 Acute upper respiratory infection, unspecified: Secondary | ICD-10-CM | POA: Diagnosis not present

## 2022-10-14 DIAGNOSIS — R059 Cough, unspecified: Secondary | ICD-10-CM | POA: Diagnosis not present

## 2022-11-02 DIAGNOSIS — L309 Dermatitis, unspecified: Secondary | ICD-10-CM | POA: Diagnosis not present

## 2022-12-21 DIAGNOSIS — G629 Polyneuropathy, unspecified: Secondary | ICD-10-CM | POA: Diagnosis not present

## 2022-12-21 DIAGNOSIS — M7672 Peroneal tendinitis, left leg: Secondary | ICD-10-CM | POA: Diagnosis not present

## 2022-12-21 DIAGNOSIS — E782 Mixed hyperlipidemia: Secondary | ICD-10-CM | POA: Diagnosis not present

## 2023-01-18 DIAGNOSIS — K219 Gastro-esophageal reflux disease without esophagitis: Secondary | ICD-10-CM | POA: Diagnosis not present

## 2023-01-18 DIAGNOSIS — Z0001 Encounter for general adult medical examination with abnormal findings: Secondary | ICD-10-CM | POA: Diagnosis not present

## 2023-01-18 DIAGNOSIS — Z0000 Encounter for general adult medical examination without abnormal findings: Secondary | ICD-10-CM | POA: Diagnosis not present

## 2023-01-18 DIAGNOSIS — U071 COVID-19: Secondary | ICD-10-CM | POA: Diagnosis not present

## 2023-01-18 DIAGNOSIS — J302 Other seasonal allergic rhinitis: Secondary | ICD-10-CM | POA: Diagnosis not present

## 2023-01-18 DIAGNOSIS — I10 Essential (primary) hypertension: Secondary | ICD-10-CM | POA: Diagnosis not present

## 2023-01-18 DIAGNOSIS — Z79899 Other long term (current) drug therapy: Secondary | ICD-10-CM | POA: Diagnosis not present

## 2023-01-18 DIAGNOSIS — E782 Mixed hyperlipidemia: Secondary | ICD-10-CM | POA: Diagnosis not present

## 2023-01-18 DIAGNOSIS — M152 Bouchard's nodes (with arthropathy): Secondary | ICD-10-CM | POA: Diagnosis not present

## 2023-01-18 DIAGNOSIS — M792 Neuralgia and neuritis, unspecified: Secondary | ICD-10-CM | POA: Diagnosis not present

## 2023-01-25 ENCOUNTER — Encounter (INDEPENDENT_AMBULATORY_CARE_PROVIDER_SITE_OTHER): Payer: Medicare Other | Admitting: Ophthalmology

## 2023-02-04 NOTE — Progress Notes (Addendum)
[facility] - Clinic Note  02/08/2023   CHIEF COMPLAINT Patient presents for Retina Follow Up  HISTORY OF PRESENT ILLNESS: [name] is a 77 y.o. female who presents to the clinic today for:  HPI     Retina Follow Up   Patient presents with  Dry AMD.  In both eyes.  This started 4 months ago.  Duration of 4 months.  Since onset it is stable.  I, the attending physician,  performed the HPI with the patient and updated documentation appropriately.        Comments   4 month retina follow up ARMD pt is reporting no vision changes noticed she denies any flashes or floaters       Last edited by Rennis Chris, MD on 02/08/2023  8:53 AM.    Pt states vision is stable, she is using Systane at night only   Referring physician: Leone Payor, FNP [id number] Rosanne [place],  [place] [address]  HISTORICAL INFORMATION:  Selected notes from the MEDICAL RECORD NUMBER Referred by Dr. Charise Killian LEE:  Ocular Hx- PMH-   CURRENT MEDICATIONS: No current outpatient medications on file. (Ophthalmic Drugs)   No current facility-administered medications for this visit. (Ophthalmic Drugs)   Current Outpatient Medications (Other)  Medication Sig   Cholecalciferol (VITAMIN D-3) 5000 units TABS Take 5,000 Units by mouth in the morning.   gabapentin (NEURONTIN) 300 MG capsule Take 300 mg by mouth at bedtime.   hydrochlorothiazide (MICROZIDE) 12.5 MG capsule Take 12.5 mg by mouth in the morning.   lansoprazole (PREVACID) 30 MG capsule Take 30 mg by mouth every morning.   losartan (COZAAR) 50 MG tablet Take 50 mg by mouth 2 (two) times daily.   montelukast (SINGULAIR) 10 MG tablet Take 10 mg by mouth at bedtime.   Multiple Vitamins-Minerals (PRESERVISION AREDS 2) CAPS Take 1 capsule by mouth 2 (two) times daily.   rosuvastatin (CRESTOR) 5 MG tablet Take 5 mg by mouth every evening.   sucralfate (CARAFATE) 1 G tablet Take 1 g by mouth in the morning.   zolpidem (AMBIEN) 10 MG tablet  Take 10 mg by mouth at bedtime.   No current facility-administered medications for this visit. (Other)   REVIEW OF SYSTEMS: ROS   Positive for: Musculoskeletal, Cardiovascular, Eyes Last edited by Etheleen Mayhew, COT on 02/08/2023  7:59 AM.      ALLERGIES Allergies  Allergen Reactions   Cefzil [Cefprozil] Rash   PAST MEDICAL HISTORY Past Medical History:  Diagnosis Date   Anxiety    Arthritis    "all over; mostly knees and shoulders"  (06/04/2015)    Family history of adverse reaction to anesthesia    Patients mother had bad N/V after anesthesia   GERD (gastroesophageal reflux disease)    High cholesterol    Hypertension    Primary localized osteoarthritis of left knee    Past Surgical History:  Procedure Laterality Date   APPENDECTOMY     CHOLECYSTECTOMY OPEN     pain   COLONOSCOPY N/A 04/22/2016   Procedure: COLONOSCOPY;  Surgeon: Malissa Hippo, MD;  [facility];  Service: Endoscopy;  Laterality: N/A;  1200   COLONOSCOPY W/ POLYPECTOMY     COLONOSCOPY WITH PROPOFOL N/A 10/31/2021   Procedure: COLONOSCOPY WITH PROPOFOL;  Surgeon: Dolores Frame, MD;  [facility];  Service: Gastroenterology;  Laterality: N/A;  935   ESOPHAGOGASTRODUODENOSCOPY N/A 09/08/2017   Procedure: ESOPHAGOGASTRODUODENOSCOPY (EGD) WITH ESOPHAGEAL DILATION;  Surgeon: Malissa Hippo, MD;  [facility];  Service: Endoscopy;  Laterality: N/A;  3:00   JOINT REPLACEMENT     KNEE ARTHROSCOPY W/ MENISCECTOMY Left    KNEE SURGERY Left 1990s   MASS EXCISION N/A 07/27/2016   Procedure: EXCISION OF SEBACEOUS CYST ON NECK;  Surgeon: Newman Pies, MD;  Location: [facility];  Service: ENT;  Laterality: N/A;   OOPHORECTOMY     for a cyst ? side   POLYPECTOMY  10/31/2021   Procedure: POLYPECTOMY;  Surgeon: Dolores Frame, MD;  [facility];  Service: Gastroenterology;;   TOTAL KNEE ARTHROPLASTY Left 06/03/2015   TOTAL KNEE  ARTHROPLASTY Left 06/03/2015   Procedure: TOTAL LEFT KNEE ARTHROPLASTY;  Surgeon: Salvatore Marvel, MD;  Location: [facility];  Service: Orthopedics;  Laterality: Left;   TUBAL LIGATION     FAMILY HISTORY Family History  Problem Relation Age of Onset   Heart disease Mother    Diabetes Mellitus II Mother    Kidney disease Mother    Cancer Mother    Hypertension Father    Cancer Father    Hypertension Brother    Hypertension Sister    Colon cancer Neg Hx    SOCIAL HISTORY Social History   Tobacco Use   Smoking status: Never   Smokeless tobacco: Never  Vaping Use   Vaping status: Never Used  Substance Use Topics   Alcohol use: No    Alcohol/week: 0.0 standard drinks of alcohol   Drug use: No       OPHTHALMIC EXAM:  Base Eye Exam     Visual Acuity (Snellen - Linear)       Right Left   Dist cc 20/30 20/30 -2   Dist ph cc NI 20/25    Correction: Glasses         Tonometry (Tonopen, 8:03 AM)       Right Left   Pressure 12 12         Pupils       Pupils Dark Light Shape React APD   Right PERRL 3 2 Round Brisk None   Left PERRL 3 2 Round Brisk None         Visual Fields       Left Right    Full Full         Extraocular Movement       Right Left    Full, Ortho Full, Ortho         Neuro/Psych     Oriented x3: Yes   Mood/Affect: Normal         Dilation     Both eyes: 2.5% Phenylephrine @ 8:02 AM           Slit Lamp and Fundus Exam     Slit Lamp Exam       Right Left   Lids/Lashes Dermatochalasis - upper lid Dermatochalasis - upper lid   Conjunctiva/Sclera White and quiet White and quiet   Cornea well healed cataract wound, 1+ Punctate epithelial erosions, trace tear film debris, focal endo pigment nasal paracentral trace tear film debris, well healed cataract wound, 1+ inferior Punctate epithelial erosions   Anterior Chamber deep and clear deep and clear   Iris Round and dilated Round and dilated   Lens PC IOL in good position PC IOL  in good position   Anterior Vitreous mild syneresis, Posterior vitreous detachment Vitreous syneresis         Fundus Exam  Right Left   Disc Pink and Sharp Pink and Sharp   C/D Ratio 0.2 0.1   Macula Flat, Blunted foveal reflex, central PEDs, RPE mottling and clumping, no heme Flat, Blunted foveal reflex, prominent central PEDs, RPE mottling and clumping, no heme or edema   Vessels attenuated, Tortuous, mild copper wiring mild attenuation, mild tortuosity   Periphery Attached, mild reticular degeneration, mild peripheral drusen, No heme Attached, mild reticular degeneration, mild peripheral drusen, No heme           Refraction     Wearing Rx       Sphere Cylinder Axis Add   Right -1.00 +1.50 015 +2.50   Left -1.50 +1.50 164 +2.50           IMAGING AND PROCEDURES  Imaging and Procedures for 02/08/2023  OCT, Retina - OU - Both Eyes       Right Eye Quality was good. Central Foveal Thickness: 330. Progression has been stable. Findings include normal foveal contour, no IRF, no SRF, retinal drusen , pigment epithelial detachment.   Left Eye Quality was good. Central Foveal Thickness: 317. Progression has been stable. Findings include no IRF, no SRF, abnormal foveal contour, retinal drusen , pigment epithelial detachment (Prominent central PEDs).   Notes *Images captured and stored on drive  Diagnosis / Impression:  Non-exu ARMD OU  Clinical management:  See below  Abbreviations: NFP - Normal foveal profile. CME - cystoid macular edema. PED - pigment epithelial detachment. IRF - intraretinal fluid. SRF - subretinal fluid. EZ - ellipsoid zone. ERM - epiretinal membrane. ORA - outer retinal atrophy. ORT - outer retinal tubulation. SRHM - subretinal hyper-reflective material. IRHM - intraretinal hyper-reflective material            ASSESSMENT/PLAN:   ICD-10-CM   1. Intermediate stage nonexudative age-related macular degeneration of both eyes  H35.3132 OCT,  Retina - OU - Both Eyes    2. Essential hypertension  I10     3. Hypertensive retinopathy of both eyes  H35.033     4. Pseudophakia, both eyes  Z96.1      Age related macular degeneration, non-exudative, both eyes - intermediate stage with +PED centrally -- no heme or exudative disease  - The incidence, anatomy, and pathology of dry AMD, risk of progression, and the AREDS and AREDS 2 study including smoking risks discussed with patient.  - BCVA OD 20/30 -- stable, OS 20/25 from 20/20  - Cont amsler grid monitoring  - f/u 6 months, DFE, OCT  2,3. Hypertensive retinopathy OU - discussed importance of tight BP control - monitor  4. Pseudophakia OU  - s/p CE/IOL  - IOL in good position, doing well  - monitor   Ophthalmic Meds Ordered this visit:  No orders of the defined types were placed in this encounter.    Return in about 6 months (around 08/08/2023) for f/u non-exu ARMD OU, DFE, OCT.  There are no Patient Instructions on file for this visit.  Explained the diagnoses, plan, and follow up with the patient and they expressed understanding.  Patient expressed understanding of the importance of proper follow up care.   This document serves as a record of services personally performed by Karie Chimera, MD, PhD. It was created on their behalf by Annalee Genta, COMT. The creation of this record is the provider's dictation and/or activities during the visit.  Electronically signed by: Annalee Genta, COMT 02/09/23 11:45 PM  This document serves as a record of services  personally performed by Karie Chimera, MD, PhD. It was created on their behalf by Glee Arvin. Manson Passey, OA an ophthalmic technician. The creation of this record is the provider's dictation and/or activities during the visit.    Electronically signed by: Glee Arvin. Manson Passey, OA 02/09/23 11:45 PM   Karie Chimera, M.D., Ph.D. Diseases & Surgery of the Retina and Vitreous [facility] 02/08/2023  I  have reviewed the above documentation for accuracy and completeness, and I agree with the above. Karie Chimera, M.D., Ph.D. 02/09/23 11:48 PM  Abbreviations: M myopia (nearsighted); A astigmatism; H hyperopia (farsighted); P presbyopia; Mrx spectacle prescription;  CTL contact lenses; OD right eye; OS left eye; OU both eyes  XT exotropia; ET esotropia; PEK punctate epithelial keratitis; PEE punctate epithelial erosions; DES dry eye syndrome; MGD meibomian gland dysfunction; ATs artificial tears; PFAT's preservative free artificial tears; NSC nuclear sclerotic cataract; PSC posterior subcapsular cataract; ERM epi-retinal membrane; PVD posterior vitreous detachment; RD retinal detachment; DM diabetes mellitus; DR diabetic retinopathy; NPDR non-proliferative diabetic retinopathy; PDR proliferative diabetic retinopathy; CSME clinically significant macular edema; DME diabetic macular edema; dbh dot blot hemorrhages; CWS cotton wool spot; POAG primary open angle glaucoma; C/D cup-to-disc ratio; HVF humphrey visual field; GVF goldmann visual field; OCT optical coherence tomography; IOP intraocular pressure; BRVO Branch retinal vein occlusion; CRVO central retinal vein occlusion; CRAO central retinal artery occlusion; BRAO branch retinal artery occlusion; RT retinal tear; SB scleral buckle; PPV pars plana vitrectomy; VH Vitreous hemorrhage; PRP panretinal laser photocoagulation; IVK intravitreal kenalog; VMT vitreomacular traction; MH Macular hole;  NVD neovascularization of the disc; NVE neovascularization elsewhere; AREDS age related eye disease study; ARMD age related macular degeneration; POAG primary open angle glaucoma; EBMD epithelial/anterior basement membrane dystrophy; ACIOL anterior chamber intraocular lens; IOL intraocular lens; PCIOL posterior chamber intraocular lens; Phaco/IOL phacoemulsification with intraocular lens placement; PRK photorefractive keratectomy; LASIK laser assisted in situ  keratomileusis; HTN hypertension; DM diabetes mellitus; COPD chronic obstructive pulmonary disease

## 2023-02-08 ENCOUNTER — Encounter (INDEPENDENT_AMBULATORY_CARE_PROVIDER_SITE_OTHER): Payer: Self-pay | Admitting: Ophthalmology

## 2023-02-08 ENCOUNTER — Ambulatory Visit (INDEPENDENT_AMBULATORY_CARE_PROVIDER_SITE_OTHER): Payer: Medicare Other | Admitting: Ophthalmology

## 2023-02-08 DIAGNOSIS — H35033 Hypertensive retinopathy, bilateral: Secondary | ICD-10-CM

## 2023-02-08 DIAGNOSIS — Z961 Presence of intraocular lens: Secondary | ICD-10-CM

## 2023-02-08 DIAGNOSIS — H353132 Nonexudative age-related macular degeneration, bilateral, intermediate dry stage: Secondary | ICD-10-CM | POA: Diagnosis not present

## 2023-02-08 DIAGNOSIS — I10 Essential (primary) hypertension: Secondary | ICD-10-CM

## 2023-02-18 DIAGNOSIS — J329 Chronic sinusitis, unspecified: Secondary | ICD-10-CM | POA: Diagnosis not present

## 2023-02-18 DIAGNOSIS — H9209 Otalgia, unspecified ear: Secondary | ICD-10-CM | POA: Diagnosis not present

## 2023-02-18 DIAGNOSIS — H9202 Otalgia, left ear: Secondary | ICD-10-CM | POA: Diagnosis not present

## 2023-02-18 DIAGNOSIS — G4700 Insomnia, unspecified: Secondary | ICD-10-CM | POA: Diagnosis not present

## 2023-03-22 ENCOUNTER — Other Ambulatory Visit: Payer: Self-pay

## 2023-03-22 ENCOUNTER — Encounter: Payer: Self-pay | Admitting: Internal Medicine

## 2023-03-22 ENCOUNTER — Ambulatory Visit: Payer: Medicare Other | Admitting: Internal Medicine

## 2023-03-22 VITALS — BP 120/72 | HR 96 | Temp 97.6°F | Ht 63.39 in | Wt 191.2 lb

## 2023-03-22 DIAGNOSIS — K219 Gastro-esophageal reflux disease without esophagitis: Secondary | ICD-10-CM | POA: Diagnosis not present

## 2023-03-22 DIAGNOSIS — J3089 Other allergic rhinitis: Secondary | ICD-10-CM | POA: Diagnosis not present

## 2023-03-22 MED ORDER — MONTELUKAST SODIUM 10 MG PO TABS: 10.0000 mg | ORAL_TABLET | Freq: Every day | ORAL | 5 refills | Status: DC

## 2023-03-22 MED ORDER — FLUTICASONE PROPIONATE 50 MCG/ACT NA SUSP: 2.0000 | Freq: Every day | NASAL | 5 refills | Status: DC

## 2023-03-22 NOTE — Patient Instructions (Addendum)
Other Allergic Rhinitis: - Use nasal saline rinses before nose sprays such as with Neilmed Sinus Rinse.  Use distilled water.   - Use Flonase 2 sprays each nostril daily. Aim upward and outward. - Use Singulair 10mg  daily.  Stop if there are any mood/behavioral changes.  GERD - On Prevacid 30mg  daily. Remember to take this on an empty stomach in the morning.  -Avoid lying down for at least two hours after a meal or after drinking acidic beverages, like soda, or other caffeinated beverages. This can help to prevent stomach contents from flowing back into the esophagus. -Keep your head elevated while you sleep. Using an extra pillow or two can also help to prevent reflux. -Eat smaller and more frequent meals each day instead of a few large meals. This promotes digestion and can aid in preventing heartburn. -Wear loose-fitting clothes to ease pressure on the stomach, which can worsen heartburn and reflux. -Reduce excess weight around the midsection. This can ease pressure on the stomach. Such pressure can force some stomach contents back up the esophagus.    Follow up: skin testing 10/28 at 9 AM.

## 2023-03-22 NOTE — Progress Notes (Signed)
NEW PATIENT  Date of Service/Encounter:  03/22/23  Consult requested by: Leone Payor, FNP   Subjective:   [name] (DOB: March 21, 1946) is a 77 y.o. female who presents to the clinic on 03/22/2023 with a chief complaint of congestion, drainage, runny nose.  Marland Kitchen    History obtained from: chart review and patient.     Rhinitis:  Started since she was born.  Symptoms include: nasal congestion, rhinorrhea, and post nasal drainage headaches, ear pain.  Seen Dr Suszanne Conners about 5 years ago and had a scope that was normal.   Occurs year-round Potential triggers: not sure, mowing grasses, pollution, strong scents   Treatments tried:  Singulair; denies any mood changes  Tried Claritin in the past, minimal relief Does not like nose sprays much  Air purifier  Previous allergy testing: no History of sinus surgery: no   GERD: On Prevacid. Followed by GI. Well controlled.    Past Medical History: Past Medical History:  Diagnosis Date   Anxiety    Arthritis    "all over; mostly knees and shoulders"  (06/04/2015)    Family history of adverse reaction to anesthesia    Patients mother had bad N/V after anesthesia   GERD (gastroesophageal reflux disease)    High cholesterol    Hypertension    Primary localized osteoarthritis of left knee    Past Surgical History: Past Surgical History:  Procedure Laterality Date   APPENDECTOMY     CHOLECYSTECTOMY OPEN     pain   COLONOSCOPY N/A 04/22/2016   Procedure: COLONOSCOPY;  Surgeon: Malissa Hippo, MD;  [facility];  Service: Endoscopy;  Laterality: N/A;  1200   COLONOSCOPY W/ POLYPECTOMY     COLONOSCOPY WITH PROPOFOL N/A 10/31/2021   Procedure: COLONOSCOPY WITH PROPOFOL;  Surgeon: Dolores Frame, MD;  [facility];  Service: Gastroenterology;  Laterality: N/A;  935   ESOPHAGOGASTRODUODENOSCOPY N/A 09/08/2017   Procedure: ESOPHAGOGASTRODUODENOSCOPY (EGD) WITH ESOPHAGEAL DILATION;  Surgeon: Malissa Hippo,  MD;  [facility];  Service: Endoscopy;  Laterality: N/A;  3:00   JOINT REPLACEMENT     KNEE ARTHROSCOPY W/ MENISCECTOMY Left    KNEE SURGERY Left 1990s   MASS EXCISION N/A 07/27/2016   Procedure: EXCISION OF SEBACEOUS CYST ON NECK;  Surgeon: Newman Pies, MD;  Location: [facility];  Service: ENT;  Laterality: N/A;   OOPHORECTOMY     for a cyst ? side   POLYPECTOMY  10/31/2021   Procedure: POLYPECTOMY;  Surgeon: Dolores Frame, MD;  [facility];  Service: Gastroenterology;;   TOTAL KNEE ARTHROPLASTY Left 06/03/2015   TOTAL KNEE ARTHROPLASTY Left 06/03/2015   Procedure: TOTAL LEFT KNEE ARTHROPLASTY;  Surgeon: Salvatore Marvel, MD;  Location: [facility];  Service: Orthopedics;  Laterality: Left;   TUBAL LIGATION      Family History: Family History  Problem Relation Age of Onset   Heart disease Mother    Diabetes Mellitus II Mother    Kidney disease Mother    Cancer Mother    Hypertension Father    Cancer Father    Hypertension Brother    Hypertension Sister    Colon cancer Neg Hx     Social History:  Flooring in bedroom: wood Pets: none  Tobacco use/exposure: none Job: retired   Medication List:  Allergies as of 03/22/2023       Reactions   Cefzil [cefprozil] Rash        Medication List  Accurate as of March 22, 2023  9:45 AM. If you have any questions, ask your nurse or doctor.          gabapentin 300 MG capsule Commonly known as: NEURONTIN Take 300 mg by mouth at bedtime.   hydrochlorothiazide 12.5 MG capsule Commonly known as: MICROZIDE Take 12.5 mg by mouth in the morning.   lansoprazole 30 MG capsule Commonly known as: PREVACID Take 30 mg by mouth every morning.   losartan 50 MG tablet Commonly known as: COZAAR Take 50 mg by mouth 2 (two) times daily.   montelukast 10 MG tablet Commonly known as: SINGULAIR Take 10 mg by mouth at bedtime.   PreserVision AREDS 2 Caps Take 1 capsule by mouth 2 (two) times  daily.   rosuvastatin 5 MG tablet Commonly known as: CRESTOR Take 5 mg by mouth every evening.   sucralfate 1 g tablet Commonly known as: CARAFATE Take 1 g by mouth in the morning.   Vitamin D-3 125 MCG (5000 UT) Tabs Take 5,000 Units by mouth in the morning.   zolpidem 10 MG tablet Commonly known as: AMBIEN Take 10 mg by mouth at bedtime.         REVIEW OF SYSTEMS: Pertinent positives and negatives discussed in HPI.   Objective:   Physical Exam: BP 120/72   Pulse 96   Temp 97.6 F (36.4 C)   Ht 5' 3.39" (1.61 m)   Wt 191 lb 3.2 oz (86.7 kg)   SpO2 100%   BMI 33.46 kg/m  Body mass index is 33.46 kg/m. GEN: alert, well developed HEENT: clear conjunctiva, TM grey and translucent, nose with + mild inferior turbinate hypertrophy, pink nasal mucosa, + clear rhinorrhea, no cobblestoning HEART: regular rate and rhythm, no murmur LUNGS: clear to auscultation bilaterally, no coughing, unlabored respiration ABDOMEN: soft, non distended  SKIN: no rashes or lesions  Reviewed:  99/2024: followed by Optho for macular degeneration, HTN, retinopathy of bl eyes. Discussed better BP control.   09/08/2022: seen by GI for GERD without esophagitis. On Prevacid 30mg  daily. Planing for colonoscopy in 2025.    02/18/2023: seen by Jimmye Norman NP for chronic headaches, ear aches, congestion, drainage.  On Singulair/Claritin. Seen ENT in the past who report normal imaging.  Discussed treating for possible bacterial infection but informed her that this is likely not the issue.    EGD 09/08/2017: normal esophagus, 3cm hiatal hernia.  Normal stomach. Normal duodenum.  Performed dilation due to dysphagia but EGD was normal.  Assessment:   1. Gastroesophageal reflux disease without esophagitis   2. Other allergic rhinitis     Plan/Recommendations:  Other Allergic Rhinitis: - Due to turbinate hypertrophy and unresponsive to over the counter meds, will perform skin testing to identify aeroallergen  triggers.   - Use nasal saline rinses before nose sprays such as with Neilmed Sinus Rinse.  Use distilled water.   - Use Flonase 2 sprays each nostril daily. Aim upward and outward. - Use Singulair 10mg  daily.  Stop if there are any mood/behavioral changes.  GERD - On Prevacid 30mg  daily.  -Avoid lying down for at least two hours after a meal or after drinking acidic beverages, like soda, or other caffeinated beverages. This can help to prevent stomach contents from flowing back into the esophagus. -Keep your head elevated while you sleep. Using an extra pillow or two can also help to prevent reflux. -Eat smaller and more frequent meals each day instead of a few large meals. This promotes digestion  and can aid in preventing heartburn. -Wear loose-fitting clothes to ease pressure on the stomach, which can worsen heartburn and reflux. -Reduce excess weight around the midsection. This can ease pressure on the stomach. Such pressure can force some stomach contents back up the esophagus.    Follow up: skin testing 10/28 at 9 AM.     No follow-ups on file.  Alesia Morin, MD [facility]

## 2023-03-29 ENCOUNTER — Ambulatory Visit: Payer: Medicare Other | Admitting: Internal Medicine

## 2023-03-29 VITALS — Temp 98.7°F

## 2023-03-29 DIAGNOSIS — J310 Chronic rhinitis: Secondary | ICD-10-CM

## 2023-03-29 DIAGNOSIS — Z23 Encounter for immunization: Secondary | ICD-10-CM | POA: Diagnosis not present

## 2023-03-29 MED ORDER — AZELASTINE HCL 0.1 % NA SOLN: 1.0000 | Freq: Two times a day (BID) | NASAL | 5 refills | Status: DC | PRN

## 2023-03-29 NOTE — Progress Notes (Signed)
FOLLOW UP Date of Service/Encounter:  03/29/23   Subjective:  [name] (DOB: 27-Sep-1945) is a 77 y.o. female who [facility] for follow up for skin testing.   History obtained from: chart review and patient.  Anti histamines held. Not sick   Past Medical History: Past Medical History:  Diagnosis Date   Anxiety    Arthritis    "all over; mostly knees and shoulders"  (06/04/2015)    Family history of adverse reaction to anesthesia    Patients mother had bad N/V after anesthesia   GERD (gastroesophageal reflux disease)    High cholesterol    Hypertension    Primary localized osteoarthritis of left knee     Objective:  Temp 98.7 F (37.1 C) (Temporal)  There is no height or weight on file to calculate BMI. Physical Exam: GEN: alert, well developed HEENT: clear conjunctiva, MMM LUNGS: no coughing, unlabored respiration SKIN: no rashes or lesions  Skin Testing:  Skin prick testing was placed, which includes aeroallergens/foods, histamine control, and saline control.  Verbal consent was obtained prior to placing test.  Patient tolerated procedure well.  Allergy testing results were read and interpreted by myself, documented by clinical staff. Adequate positive and negative control.  Positive results to:  Results discussed with patient/family.  Airborne Adult Perc - 03/29/23 0850     Time Antigen Placed 0850    Allergen Manufacturer Waynette Buttery    Location Back    Number of Test 55    1. Control-Buffer 50% Glycerol Negative    2. Control-Histamine 3+    3. Bahia Negative    4. French Southern Territories Negative    5. Johnson Negative    6. Kentucky Blue Negative    7. Meadow Fescue Negative    8. Perennial Rye Negative    9. Timothy Negative    10. Ragweed Mix Negative    11. Cocklebur Negative    12. Plantain,  English Negative    13. Baccharis Negative    14. Dog Fennel Negative    15. Russian Thistle Negative    16. Lamb's Quarters  Negative    17. Sheep Sorrell Negative    18. Rough Pigweed Negative    19. Marsh Elder, Rough Negative    20. Mugwort, Common Negative    21. Box, Elder Negative    22. Cedar, red Negative    23. Sweet Gum Negative    24. Pecan Pollen Negative    25. Pine Mix Negative    26. Walnut, Black Pollen Negative    27. Red Mulberry Negative    28. Ash Mix Negative    29. Birch Mix Negative    30. Beech American Negative    31. Cottonwood, Guinea-Bissau Negative    32. Hickory, White Negative    33. Maple Mix Negative    34. Oak, Guinea-[place]issau Mix Negative    35. Sycamore Eastern Negative    36. Alternaria Alternata Negative    37. Cladosporium Herbarum Negative    38. Aspergillus Mix Negative    39. Penicillium Mix Negative    40. Bipolaris Sorokiniana (Helminthosporium) Negative    41. Drechslera Spicifera (Curvularia) Negative    42. Mucor Plumbeus Negative    43. Fusarium Moniliforme Negative    44. Aureobasidium Pullulans (pullulara) Negative    45. Rhizopus Oryzae Negative    46. Botrytis Cinera Negative    47. Epicoccum Nigrum Negative    48. Phoma Betae Negative  49. Dust Mite Mix Negative    50. Cat Hair 10,000 BAU/ml Negative    51.  Dog Epithelia Negative    52. Mixed Feathers Negative    53. Horse Epithelia Negative    54. Cockroach, German Negative    55. Tobacco Leaf Negative             Intradermal - 03/29/23 0928     Time Antigen Placed 4098    Allergen Manufacturer Waynette Buttery    Location Arm    Number of Test 16    Control Negative    Bahia Negative    French Southern Territories Negative    Johnson Negative    7 Grass Negative    Ragweed Mix Negative    Weed Mix Negative    Tree Mix Negative    Mold 1 Negative    Mold 2 Negative    Mold 3 Negative    Mold 4 Negative    Mite Mix Negative    Cat Negative    Dog Negative    Cockroach Negative              Assessment:   1. Chronic rhinitis     Plan/Recommendations:  Chronic Rhinitis: - Due to turbinate  hypertrophy, significant symptoms, and unresponsive to over the counter meds + Singulair, performed skin testing to identify aeroallergen triggers.   - Positive skin test 03/2023: none - Use Flonase 2 sprays each nostril daily. Aim upward and outward. Can also try Nasonex or Nasacort if she does not tolerate Flonase.  - Use Azelastine 1-2 sprays each nostril twice daily as needed for runny nose, drainage, sneezing, congestion. Aim upward and outward. - Hold Singulair as this is not allergic.   GERD - On Prevacid 30mg  daily. Remember to take this on an empty stomach in the morning.  -Avoid lying down for at least two hours after a meal or after drinking acidic beverages, like soda, or other caffeinated beverages. This can help to prevent stomach contents from flowing back into the esophagus. -Keep your head elevated while you sleep. Using an extra pillow or two can also help to prevent reflux. -Eat smaller and more frequent meals each day instead of a few large meals. This promotes digestion and can aid in preventing heartburn. -Wear loose-fitting clothes to ease pressure on the stomach, which can worsen heartburn and reflux. -Reduce excess weight around the midsection. This can ease pressure on the stomach. Such pressure can force some stomach contents back up the esophagus.    Follow up: skin testing 10/28 at 9 AM.     Return in about 6 weeks (around 05/10/2023).  Alesia Morin, MD [facility]

## 2023-03-29 NOTE — Patient Instructions (Addendum)
Chronic Rhinitis: - Positive skin test 03/2023: none - Use nasal saline rinses before nose sprays such as with Neilmed Sinus Rinse.  Use distilled water.   - Use Flonase 2 sprays each nostril daily. Aim upward and outward. Can also try Nasonex or Nasacort if she does not tolerate Flonase.  - Use Azelastine 1-2 sprays each nostril twice daily as needed for runny nose, drainage, sneezing, congestion. Aim upward and outward. - Hold Singulair as this is not allergic.  GERD - On Prevacid 30mg  daily. Remember to take this on an empty stomach in the morning.  -Avoid lying down for at least two hours after a meal or after drinking acidic beverages, like soda, or other caffeinated beverages. This can help to prevent stomach contents from flowing back into the esophagus. -Keep your head elevated while you sleep. Using an extra pillow or two can also help to prevent reflux. -Eat smaller and more frequent meals each day instead of a few large meals. This promotes digestion and can aid in preventing heartburn. -Wear loose-fitting clothes to ease pressure on the stomach, which can worsen heartburn and reflux. -Reduce excess weight around the midsection. This can ease pressure on the stomach. Such pressure can force some stomach contents back up the esophagus.

## 2023-04-13 DIAGNOSIS — M25561 Pain in right knee: Secondary | ICD-10-CM | POA: Diagnosis not present

## 2023-05-24 ENCOUNTER — Ambulatory Visit: Payer: Medicare Other | Admitting: Internal Medicine

## 2023-06-01 ENCOUNTER — Encounter: Payer: Self-pay | Admitting: Family Medicine

## 2023-06-01 ENCOUNTER — Ambulatory Visit (INDEPENDENT_AMBULATORY_CARE_PROVIDER_SITE_OTHER): Payer: Medicare Other | Admitting: Family Medicine

## 2023-06-01 VITALS — BP 140/100 | HR 80 | Temp 98.5°F | Ht 63.39 in | Wt 193.5 lb

## 2023-06-01 DIAGNOSIS — E782 Mixed hyperlipidemia: Secondary | ICD-10-CM | POA: Insufficient documentation

## 2023-06-01 DIAGNOSIS — Z7689 Persons encountering health services in other specified circumstances: Secondary | ICD-10-CM | POA: Insufficient documentation

## 2023-06-01 DIAGNOSIS — I10 Essential (primary) hypertension: Secondary | ICD-10-CM | POA: Diagnosis not present

## 2023-06-01 DIAGNOSIS — K219 Gastro-esophageal reflux disease without esophagitis: Secondary | ICD-10-CM

## 2023-06-01 MED ORDER — HYDROCHLOROTHIAZIDE 12.5 MG PO CAPS: 12.5000 mg | ORAL_CAPSULE | Freq: Every day | ORAL | 0 refills | Status: DC

## 2023-06-01 NOTE — Assessment & Plan Note (Signed)
 Today your medical history, current concerns, and health maintenance items were discussed. I have received your medical records and reviewed them. Follow up with me in 2-4 week for blood pressure. Last physical with normal labs 01/18/2023. Will obtain labs at upcoming visit.

## 2023-06-01 NOTE — Assessment & Plan Note (Signed)
 Well controlled on current regimen. She alternates between Lansoprazole and Dexlansoprazole  due to cost.  Anti-reflux measures such as raising the head of the bed, avoiding tight clothing or belts, avoiding eating late at night and not lying down shortly after mealtime and achieving weight loss are discussed. Avoid ASA, NSAID's, caffeine, peppermints, alcohol and tobacco. She should alert me if there are persistent symptoms, dysphagia, weight loss or GI bleeding. Follow up as needed.

## 2023-06-01 NOTE — Patient Instructions (Signed)
 It was great to meet you today and I'm excited to have you join the [facility] practice. I hope you had a positive experience today! If you feel so inclined, please feel free to recommend our practice to friends and family. Jeoffrey Barrio, FNP-C

## 2023-06-01 NOTE — Assessment & Plan Note (Signed)
 Continue Rosuvastatin .  I recommend consuming a heart healthy diet such as Mediterranean diet or DASH diet with whole grains, fruits, vegetable, fish, lean meats, nuts, and olive oil. Limit sweets and processed foods. I also encourage moderate intensity exercise 150 minutes weekly. This is 3-5 times weekly for 30-50 minutes each session. Goal should be pace of 3 miles/hours, or walking 1.5 miles in 30 minutes. The ASCVD Risk score (Arnett DK, et al., 2019) failed to calculate for the following reasons:   Cannot find a previous HDL lab

## 2023-06-01 NOTE — Progress Notes (Signed)
 New Patient Office Visit  Subjective    Patient ID: [name], female    DOB: 1946/02/18  Age: 77 y.o. MRN: [id number]  CC:  Chief Complaint  Patient presents with   Establish Care    HPI [name] presents to establish care. Oriented to practice routines and expectations. Has been seeing PCP regularly. Most recent visit with labs 2 months ago. PMH includes arthritis, GERD, HTN, HLD and she is followed by a retina specialist, orthopedist, and gastroenterology. She does request a refill on her hydrochlorothiazide  today as she has been out for several days.   Breast CA screening: Mammogram status: No longer required due to age, normal mammograms in past. Cervical CA screening: was normal Colon CA screening: colonoscopy 2 years ago with abnormalities. Polyps, repeat 2025. DEXA: DEXA scan, DEXA scan ordered to evaluate the patient for osteoporosis. Tobacco: non-smoker Vaccines:  4x covid vaccines, 2x shingles, flu this year, PNA x2, Tdap unsure  HYPERTENSION / HYPERLIPIDEMIA Satisfied with current treatment? yes Duration of hypertension: chronic BP monitoring frequency: rarely BP range: 140/78 or lower BP medication side effects: no Past BP meds: HCTZ and losartan  (cozaar ) Duration of hyperlipidemia: chronic Cholesterol medication side effects: no Cholesterol supplements: none Past cholesterol medications: rosuvastatin  (crestor ) Medication compliance: excellent compliance Aspirin: no Recent stressors: no Recurrent headaches: no Visual changes: no Palpitations: no Dyspnea: no Chest pain: no Lower extremity edema: no Dizzy/lightheaded: no    Outpatient Encounter Medications as of 06/01/2023  Medication Sig   ALPRAZolam  (XANAX ) 0.5 MG tablet Take 0.5 mg by mouth at bedtime as needed for anxiety.   Cholecalciferol (VITAMIN D -3) 5000 units TABS Take 5,000 Units by mouth in the morning.   Dexlansoprazole  30 MG capsule DR Take 30 mg by mouth daily.   diclofenac (VOLTAREN) 75  MG EC tablet Take 75 mg by mouth 2 (two) times daily.   gabapentin (NEURONTIN) 300 MG capsule Take 300 mg by mouth at bedtime.   lansoprazole (PREVACID) 30 MG capsule Take 30 mg by mouth every morning.   losartan  (COZAAR ) 50 MG tablet Take 50 mg by mouth 2 (two) times daily.   Multiple Vitamins-Minerals (PRESERVISION AREDS 2) CAPS Take 1 capsule by mouth 2 (two) times daily.   rosuvastatin  (CRESTOR ) 5 MG tablet Take 5 mg by mouth every evening.   sucralfate  (CARAFATE ) 1 G tablet Take 1 g by mouth in the morning.   [DISCONTINUED] hydrochlorothiazide  (MICROZIDE ) 12.5 MG capsule Take 12.5 mg by mouth in the morning.   azelastine  (ASTELIN ) 0.1 % nasal spray Place 1 spray into both nostrils 2 (two) times daily as needed. Use in each nostril as directed   fluticasone  (FLONASE ) 50 MCG/ACT nasal spray Place 2 sprays into both nostrils daily.   hydrochlorothiazide  (MICROZIDE ) 12.5 MG capsule Take 1 capsule (12.5 mg total) by mouth daily.   montelukast  (SINGULAIR ) 10 MG tablet Take 1 tablet (10 mg total) by mouth at bedtime. (Patient not taking: Reported on 06/01/2023)   zolpidem  (AMBIEN ) 10 MG tablet Take 10 mg by mouth at bedtime.   No facility-administered encounter medications on file as of 06/01/2023.    Past Medical History:  Diagnosis Date   Anxiety    Arthritis    all over; mostly knees and shoulders  (06/04/2015)    Cataract    Family history of adverse reaction to anesthesia    Patients mother had bad N/V after anesthesia   GERD (gastroesophageal reflux disease)    High cholesterol    Hypertension  Primary localized osteoarthritis of left knee     Past Surgical History:  Procedure Laterality Date   APPENDECTOMY     CHOLECYSTECTOMY OPEN     pain   COLONOSCOPY N/A 04/22/2016   Procedure: COLONOSCOPY;  Surgeon: Claudis RAYMOND Rivet, MD;  [facility];  Service: Endoscopy;  Laterality: N/A;  1200   COLONOSCOPY W/ POLYPECTOMY     COLONOSCOPY WITH PROPOFOL  N/A 10/31/2021    Procedure: COLONOSCOPY WITH PROPOFOL ;  Surgeon: Eartha Angelia Sieving, MD;  [facility]: [facility];  Service: Gastroenterology;  Laterality: N/A;  935   ESOPHAGOGASTRODUODENOSCOPY N/A 09/08/2017   Procedure: ESOPHAGOGASTRODUODENOSCOPY (EGD) WITH ESOPHAGEAL DILATION;  Surgeon: Rivet Claudis RAYMOND, MD;  [facility];  Service: Endoscopy;  Laterality: N/A;  3:00   JOINT REPLACEMENT     KNEE ARTHROSCOPY W/ MENISCECTOMY Left    KNEE SURGERY Left 1990s   MASS EXCISION N/A 07/27/2016   Procedure: EXCISION OF SEBACEOUS CYST ON NECK;  Surgeon: Daniel Moccasin, MD;  Location: [facility];  Service: ENT;  Laterality: N/A;   OOPHORECTOMY     for a cyst ? 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   Procedure: TOTAL LEFT KNEE ARTHROPLASTY;  Surgeon: Lamar Millman, MD;  Location: [facility];  Service: Orthopedics;  Laterality: Left;   TUBAL LIGATION      Family History  Problem Relation Age of Onset   Heart disease Mother    Diabetes Mellitus II Mother    Kidney disease Mother    Cancer Mother    Hypertension Father    Cancer Father    Hypertension Sister    Hypertension Brother    Colon cancer Neg Hx     Social History   Socioeconomic History   Marital status: Married    Spouse name: hydrologist   Number of children: 6   Years of education: Not on file   Highest education level: 10th grade  Occupational History   Not on file  Tobacco Use   Smoking status: Never   Smokeless tobacco: Never  Vaping Use   Vaping status: Never Used  Substance and Sexual Activity   Alcohol use: No    Alcohol/week: 0.0 standard drinks of alcohol   Drug use: No   Sexual activity: Not Currently  Other Topics Concern   Not on file  Social History Narrative   Lives with husband, [name].  [phone] cell    Social Drivers of Health   Financial Resource Strain: Low Risk  (05/25/2023)   Overall Financial Resource Strain (CARDIA)    Difficulty of Paying Living Expenses: Not hard at all  Food Insecurity: No Food Insecurity (05/25/2023)   Hunger Vital Sign    Worried About Running Out of Food in the Last Year: Never true    Ran Out of Food in the Last Year: Never true  Transportation Needs: No Transportation Needs (05/25/2023)   PRAPARE - Administrator, Civil Service (Medical): No    Lack of Transportation (Non-Medical): No  Physical Activity: Insufficiently Active (05/25/2023)   Exercise Vital Sign    Days of Exercise per Week: 1 day    Minutes of Exercise per Session: 20 min  Stress: Stress Concern Present (05/25/2023)   [employer] of Occupational Health - Occupational Stress Questionnaire  Feeling of Stress : To some extent  Social Connections: Moderately Integrated (05/25/2023)   Social Connection and Isolation Panel [NHANES]    Frequency of Communication with Friends and Family: More than three times a week    Frequency of Social Gatherings with Friends and Family: Twice a week    Attends Religious Services: More than 4 times per year    Active Member of [employer] or Organizations: No    Attends Engineer, Structural: Not on file    Marital Status: Married  Intimate Partner Violence: Unknown (09/02/2021)   Received from [employer], [facility]   HITS    Physically Hurt: Not on file    Insult or Talk Down To: Not on file    Threaten Physical Harm: Not on file    Scream or Curse: Not on file    Review of Systems  All other systems reviewed and are negative.       Objective    BP (!) 140/100 (BP Location: Left Arm)   Pulse 80   Temp 98.5 F (36.9 C)   Ht 5' 3.39 (1.61 m)   Wt 193 lb 8 oz (87.8 kg)   SpO2 99%   BMI 33.86 kg/m   Physical Exam Vitals and nursing note reviewed.  Constitutional:      Appearance: Normal appearance. She  is normal weight.  HENT:     Head: Normocephalic and atraumatic.  Cardiovascular:     Rate and Rhythm: Normal rate and regular rhythm.     Pulses: Normal pulses.     Heart sounds: Murmur heard.  Pulmonary:     Effort: Pulmonary effort is normal.     Breath sounds: Normal breath sounds.  Skin:    General: Skin is warm and dry.  Neurological:     General: No focal deficit present.     Mental Status: She is alert and oriented to person, place, and time. Mental status is at baseline.  Psychiatric:        Mood and Affect: Mood normal.        Behavior: Behavior normal.        Thought Content: Thought content normal.        Judgment: Judgment normal.         Assessment & Plan:   Problem List Items Addressed This Visit     Gastroesophageal reflux disease without esophagitis   Well controlled on current regimen. She alternates between Lansoprazole and Dexlansoprazole  due to cost.  Anti-reflux measures such as raising the head of the bed, avoiding tight clothing or belts, avoiding eating late at night and not lying down shortly after mealtime and achieving weight loss are discussed. Avoid ASA, NSAID's, caffeine, peppermints, alcohol and tobacco. She should alert me if there are persistent symptoms, dysphagia, weight loss or GI bleeding. Follow up as needed.      Relevant Medications   Dexlansoprazole  30 MG capsule DR   Hypertension   Chronic uncontrolled as she is out of hydrochlorothiazide . Refill send and will follow up in 2-4 weeks. Continue to monitor at home and seek medical care if sustains >180/100. Recommend heart healthy diet such as Mediterranean diet with whole grains, fruits, vegetable, fish, lean meats, nuts, and olive oil. Limit salt. Encouraged moderate walking, 3-5 times/week for 30-50 minutes each session. Aim for at least 150 minutes.week. Goal should be pace of 3 miles/hours, or walking 1.5 miles in 30 minutes. Avoid tobacco products. Avoid excess alcohol. Take  medications as prescribed and  bring medications and blood pressure log with cuff to each office visit. Seek medical care for chest pain, palpitations, shortness of breath with exertion, dizziness/lightheadedness, vision changes, recurrent headaches, or swelling of extremities.       Relevant Medications   hydrochlorothiazide  (MICROZIDE ) 12.5 MG capsule   Mixed hyperlipidemia   Continue Rosuvastatin .  I recommend consuming a heart healthy diet such as Mediterranean diet or DASH diet with whole grains, fruits, vegetable, fish, lean meats, nuts, and olive oil. Limit sweets and processed foods. I also encourage moderate intensity exercise 150 minutes weekly. This is 3-5 times weekly for 30-50 minutes each session. Goal should be pace of 3 miles/hours, or walking 1.5 miles in 30 minutes. The ASCVD Risk score (Arnett DK, et al., 2019) failed to calculate for the following reasons:   Cannot find a previous HDL lab       Relevant Medications   hydrochlorothiazide  (MICROZIDE ) 12.5 MG capsule   Encounter to establish care with new doctor - Primary   Today your medical history, current concerns, and health maintenance items were discussed. I have received your medical records and reviewed them. Follow up with me in 2-4 week for blood pressure. Last physical with normal labs 01/18/2023. Will obtain labs at upcoming visit.        Return in about 4 weeks (around 06/29/2023) for hypertension.   Jeoffrey GORMAN Barrio, FNP

## 2023-06-01 NOTE — Assessment & Plan Note (Signed)
 Chronic uncontrolled as she is out of hydrochlorothiazide . Refill send and will follow up in 2-4 weeks. Continue to monitor at home and seek medical care if sustains >180/100. Recommend heart healthy diet such as Mediterranean diet with whole grains, fruits, vegetable, fish, lean meats, nuts, and olive oil. Limit salt. Encouraged moderate walking, 3-5 times/week for 30-50 minutes each session. Aim for at least 150 minutes.week. Goal should be pace of 3 miles/hours, or walking 1.5 miles in 30 minutes. Avoid tobacco products. Avoid excess alcohol. Take medications as prescribed and bring medications and blood pressure log with cuff to each office visit. Seek medical care for chest pain, palpitations, shortness of breath with exertion, dizziness/lightheadedness, vision changes, recurrent headaches, or swelling of extremities.

## 2023-06-29 ENCOUNTER — Encounter: Payer: Self-pay | Admitting: Family Medicine

## 2023-06-29 ENCOUNTER — Other Ambulatory Visit: Payer: Self-pay | Admitting: Family Medicine

## 2023-06-29 ENCOUNTER — Telehealth: Payer: Self-pay

## 2023-06-29 ENCOUNTER — Ambulatory Visit (INDEPENDENT_AMBULATORY_CARE_PROVIDER_SITE_OTHER): Payer: Medicare Other | Admitting: Family Medicine

## 2023-06-29 VITALS — BP 148/74 | HR 71 | Temp 98.6°F | Ht 63.0 in | Wt 190.0 lb

## 2023-06-29 DIAGNOSIS — E782 Mixed hyperlipidemia: Secondary | ICD-10-CM

## 2023-06-29 DIAGNOSIS — I10 Essential (primary) hypertension: Secondary | ICD-10-CM | POA: Diagnosis not present

## 2023-06-29 MED ORDER — HYDROCHLOROTHIAZIDE 25 MG PO TABS: 25.0000 mg | ORAL_TABLET | Freq: Every day | ORAL | 1 refills | Status: DC

## 2023-06-29 MED ORDER — TRAZODONE HCL 50 MG PO TABS: 25.0000 mg | ORAL_TABLET | Freq: Every day | ORAL | 1 refills | Status: DC

## 2023-06-29 NOTE — Progress Notes (Signed)
Subjective:  HPI: [name] is a 78 y.o. female presenting on 06/29/2023 for Follow-up (Return in about 4 weeks (around 06/29/2023) for hypertension/)   HPI Patient is in today for HTN follow-up. Ms Gallardo has resumed her hydrochlorothiazide.  HYPERTENSION without Chronic Kidney Disease Hypertension status: uncontrolled  Satisfied with current treatment? yes Duration of hypertension: chronic BP monitoring frequency:  a few times a week BP range: 138/70s to 150s SBP BP medication side effects:  no Medication compliance: excellent compliance Previous BP meds:HCTZ and losartan (cozaar) Aspirin: no Recurrent headaches: no Visual changes: no Palpitations: no Dyspnea: no Chest pain: no Lower extremity edema: no Dizzy/lightheaded: no   Review of Systems  All other systems reviewed and are negative.   Relevant past medical history reviewed and updated as indicated.   Past Medical History:  Diagnosis Date   Anxiety    Arthritis    "all over; mostly knees and shoulders"  (06/04/2015)    Cataract    Family history of adverse reaction to anesthesia    Patients mother had bad N/V after anesthesia   GERD (gastroesophageal reflux disease)    High cholesterol    Hypertension    Primary localized osteoarthritis of left knee      Past Surgical History:  Procedure Laterality Date   APPENDECTOMY     CHOLECYSTECTOMY OPEN     pain   COLONOSCOPY N/A 04/22/2016   Procedure: COLONOSCOPY;  Surgeon: Malissa Hippo, MD;  Location: [facility];  Service: Endoscopy;  Laterality: N/A;  1200   COLONOSCOPY W/ POLYPECTOMY     COLONOSCOPY WITH PROPOFOL N/A 10/31/2021   Procedure: COLONOSCOPY WITH PROPOFOL;  Surgeon: Dolores Frame, MD;  Location: [facility];  Service: Gastroenterology;  Laterality: N/A;  935   ESOPHAGOGASTRODUODENOSCOPY N/A 09/08/2017   Procedure: ESOPHAGOGASTRODUODENOSCOPY (EGD) WITH ESOPHAGEAL DILATION;  Surgeon: Malissa Hippo, MD;  Location: [facility];   Service: Endoscopy;  Laterality: N/A;  3:00   JOINT REPLACEMENT     KNEE ARTHROSCOPY W/ MENISCECTOMY Left    KNEE SURGERY Left 1990s   MASS EXCISION N/A 07/27/2016   Procedure: EXCISION OF SEBACEOUS CYST ON NECK;  Surgeon: Newman Pies, MD;  Location: [facility];  Service: ENT;  Laterality: N/A;   OOPHORECTOMY     for a cyst ? 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   Current Outpatient Medications:    hydrochlorothiazide (HYDRODIURIL) 25 MG tablet, Take 1 tablet (25 mg total) by mouth daily., Disp: 90 tablet, Rfl: 1   ALPRAZolam (XANAX) 0.5 MG tablet, Take 0.5 mg by mouth at bedtime as needed for anxiety., Disp: , Rfl:    Cholecalciferol (VITAMIN D-3) 5000 units TABS, Take 5,000 Units by mouth in the morning., Disp: , Rfl:    Dexlansoprazole 30 MG capsule DR, Take 30 mg by mouth daily., Disp: , Rfl:    diclofenac (VOLTAREN) 75 MG EC tablet, Take 75 mg by mouth 2 (two) times daily., Disp: , Rfl:    fluticasone (FLONASE) 50 MCG/ACT nasal spray, Place 2 sprays into both nostrils daily., Disp: 16 g, Rfl:  5   gabapentin (NEURONTIN) 300 MG capsule, Take 300 mg by mouth at bedtime., Disp: , Rfl:    lansoprazole (PREVACID) 30 MG capsule, Take 30 mg by mouth every morning., Disp: , Rfl:    losartan (COZAAR) 50 MG tablet, Take 50 mg by mouth 2 (two) times daily., Disp: , Rfl:    montelukast (SINGULAIR) 10 MG tablet, Take 1 tablet (10 mg total) by mouth at bedtime. (Patient not taking: Reported on 06/29/2023), Disp: 30 tablet, Rfl: 5   Multiple Vitamins-Minerals (PRESERVISION AREDS 2) CAPS, Take 1  capsule by mouth 2 (two) times daily., Disp: , Rfl:    rosuvastatin (CRESTOR) 5 MG tablet, Take 5 mg by mouth every evening., Disp: , Rfl:    sucralfate (CARAFATE) 1 G tablet, Take 1 g by mouth in the morning., Disp: , Rfl:   Allergies  Allergen Reactions   Cefzil [Cefprozil] Rash    Objective:   BP (!) 148/74   Pulse 71   Temp 98.6 F (37 C) (Oral)   Ht 5\' 3"  (1.6 m)   Wt 190 lb (86.2 kg)   SpO2 96%   BMI 33.66 kg/m      06/29/2023   10:35 AM 06/29/2023   10:30 AM 06/01/2023    9:25 AM  Vitals with BMI  Height  5\' 3"  5' 3.39"  Weight  190 lbs 193 lbs 8 oz  BMI  33.67 33.86  Systolic 148 152 098  Diastolic 74 80 100  Pulse  71 80     Physical Exam Vitals and nursing note reviewed.  Constitutional:      Appearance: Normal appearance. She is normal weight.  HENT:     Head: Normocephalic and atraumatic.  Cardiovascular:     Rate and Rhythm: Normal rate and regular rhythm.     Pulses: Normal pulses.     Heart sounds: Murmur heard.  Pulmonary:     Effort: Pulmonary effort is normal.     Breath sounds: Normal breath sounds.  Skin:    General: Skin is warm and dry.  Neurological:     General: No focal deficit present.     Mental Status: She is alert and oriented to person, place, and time. Mental status is at baseline.  Psychiatric:        Mood and Affect: Mood normal.        Behavior: Behavior normal.        Thought Content: Thought content normal.        Judgment: Judgment normal.     Assessment & Plan:  Primary hypertension Assessment & Plan: Chronic uncontrolled. Increase hydrochlorothiazide to 25mg  daily and continue Losartan 50mg  BID. She reports her murmur is not nbew and she has had a normal ECHO. Continue to monitor at home and seek medical care if sustains >180/100. Recommend heart healthy diet such as Mediterranean diet with whole grains, fruits, vegetable, fish, lean meats, nuts, and olive oil. Limit salt. Encouraged moderate walking, 3-5 times/week  for 30-50 minutes each session. Aim for at least 150 minutes.week. Goal should be pace of 3 miles/hours, or walking 1.5 miles in 30 minutes. Avoid tobacco products. Avoid excess alcohol. Take medications as prescribed and bring medications and blood pressure log with cuff to each office visit. Seek medical care for chest pain, palpitations, shortness of breath with exertion, dizziness/lightheadedness, vision changes, recurrent headaches, or swelling of extremities. Follow up in 4 weeks.  Fasting labs today    Orders: -     CBC with  Differential/Platelet -     COMPLETE METABOLIC PANEL WITH GFR -     Lipid panel  Mixed hyperlipidemia Assessment & Plan: Fasting labs today.  Orders: -     CBC with Differential/Platelet -     COMPLETE METABOLIC PANEL WITH GFR -     Lipid panel  Other orders -     hydroCHLOROthiazide; Take 1 tablet (25 mg total) by mouth daily.  Dispense: 90 tablet; Refill: 1     Follow up plan: Return in about 4 weeks (around 07/27/2023) for hypertension.  Park Meo, FNP

## 2023-06-29 NOTE — Telephone Encounter (Signed)
Per pt has never tried Trazodone. She is ok to try this med.

## 2023-06-29 NOTE — Assessment & Plan Note (Signed)
Fasting labs today

## 2023-06-29 NOTE — Telephone Encounter (Signed)
Pt was in to see pcp today and forgot to ask for a refill of this med ALPRAZolam (XANAX) 0.5 MG tablet [[id number]]. Pt stated that if pcp does not want to refill this med pt is up to anything that pcp would feel is better for her to take to help her fall asleep at night. Please advise.  Cb#: [phone]

## 2023-06-29 NOTE — Assessment & Plan Note (Addendum)
Chronic uncontrolled. Increase hydrochlorothiazide to 25mg  daily and continue Losartan 50mg  BID. She reports her murmur is not nbew and she has had a normal ECHO. Continue to monitor at home and seek medical care if sustains >180/100. Recommend heart healthy diet such as Mediterranean diet with whole grains, fruits, vegetable, fish, lean meats, nuts, and olive oil. Limit salt. Encouraged moderate walking, 3-5 times/week for 30-50 minutes each session. Aim for at least 150 minutes.week. Goal should be pace of 3 miles/hours, or walking 1.5 miles in 30 minutes. Avoid tobacco products. Avoid excess alcohol. Take medications as prescribed and bring medications and blood pressure log with cuff to each office visit. Seek medical care for chest pain, palpitations, shortness of breath with exertion, dizziness/lightheadedness, vision changes, recurrent headaches, or swelling of extremities. Follow up in 4 weeks.  Fasting labs today

## 2023-06-29 NOTE — Telephone Encounter (Signed)
Pls advise.

## 2023-06-30 LAB — COMPLETE METABOLIC PANEL WITH GFR
AG Ratio: 1.7 (calc) (ref 1.0–2.5)
ALT: 11 U/L (ref 6–29)
AST: 14 U/L (ref 10–35)
Albumin: 4.2 g/dL (ref 3.6–5.1)
Alkaline phosphatase (APISO): 63 U/L (ref 37–153)
BUN: 13 mg/dL (ref 7–25)
CO2: 29 mmol/L (ref 20–32)
Calcium: 9.9 mg/dL (ref 8.6–10.4)
Chloride: 102 mmol/L (ref 98–110)
Creat: 0.74 mg/dL (ref 0.60–1.00)
Globulin: 2.5 g/dL (ref 1.9–3.7)
Glucose, Bld: 90 mg/dL (ref 65–99)
Potassium: 4 mmol/L (ref 3.5–5.3)
Sodium: 140 mmol/L (ref 135–146)
Total Bilirubin: 0.7 mg/dL (ref 0.2–1.2)
Total Protein: 6.7 g/dL (ref 6.1–8.1)
eGFR: 82 mL/min/{1.73_m2} (ref >=60)

## 2023-06-30 LAB — CBC WITH DIFFERENTIAL/PLATELET
Absolute Lymphocytes: 2806 {cells}/uL (ref 850–3900)
Absolute Monocytes: 561 {cells}/uL (ref 200–950)
Basophils Absolute: 43 {cells}/uL (ref 0–200)
Basophils Relative: 0.7 %
Eosinophils Absolute: 262 {cells}/uL (ref 15–500)
Eosinophils Relative: 4.3 %
HCT: 40.7 % (ref 35.0–45.0)
Hemoglobin: 13.6 g/dL (ref 11.7–15.5)
MCH: 29.8 pg (ref 27.0–33.0)
MCHC: 33.4 g/dL (ref 32.0–36.0)
MCV: 89.1 fL (ref 80.0–100.0)
MPV: 10.8 fL (ref 7.5–12.5)
Monocytes Relative: 9.2 %
Neutro Abs: 2428 {cells}/uL (ref 1500–7800)
Neutrophils Relative %: 39.8 %
Platelets: 229 10*3/uL (ref 140–400)
RBC: 4.57 10*6/uL (ref 3.80–5.10)
RDW: 12.1 % (ref 11.0–15.0)
Total Lymphocyte: 46 %
WBC: 6.1 10*3/uL (ref 3.8–10.8)

## 2023-06-30 LAB — LIPID PANEL
Cholesterol: 168 mg/dL (ref <200)
HDL: 58 mg/dL (ref >=50)
LDL Cholesterol (Calc): 84 mg/dL
Non-HDL Cholesterol (Calc): 110 mg/dL (ref <130)
Total CHOL/HDL Ratio: 2.9 (calc) (ref <5.0)
Triglycerides: 159 mg/dL — ABNORMAL HIGH (ref <150)

## 2023-07-27 ENCOUNTER — Encounter: Payer: Self-pay | Admitting: Family Medicine

## 2023-07-27 ENCOUNTER — Ambulatory Visit (INDEPENDENT_AMBULATORY_CARE_PROVIDER_SITE_OTHER): Payer: Medicare Other | Admitting: Family Medicine

## 2023-07-27 VITALS — BP 142/68 | HR 72 | Temp 98.2°F | Ht 63.0 in | Wt 191.0 lb

## 2023-07-27 DIAGNOSIS — I10 Essential (primary) hypertension: Secondary | ICD-10-CM | POA: Diagnosis not present

## 2023-07-27 DIAGNOSIS — Z23 Encounter for immunization: Secondary | ICD-10-CM | POA: Diagnosis not present

## 2023-07-27 MED ORDER — TRAZODONE HCL 50 MG PO TABS: 50.0000 mg | ORAL_TABLET | Freq: Every day | ORAL | 1 refills | Status: DC

## 2023-07-27 MED ORDER — LOSARTAN POTASSIUM 50 MG PO TABS: 50.0000 mg | ORAL_TABLET | Freq: Two times a day (BID) | ORAL | 1 refills | Status: DC

## 2023-07-27 NOTE — Progress Notes (Signed)
 Subjective:  HPI: [name] is a 78 y.o. female presenting on 07/27/2023 for No chief complaint on file.   HPI Patient is in today for blood pressure follow up. Hydrochlorothiazide previously increased to 25mg  daily and she continued Losartan 50mg  BID. Home readings have been great.  HYPERTENSION without Chronic Kidney Disease Hypertension status: better  Satisfied with current treatment? yes Duration of hypertension: chronic BP monitoring frequency:  daily BP range: 319 353 4890 BP medication side effects:  no Medication compliance: excellent compliance Previous BP meds:HCTZ and losartan (cozaar) Aspirin: no Recurrent headaches: no Visual changes: no Palpitations: no Dyspnea: no Chest pain: no Lower extremity edema: no Dizzy/lightheaded: no   Review of Systems  All other systems reviewed and are negative.   Relevant past medical history reviewed and updated as indicated.   Past Medical History:  Diagnosis Date   Anxiety    Arthritis    "all over; mostly knees and shoulders"  (06/04/2015)    Cataract    Family history of adverse reaction to anesthesia    Patients mother had bad N/V after anesthesia   GERD (gastroesophageal reflux disease)    High cholesterol    Hypertension    Primary localized osteoarthritis of left knee      Past Surgical History:  Procedure Laterality Date   APPENDECTOMY     CHOLECYSTECTOMY OPEN     pain   COLONOSCOPY N/A 04/22/2016   Procedure: COLONOSCOPY;  Surgeon: Malissa Hippo, MD;  [facility]: [facility];  Service: Endoscopy;  Laterality: N/A;  1200   COLONOSCOPY W/ POLYPECTOMY     COLONOSCOPY WITH PROPOFOL N/A 10/31/2021   Procedure: COLONOSCOPY WITH PROPOFOL;  Surgeon: Dolores Frame, MD;  [facility]: [facility];  Service: Gastroenterology;  Laterality: N/A;  935   ESOPHAGOGASTRODUODENOSCOPY N/A 09/08/2017   Procedure: ESOPHAGOGASTRODUODENOSCOPY (EGD) WITH ESOPHAGEAL DILATION;  Surgeon: Malissa Hippo, MD;  Location:  [facility];  Service: Endoscopy;  Laterality: N/A;  3:00   JOINT REPLACEMENT     KNEE ARTHROSCOPY W/ MENISCECTOMY Left    KNEE SURGERY Left 1990s   MASS EXCISION N/A 07/27/2016   Procedure: EXCISION OF SEBACEOUS CYST ON NECK;  Surgeon: Newman Pies, MD;  Location: [facility];  Service: ENT;  Laterality: N/A;   OOPHORECTOMY     for a cyst ? side   POLYPECTOMY  10/31/2021   Procedure: POLYPECTOMY;  Surgeon: Dolores Frame, MD;  [facility]: [facility];  Service: Gastroenterology;;   TOTAL KNEE ARTHROPLASTY Left 06/03/2015   TOTAL KNEE ARTHROPLASTY Left 06/03/2015   Procedure: TOTAL LEFT KNEE ARTHROPLASTY;  Surgeon: Salvatore Marvel, MD;  Location: [facility] OR;  Service: Orthopedics;  Laterality: Left;   TUBAL LIGATION      Allergies and medications reviewed and updated.   Current Outpatient Medications:    Cholecalciferol (VITAMIN D-3) 5000 units TABS, Take 5,000 Units by mouth in the morning., Disp: , Rfl:    Dexlansoprazole 30 MG capsule DR, Take 30 mg by mouth daily., Disp: , Rfl:    diclofenac (VOLTAREN) 75 MG EC tablet, Take 75 mg by mouth 2 (two) times daily., Disp: , Rfl:    fluticasone (FLONASE) 50 MCG/ACT nasal spray, Place 2 sprays into both nostrils daily., Disp: 16 g, Rfl: 5   gabapentin (NEURONTIN) 300 MG capsule, Take 300 mg by mouth at bedtime., Disp: , Rfl:    hydrochlorothiazide (HYDRODIURIL) 25 MG tablet, Take 1 tablet (25 mg total) by mouth daily., Disp: 90 tablet, Rfl: 1  lansoprazole (PREVACID) 30 MG capsule, Take 30 mg by mouth every morning., Disp: , Rfl:    montelukast (SINGULAIR) 10 MG tablet, Take 1 tablet (10 mg total) by mouth at bedtime., Disp: 30 tablet, Rfl: 5   Multiple Vitamins-Minerals (PRESERVISION AREDS 2) CAPS, Take 1 capsule by mouth 2 (two) times daily., Disp: , Rfl:    rosuvastatin (CRESTOR) 5 MG tablet, Take 5 mg by mouth every evening., Disp: , Rfl:    sucralfate (CARAFATE) 1 G tablet, Take 1 g by mouth in the morning., Disp: , Rfl:     losartan (COZAAR) 50 MG tablet, Take 1 tablet (50 mg total) by mouth 2 (two) times daily., Disp: 180 tablet, Rfl: 1   traZODone (DESYREL) 50 MG tablet, Take 1 tablet (50 mg total) by mouth at bedtime., Disp: 30 tablet, Rfl: 1  Allergies  Allergen Reactions   Cefzil [Cefprozil] Rash    Objective:   BP (!) 142/68   Pulse 72   Temp 98.2 F (36.8 C) (Oral)   Ht 5\' 3"  (1.6 m)   Wt 191 lb (86.6 kg)   SpO2 94%   BMI 33.83 kg/m      07/27/2023   10:09 AM 07/27/2023   10:07 AM 06/29/2023   10:35 AM  Vitals with BMI  Height  5\' 3"    Weight  191 lbs   BMI  33.84   Systolic 142 150 409  Diastolic 68 68 74  Pulse  72      Physical Exam Vitals and nursing note reviewed.  Constitutional:      Appearance: Normal appearance. She is normal weight.  HENT:     Head: Normocephalic and atraumatic.  Cardiovascular:     Rate and Rhythm: Normal rate and regular rhythm.     Pulses: Normal pulses.     Heart sounds: Murmur heard.  Pulmonary:     Effort: Pulmonary effort is normal.     Breath sounds: Normal breath sounds.  Skin:    General: Skin is warm and dry.  Neurological:     General: No focal deficit present.     Mental Status: She is alert and oriented to person, place, and time. Mental status is at baseline.  Psychiatric:        Mood and Affect: Mood normal.        Behavior: Behavior normal.        Thought Content: Thought content normal.        Judgment: Judgment normal.     Assessment & Plan:  Primary hypertension Assessment & Plan: improved. continue hydrochlorothiazide 25mg  daily and Losartan 50mg  BID. Continue to monitor at home. Recommend heart healthy diet such as Mediterranean diet with whole grains, fruits, vegetable, fish, lean meats, nuts, and olive oil. Limit salt. Encouraged moderate walking, 3-5 times/week for 30-50 minutes each session. Aim for at least 150 minutes.week. Goal should be pace of 3 miles/hours, or walking 1.5 miles in 30 minutes. Avoid tobacco  products. Avoid excess alcohol. Take medications as prescribed and bring medications and blood pressure log with cuff to each office visit. Seek medical care for chest pain, palpitations, shortness of breath with exertion, dizziness/lightheadedness, vision changes, recurrent headaches, or swelling of extremities. Labs today Follow up 3 months  Orders: -     Comprehensive metabolic panel  Other orders -     traZODone HCl; Take 1 tablet (50 mg total) by mouth at bedtime.  Dispense: 30 tablet; Refill: 1 -     Losartan Potassium; Take  1 tablet (50 mg total) by mouth 2 (two) times daily.  Dispense: 180 tablet; Refill: 1     Follow up plan: Return in about 3 months (around 10/24/2023) for hypertension.  Park Meo, FNP

## 2023-07-27 NOTE — Assessment & Plan Note (Signed)
 improved. continue hydrochlorothiazide 25mg  daily and Losartan 50mg  BID. Continue to monitor at home. Recommend heart healthy diet such as Mediterranean diet with whole grains, fruits, vegetable, fish, lean meats, nuts, and olive oil. Limit salt. Encouraged moderate walking, 3-5 times/week for 30-50 minutes each session. Aim for at least 150 minutes.week. Goal should be pace of 3 miles/hours, or walking 1.5 miles in 30 minutes. Avoid tobacco products. Avoid excess alcohol. Take medications as prescribed and bring medications and blood pressure log with cuff to each office visit. Seek medical care for chest pain, palpitations, shortness of breath with exertion, dizziness/lightheadedness, vision changes, recurrent headaches, or swelling of extremities. Labs today Follow up 3 months

## 2023-07-27 NOTE — Addendum Note (Signed)
 Addended by: Arta Silence on: 07/27/2023 12:55 PM   Modules accepted: Orders

## 2023-07-28 LAB — COMPREHENSIVE METABOLIC PANEL
AG Ratio: 1.4 (calc) (ref 1.0–2.5)
ALT: 9 U/L (ref 6–29)
AST: 15 U/L (ref 10–35)
Albumin: 3.9 g/dL (ref 3.6–5.1)
Alkaline phosphatase (APISO): 67 U/L (ref 37–153)
BUN: 20 mg/dL (ref 7–25)
CO2: 29 mmol/L (ref 20–32)
Calcium: 9.6 mg/dL (ref 8.6–10.4)
Chloride: 101 mmol/L (ref 98–110)
Creat: 0.9 mg/dL (ref 0.60–1.00)
Globulin: 2.7 g/dL (ref 1.9–3.7)
Glucose, Bld: 100 mg/dL — ABNORMAL HIGH (ref 65–99)
Potassium: 4 mmol/L (ref 3.5–5.3)
Sodium: 138 mmol/L (ref 135–146)
Total Bilirubin: 0.5 mg/dL (ref 0.2–1.2)
Total Protein: 6.6 g/dL (ref 6.1–8.1)

## 2023-08-02 NOTE — Progress Notes (Signed)
 [facility] - Clinic Note  08/09/2023   CHIEF COMPLAINT Patient presents for Retina Follow Up  HISTORY OF PRESENT ILLNESS: [name] is a 78 y.o. female who presents to the clinic today for:  HPI     Retina Follow Up   Patient presents with  Dry AMD.  In both eyes.  This started 6 months ago.  Duration of 6 months.  Since onset it is stable.  I, the attending physician,  performed the HPI with the patient and updated documentation appropriately.        Comments   Patient states that she can not see any difference in her vision. She is using AT's PRN.      Last edited by Rennis Chris, MD on 08/09/2023 10:43 PM.     Pt states her vision is doing well, she is using Systane at night   Referring physician: Leone Payor, FNP [address] [place],  [place] [address]  HISTORICAL INFORMATION:  Selected notes from the MEDICAL RECORD NUMBER Referred by Dr. Charise Killian LEE:  Ocular Hx- PMH-   CURRENT MEDICATIONS: No current outpatient medications on file. (Ophthalmic Drugs)   No current facility-administered medications for this visit. (Ophthalmic Drugs)   Current Outpatient Medications (Other)  Medication Sig   Cholecalciferol (VITAMIN D-3) 5000 units TABS Take 5,000 Units by mouth in the morning.   Dexlansoprazole 30 MG capsule DR Take 30 mg by mouth daily.   diclofenac (VOLTAREN) 75 MG EC tablet Take 75 mg by mouth 2 (two) times daily.   fluticasone (FLONASE) 50 MCG/ACT nasal spray Place 2 sprays into both nostrils daily.   gabapentin (NEURONTIN) 300 MG capsule Take 300 mg by mouth at bedtime.   hydrochlorothiazide (HYDRODIURIL) 25 MG tablet Take 1 tablet (25 mg total) by mouth daily.   lansoprazole (PREVACID) 30 MG capsule Take 30 mg by mouth every morning.   losartan (COZAAR) 50 MG tablet Take 1 tablet (50 mg total) by mouth 2 (two) times daily.   montelukast (SINGULAIR) 10 MG tablet Take 1 tablet (10 mg total) by mouth at bedtime.   Multiple  Vitamins-Minerals (PRESERVISION AREDS 2) CAPS Take 1 capsule by mouth 2 (two) times daily.   rosuvastatin (CRESTOR) 5 MG tablet Take 5 mg by mouth every evening.   sucralfate (CARAFATE) 1 G tablet Take 1 g by mouth in the morning.   traZODone (DESYREL) 50 MG tablet Take 1 tablet (50 mg total) by mouth at bedtime.   No current facility-administered medications for this visit. (Other)   REVIEW OF SYSTEMS: ROS   Positive for: Musculoskeletal, Cardiovascular, Eyes Last edited by Charlette Caffey, COT on 08/09/2023  7:38 AM.       ALLERGIES Allergies  Allergen Reactions   Cefzil [Cefprozil] Rash   PAST MEDICAL HISTORY Past Medical History:  Diagnosis Date   Anxiety    Arthritis    "all over; mostly knees and shoulders"  (06/04/2015)    Cataract    Family history of adverse reaction to anesthesia    Patients mother had bad N/V after anesthesia   GERD (gastroesophageal reflux disease)    High cholesterol    Hypertension    Primary localized osteoarthritis of left knee    Past Surgical History:  Procedure Laterality Date   APPENDECTOMY     CHOLECYSTECTOMY OPEN     pain   COLONOSCOPY N/A 04/22/2016   Procedure: COLONOSCOPY;  Surgeon: Malissa Hippo, MD;  [facility];  Service: Endoscopy;  Laterality: N/A;  1200   COLONOSCOPY W/ POLYPECTOMY     COLONOSCOPY WITH PROPOFOL N/A 10/31/2021   Procedure: COLONOSCOPY WITH PROPOFOL;  Surgeon: Dolores Frame, MD;  [facility]: [facility];  Service: Gastroenterology;  Laterality: N/A;  935   ESOPHAGOGASTRODUODENOSCOPY N/A 09/08/2017   Procedure: ESOPHAGOGASTRODUODENOSCOPY (EGD) WITH ESOPHAGEAL DILATION;  Surgeon: Malissa Hippo, MD;  [facility]: [facility];  Service: Endoscopy;  Laterality: N/A;  3:00   JOINT REPLACEMENT     KNEE ARTHROSCOPY W/ MENISCECTOMY Left    KNEE SURGERY Left 1990s   MASS EXCISION N/A 07/27/2016   Procedure: EXCISION OF SEBACEOUS CYST ON NECK;  Surgeon: Newman Pies, MD;  Location: [facility];  Service: ENT;  Laterality: N/A;   OOPHORECTOMY     for a cyst ? side   POLYPECTOMY  10/31/2021   Procedure: POLYPECTOMY;  Surgeon: Dolores Frame, MD;  [facility]: [facility];  Service: Gastroenterology;;   TOTAL KNEE ARTHROPLASTY Left 06/03/2015   TOTAL KNEE ARTHROPLASTY Left 06/03/2015   Procedure: TOTAL LEFT KNEE ARTHROPLASTY;  Surgeon: Salvatore Marvel, MD;  Location: [facility];  Service: Orthopedics;  Laterality: Left;   TUBAL LIGATION     FAMILY HISTORY Family History  Problem Relation Age of Onset   Heart disease Mother    Diabetes Mellitus II Mother    Kidney disease Mother    Cancer Mother    Hypertension Father    Cancer Father    Hypertension Sister    Hypertension Brother    Colon cancer Neg Hx    SOCIAL HISTORY Social History   Tobacco Use   Smoking status: Never   Smokeless tobacco: Never  Vaping Use   Vaping status: Never Used  Substance Use Topics   Alcohol use: No    Alcohol/week: 0.0 standard drinks of alcohol   Drug use: No       OPHTHALMIC EXAM:  Base Eye Exam     Visual Acuity (Snellen - Linear)       Right Left   Dist cc 20/30 +2 20/30   Dist ph cc NI NI         Tonometry (Tonopen, 7:41 AM)       Right Left   Pressure 15 14         Pupils       Dark Light Shape React APD   Right 3 2 Round Brisk None   Left 3 2 Round Brisk None         Visual Fields       Left Right    Full Full         Extraocular Movement       Right Left    Full, Ortho Full, Ortho         Neuro/Psych     Oriented x3: Yes   Mood/Affect: Normal         Dilation     Both eyes: 1.0% Mydriacyl, 2.5% Phenylephrine @ 7:39 AM           Slit Lamp and Fundus Exam     Slit Lamp Exam       Right Left   Lids/Lashes Dermatochalasis - upper lid Dermatochalasis - upper lid   Conjunctiva/Sclera White and quiet White and quiet   Cornea well healed cataract wound, 1+ Punctate epithelial erosions, trace tear film debris  trace tear film debris, well healed cataract wound, trace PEE   Anterior Chamber deep and clear deep and clear  Iris Round and dilated Round and dilated   Lens PC IOL in good position PC IOL in good position   Anterior Vitreous mild syneresis, Posterior vitreous detachment Vitreous syneresis         Fundus Exam       Right Left   Disc Pink and Sharp Pink and Sharp   C/D Ratio 0.3 0.1   Macula Flat, Blunted foveal reflex, central PEDs, drusen, RPE mottling and clumping, no heme Flat, Blunted foveal reflex, prominent central PEDs, drusen, RPE mottling and clumping, no heme or edema   Vessels attenuated, mild tortuosity attenuated, mild tortuosity, mild copper wiring   Periphery Attached, mild reticular degeneration, mild peripheral drusen, No heme Attached, mild reticular degeneration, mild peripheral drusen, No heme           Refraction     Wearing Rx       Sphere Cylinder Axis Add   Right -1.00 +1.50 015 +2.50   Left -1.50 +1.50 164 +2.50           IMAGING AND PROCEDURES  Imaging and Procedures for 08/09/2023  OCT, Retina - OU - Both Eyes       Right Eye Quality was good. Central Foveal Thickness: 302. Progression has been stable. Findings include normal foveal contour, no IRF, no SRF, retinal drusen , pigment epithelial detachment.   Left Eye Quality was good. Central Foveal Thickness: 299. Progression has been stable. Findings include no IRF, no SRF, abnormal foveal contour, retinal drusen , pigment epithelial detachment, outer retinal atrophy (Prominent central PEDs with ORA).   Notes *Images captured and stored on drive  Diagnosis / Impression:  Non-exu ARMD OU  Clinical management:  See below  Abbreviations: NFP - Normal foveal profile. CME - cystoid macular edema. PED - pigment epithelial detachment. IRF - intraretinal fluid. SRF - subretinal fluid. EZ - ellipsoid zone. ERM - epiretinal membrane. ORA - outer retinal atrophy. ORT - outer retinal  tubulation. SRHM - subretinal hyper-reflective material. IRHM - intraretinal hyper-reflective material           ASSESSMENT/PLAN:   ICD-10-CM   1. Intermediate stage nonexudative age-related macular degeneration of both eyes  H35.3132 OCT, Retina - OU - Both Eyes    2. Essential hypertension  I10     3. Hypertensive retinopathy of both eyes  H35.033     4. Pseudophakia, both eyes  Z96.1      Age related macular degeneration, non-exudative, both eyes - intermediate stage with +PED centrally -- no heme or exudative disease  - The incidence, anatomy, and pathology of dry AMD, risk of progression, and the AREDS and AREDS 2 study including smoking risks discussed with patient.  - BCVA OD 20/30 -- stable, OS 20/30 from 20/25  - Cont amsler grid monitoring  - f/u 6 months, DFE, OCT  2,3. Hypertensive retinopathy OU - discussed importance of tight BP control - monitor  4. Pseudophakia OU  - s/p CE/IOL  - IOL in good position, doing well  - monitor   Ophthalmic Meds Ordered this visit:  No orders of the defined types were placed in this encounter.    Return in about 6 months (around 02/09/2024) for f/u non-exu ARMD OU, DFE, OCT.  There are no Patient Instructions on file for this visit.  Explained the diagnoses, plan, and follow up with the patient and they expressed understanding.  Patient expressed understanding of the importance of proper follow up care.   This document serves as a record  of services personally performed by Karie Chimera, MD, PhD. It was created on their behalf by Glee Arvin. Manson Passey, OA an ophthalmic technician. The creation of this record is the provider's dictation and/or activities during the visit.    Electronically signed by: Glee Arvin. Manson Passey, OA 08/09/23 10:43 PM  Karie Chimera, M.D., Ph.D. Diseases & Surgery of the Retina and Vitreous [facility] 08/09/2023  I have reviewed the above documentation for accuracy and  completeness, and I agree with the above. Karie Chimera, M.D., Ph.D. 08/09/23 10:44 PM   Abbreviations: M myopia (nearsighted); A astigmatism; H hyperopia (farsighted); P presbyopia; Mrx spectacle prescription;  CTL contact lenses; OD right eye; OS left eye; OU both eyes  XT exotropia; ET esotropia; PEK punctate epithelial keratitis; PEE punctate epithelial erosions; DES dry eye syndrome; MGD meibomian gland dysfunction; ATs artificial tears; PFAT's preservative free artificial tears; NSC nuclear sclerotic cataract; PSC posterior subcapsular cataract; ERM epi-retinal membrane; PVD posterior vitreous detachment; RD retinal detachment; DM diabetes mellitus; DR diabetic retinopathy; NPDR non-proliferative diabetic retinopathy; PDR proliferative diabetic retinopathy; CSME clinically significant macular edema; DME diabetic macular edema; dbh dot blot hemorrhages; CWS cotton wool spot; POAG primary open angle glaucoma; C/D cup-to-disc ratio; HVF humphrey visual field; GVF goldmann visual field; OCT optical coherence tomography; IOP intraocular pressure; BRVO Branch retinal vein occlusion; CRVO central retinal vein occlusion; CRAO central retinal artery occlusion; BRAO branch retinal artery occlusion; RT retinal tear; SB scleral buckle; PPV pars plana vitrectomy; VH Vitreous hemorrhage; PRP panretinal laser photocoagulation; IVK intravitreal kenalog; VMT vitreomacular traction; MH Macular hole;  NVD neovascularization of the disc; NVE neovascularization elsewhere; AREDS age related eye disease study; ARMD age related macular degeneration; POAG primary open angle glaucoma; EBMD epithelial/anterior basement membrane dystrophy; ACIOL anterior chamber intraocular lens; IOL intraocular lens; PCIOL posterior chamber intraocular lens; Phaco/IOL phacoemulsification with intraocular lens placement; PRK photorefractive keratectomy; LASIK laser assisted in situ keratomileusis; HTN hypertension; DM diabetes mellitus; COPD chronic  obstructive pulmonary disease

## 2023-08-09 ENCOUNTER — Ambulatory Visit (INDEPENDENT_AMBULATORY_CARE_PROVIDER_SITE_OTHER): Payer: Medicare Other | Admitting: Ophthalmology

## 2023-08-09 ENCOUNTER — Encounter (INDEPENDENT_AMBULATORY_CARE_PROVIDER_SITE_OTHER): Payer: Self-pay | Admitting: Ophthalmology

## 2023-08-09 DIAGNOSIS — I10 Essential (primary) hypertension: Secondary | ICD-10-CM | POA: Diagnosis not present

## 2023-08-09 DIAGNOSIS — Z961 Presence of intraocular lens: Secondary | ICD-10-CM | POA: Diagnosis not present

## 2023-08-09 DIAGNOSIS — H353132 Nonexudative age-related macular degeneration, bilateral, intermediate dry stage: Secondary | ICD-10-CM

## 2023-08-09 DIAGNOSIS — H35033 Hypertensive retinopathy, bilateral: Secondary | ICD-10-CM

## 2023-09-09 ENCOUNTER — Encounter (INDEPENDENT_AMBULATORY_CARE_PROVIDER_SITE_OTHER): Payer: Self-pay | Admitting: *Deleted

## 2023-09-13 ENCOUNTER — Other Ambulatory Visit: Payer: Self-pay | Admitting: Family Medicine

## 2023-09-13 NOTE — Telephone Encounter (Signed)
 Copied from CRM [id number]. Topic: Clinical - Medication Refill >> Sep 13, 2023  3:36 PM Alpha Arts wrote: Most Recent Primary Care Visit:  Provider: Jenelle Mis  Department: [facility]  Visit Type: OFFICE VISIT  Date: 07/27/2023  Medication: sucralfate (CARAFATE) 1 G tablet   Has the patient contacted their pharmacy? Yes (Agent: If no, request that the patient contact the pharmacy for the refill. If patient does not wish to contact the pharmacy document the reason why and proceed with request.) (Agent: If yes, when and what did the pharmacy advise?) Pharmacy call to request refill  Is this the correct pharmacy for this prescription? Yes If no, delete pharmacy and type the correct one.  This is the patient's preferred pharmacy:  [facility] - [place], [place] - [address] [place] [address] Phone: [phone] Fax: [phone]   Has the prescription been filled recently? Yes  Is the patient out of the medication? Yes  Has the patient been seen for an appointment in the last year OR does the patient have an upcoming appointment? Yes  Can we respond through MyChart? Yes  Agent: Please be advised that Rx refills may take up to 3 business days. We ask that you follow-up with your pharmacy.

## 2023-09-14 ENCOUNTER — Other Ambulatory Visit: Payer: Self-pay

## 2023-09-14 ENCOUNTER — Telehealth: Payer: Self-pay

## 2023-09-14 DIAGNOSIS — K219 Gastro-esophageal reflux disease without esophagitis: Secondary | ICD-10-CM

## 2023-09-14 MED ORDER — SUCRALFATE 1 G PO TABS: 1.0000 g | ORAL_TABLET | Freq: Every morning | ORAL | 1 refills | Status: DC

## 2023-09-14 NOTE — Telephone Encounter (Signed)
 Copied from CRM [id number]. Topic: Clinical - Medication Question >> Sep 14, 2023 12:39 PM Sasha H wrote: Reason for CRM: PT would like to know if her provider can call in sucralfate (CARAFATE) 1 G tablet, since the old order was refused due to it being from her old doctor.

## 2023-10-04 ENCOUNTER — Telehealth (INDEPENDENT_AMBULATORY_CARE_PROVIDER_SITE_OTHER): Payer: Self-pay | Admitting: Gastroenterology

## 2023-10-04 DIAGNOSIS — K5900 Constipation, unspecified: Secondary | ICD-10-CM

## 2023-10-04 NOTE — Telephone Encounter (Signed)
 Who is your primary care physician: [name]  Reasons for the colonoscopy:   Have you had a colonoscopy before?  Yes 3 years ago  Do you have family history of colon cancer? no  Previous colonoscopy with polyps removed? Yes 3 years ago  Do you have a history colorectal cancer?   no  Are you diabetic? If yes, Type 1 or Type 2?    no  Do you have a prosthetic or mechanical heart valve? no  Do you have a pacemaker/defibrillator?   no  Have you had endocarditis/atrial fibrillation? no  Have you had joint replacement within the last 12 months?  no  Do you tend to be constipated or have to use laxatives? no  Do you have any history of drugs or alchohol?  no  Do you use supplemental oxygen?  no  Have you had a stroke or heart attack within the last 6 months? no  Do you take weight loss medication?  no  For female patients: have you had a hysterectomy?  no                                     are you post menopausal?       no                                            do you still have your menstrual cycle? no      Do you take any blood-thinning medications such as: (aspirin, warfarin, Plavix, Aggrenox)  no  If yes we need the name, milligram, dosage and who is prescribing doctor  Current Outpatient Medications on File Prior to Visit  Medication Sig Dispense Refill   Cholecalciferol (VITAMIN D-3) 5000 units TABS Take 5,000 Units by mouth in the morning.     Dexlansoprazole  30 MG capsule DR Take 30 mg by mouth daily.     gabapentin (NEURONTIN) 300 MG capsule Take 300 mg by mouth at bedtime.     hydrochlorothiazide  (HYDRODIURIL ) 25 MG tablet Take 1 tablet (25 mg total) by mouth daily. 90 tablet 1   losartan  (COZAAR ) 50 MG tablet Take 1 tablet (50 mg total) by mouth 2 (two) times daily. 180 tablet 1   Multiple Vitamins-Minerals (PRESERVISION AREDS 2) CAPS Take 1 capsule by mouth 2 (two) times daily.     rosuvastatin (CRESTOR) 5 MG tablet Take 5 mg by mouth every evening.      sucralfate  (CARAFATE ) 1 g tablet Take 1 tablet (1 g total) by mouth in the morning. 90 tablet 1   traZODone  (DESYREL ) 50 MG tablet Take 1 tablet (50 mg total) by mouth at bedtime. 30 tablet 1   diclofenac (VOLTAREN) 75 MG EC tablet Take 75 mg by mouth 2 (two) times daily. (Patient not taking: Reported on 10/04/2023)     fluticasone  (FLONASE ) 50 MCG/ACT nasal spray Place 2 sprays into both nostrils daily. (Patient not taking: Reported on 10/04/2023) 16 g 5   lansoprazole (PREVACID) 30 MG capsule Take 30 mg by mouth every morning. (Patient not taking: Reported on 10/04/2023)     montelukast  (SINGULAIR ) 10 MG tablet Take 1 tablet (10 mg total) by mouth at bedtime. (Patient not taking: Reported on 10/04/2023) 30 tablet 5   No current facility-administered medications on file prior to visit.  Allergies  Allergen Reactions   Cefzil [Cefprozil] Rash     Pharmacy: [facility]  Primary Insurance Name: [facility]  Best number where you can be reached: [phone]

## 2023-10-04 NOTE — Telephone Encounter (Signed)
 I called the patient and she says she has had some left lower abdominal pain since Tuesday. She has some nausea. Denies any fevers,dark or bloody stools, vomiting. She is having some pencil like thin stools. She uses [facility]. Please advise. Thanks.

## 2023-10-04 NOTE — Telephone Encounter (Signed)
 Pt came to front desk asking if we would call something into [facility] for her diverticulosis. [phone]

## 2023-10-04 NOTE — Telephone Encounter (Signed)
 Room 1 Thanks

## 2023-10-04 NOTE — Telephone Encounter (Signed)
 I made the patient aware per Gayle Kava, NP,As she has not been seen in over a year and no documented episodes of diverticulitis on imaging over the past few years, I would recommend she come in to be seen for an Urgent visit with any provider today or tomorrow to determine further interventions/recommendations Patient states understanding and will be here tomorrow to see Karna Pacas.

## 2023-10-05 ENCOUNTER — Ambulatory Visit (INDEPENDENT_AMBULATORY_CARE_PROVIDER_SITE_OTHER): Admitting: Gastroenterology

## 2023-10-05 ENCOUNTER — Encounter: Payer: Self-pay | Admitting: Gastroenterology

## 2023-10-05 VITALS — BP 170/84 | HR 77 | Temp 97.5°F | Ht 65.0 in | Wt 191.2 lb

## 2023-10-05 DIAGNOSIS — Z860101 Personal history of adenomatous and serrated colon polyps: Secondary | ICD-10-CM | POA: Diagnosis not present

## 2023-10-05 DIAGNOSIS — K5900 Constipation, unspecified: Secondary | ICD-10-CM

## 2023-10-05 DIAGNOSIS — R1032 Left lower quadrant pain: Secondary | ICD-10-CM

## 2023-10-05 DIAGNOSIS — Z860100 Personal history of colon polyps, unspecified: Secondary | ICD-10-CM | POA: Insufficient documentation

## 2023-10-05 NOTE — H&P (View-Only) (Signed)
 Gastroenterology Office Note     Primary Care Physician:  Jenelle Mis, FNP  Primary Gastroenterologist: Dr. Sammi Crick    Chief Complaint   Chief Complaint  Patient presents with   Abdominal Pain    Patient here today due to having issues with left lower abdominal pain, symptoms ongoing for the last week. She says the pain is off and on. She has occasional nausea,but denies any other current gi issues. Takes Dexlansoprazole  30 mg prn and Lansoprazole 30 mg daily, she also takes sucralfate  1 g bid.     History of Present Illness   Trista Ciocca is a delightful 78 y.o. female presenting today with a history of GERD, hyperlipidemia, hypertension, diverticulosis with diverticulitis in 2020, multiple polyps in 2023 and due for 2 year surveillance, presenting today to arrange colonoscopy and discuss LLQ discomfort.   Having LLQ discomfort for one week. Has been eating tomatoes. Not exacerbated by anything. Pain is a 4/10 when it happens. Nothing pinpoints to relieve. Pain is a few minutes and goes away. Not affecting daily activities. Occasional nausea, no vomiting. Small amounts of BM and feels unproductive. No rectal bleeding. No fever or chills. She states it's not affecting her daily life, but she wanted to get it checked out.   Has been on Benefiber in the past and ran out about 4 months ago. Bowel movements better with this in the past. No urinary symptoms.   GERD: alternates dexilant  and prevacid prn.   Last Colonoscopy: 10/31/2021 - Fourteen 3 to 8 mm polyps in the ascending colon and in the cecum, removed with a cold snare. Resected and retrieved. - Two 2 mm polyps in the transverse colon and in the ascending colon, removed with a cold snare. Resected and retrieved. - One 5 mm polyp in the descending colon, removed with a cold snare. Resected and retrieved. - Tortuous colon. - Diverticulosis in the sigmoid colon and in the descending colon. - Non-bleeding internal  hemorrhoids.   Path: FINAL MICROSCOPIC DIAGNOSIS:  A.   COLON, CECAL, ASCENDING, POLYPECTOMY:  Findings consistent with tubular adenoma.  No high-grade dysplasia or malignancy is seen.  B.   COLON, TRANSVERSE, POLYPECTOMY:  Findings consistent with tubular adenoma.  No high-grade dysplasia or malignancy is seen.  C.   COLON, DESCENDING, TRANSVERSE, POLYPECTOMY:  Findings consistent with tubular adenoma.  No high-grade dysplasia or malignancy is seen.    Recommended to repeat in 2 years   FH colon polyps: daughter No FH colon cancer  Past Medical History:  Diagnosis Date   Anxiety    Arthritis    "all over; mostly knees and shoulders"  (06/04/2015)    Cataract    Family history of adverse reaction to anesthesia    Patients mother had bad N/V after anesthesia   GERD (gastroesophageal reflux disease)    High cholesterol    Hypertension    Primary localized osteoarthritis of left knee     Past Surgical History:  Procedure Laterality Date   APPENDECTOMY     CHOLECYSTECTOMY OPEN     pain   COLONOSCOPY N/A 04/22/2016   Procedure: COLONOSCOPY;  Surgeon: Ruby Corporal, MD;  [facility];  Service: Endoscopy;  Laterality: N/A;  1200   COLONOSCOPY W/ POLYPECTOMY     COLONOSCOPY WITH PROPOFOL  N/A 10/31/2021   Procedure: COLONOSCOPY WITH PROPOFOL ;  Surgeon: Urban Garden, MD;  [facility];  Service: Gastroenterology;  Laterality: N/A;  935   ESOPHAGOGASTRODUODENOSCOPY  N/A 09/08/2017   Procedure: ESOPHAGOGASTRODUODENOSCOPY (EGD) WITH ESOPHAGEAL DILATION;  Surgeon: Ruby Corporal, MD;  [facility];  Service: Endoscopy;  Laterality: N/A;  3:00   JOINT REPLACEMENT     KNEE ARTHROSCOPY W/ MENISCECTOMY Left    KNEE SURGERY Left 1990s   MASS EXCISION N/A 07/27/2016   Procedure: EXCISION OF SEBACEOUS CYST ON NECK;  Surgeon: Reynold Caves, MD;  Location: [facility];  Service: ENT;  Laterality: N/A;   OOPHORECTOMY     for a cyst ?  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     Dexlansoprazole  30 MG capsule DR Take 30 mg by mouth daily.     fluticasone  (FLONASE ) 50 MCG/ACT nasal spray Place 2 sprays into both nostrils daily. 16 g 5   gabapentin (NEURONTIN) 300 MG capsule Take 300 mg by mouth at bedtime.     hydrochlorothiazide  (HYDRODIURIL ) 25 MG tablet Take 1 tablet (25 mg total) by mouth daily. 90 tablet 1   lansoprazole (PREVACID) 30 MG capsule Take 30 mg by mouth every morning.     losartan  (COZAAR ) 50 MG tablet Take 1 tablet (50 mg total) by mouth 2 (two) times daily. 180 tablet 1   Multiple Vitamins-Minerals (PRESERVISION AREDS 2) CAPS Take 1 capsule by mouth 2 (two) times daily.     rosuvastatin  (CRESTOR ) 5 MG tablet Take 5 mg by mouth every evening.     sucralfate  (CARAFATE ) 1 g tablet Take 1 tablet (1 g total) by mouth in the morning. 90 tablet 1   traZODone  (DESYREL ) 50 MG tablet Take 1 tablet (50 mg total) by mouth at bedtime. 30 tablet 1   No current facility-administered medications for this visit.    Allergies as of 10/05/2023 - Review Complete 10/05/2023  Allergen Reaction Noted   Cefzil [cefprozil] Rash 09/01/2017    Family History  Problem Relation Age of Onset   Heart disease Mother    Diabetes Mellitus II Mother    Kidney disease Mother    Cancer Mother    Hypertension Father    Cancer Father    Hypertension Sister    Hypertension Brother    Colon cancer Neg Hx     Social History    Socioeconomic History   Marital status: Married    Spouse name: Hydrologist   Number of children: 6   Years of education: Not on file   Highest education level: 10th grade  Occupational History   Not on file  Tobacco Use   Smoking status: Never   Smokeless tobacco: Never  Vaping Use   Vaping status: Never Used  Substance and Sexual Activity   Alcohol use: No    Alcohol/week: 0.0 standard drinks of alcohol   Drug use: No   Sexual activity: Not Currently  Other Topics Concern   Not on file  Social History Narrative   Lives with husband, Juda Lajeunesse.  [phone] cell   Social Drivers of Health   Financial Resource Strain: Low Risk  (  05/25/2023)   Overall Financial Resource Strain (CARDIA)    Difficulty of Paying Living Expenses: Not hard at all  Food Insecurity: No Food Insecurity (05/25/2023)   Hunger Vital Sign    Worried About Running Out of Food in the Last Year: Never true    Ran Out of Food in the Last Year: Never true  Transportation Needs: No Transportation Needs (05/25/2023)   PRAPARE - Administrator, Civil Service (Medical): No    Lack of Transportation (Non-Medical): No  Physical Activity: Insufficiently Active (05/25/2023)   Exercise Vital Sign    Days of Exercise per Week: 1 day    Minutes of Exercise per Session: 20 min  Stress: Stress Concern Present (05/25/2023)   [employer] of Occupational Health - Occupational Stress Questionnaire    Feeling of Stress : To some extent  Social Connections: Moderately Integrated (05/25/2023)   Social Connection and Isolation Panel [NHANES]    Frequency of Communication with Friends and Family: More than three times a week    Frequency of Social Gatherings with Friends and Family: Twice a week    Attends Religious Services: More than 4 times per year    Active Member of [employer] or Organizations: No    Attends Engineer, structural: Not on file    Marital Status: Married  Intimate Partner  Violence: Unknown (09/02/2021)   Received from [employer], [facility]   HITS    Physically Hurt: Not on file    Insult or Talk Down To: Not on file    Threaten Physical Harm: Not on file    Scream or Curse: Not on file     Review of Systems   Gen: Denies any fever, chills, fatigue, weight loss, lack of appetite.  CV: Denies chest pain, heart palpitations, peripheral edema, syncope.  Resp: Denies shortness of breath at rest or with exertion. Denies wheezing or cough.  GI: Denies dysphagia or odynophagia. Denies jaundice, hematemesis, fecal incontinence. GU : Denies urinary burning, urinary frequency, urinary hesitancy MS: Denies joint pain, muscle weakness, cramps, or limitation of movement.  Derm: Denies rash, itching, dry skin Psych: Denies depression, anxiety, memory loss, and confusion Heme: Denies bruising, bleeding, and enlarged lymph nodes.   Physical Exam   BP (!) 170/84 (BP Location: Left Arm, Patient Position: Sitting, Cuff Size: Normal)   Pulse 77   Temp (!) 97.5 F (36.4 C) (Temporal)   Ht 5\' 5"  (1.651 m)   Wt 191 lb 3.2 oz (86.7 kg)   BMI 31.82 kg/m  General:   Alert and oriented. Pleasant and cooperative. Well-nourished and well-developed.  Head:  Normocephalic and atraumatic. Eyes:  Without icterus Abdomen:  +BS, soft, non-tender and non-distended. No HSM noted. No guarding or rebound. No masses appreciated.  Rectal:  Deferred  Msk:  Symmetrical without gross deformities. Normal posture. Extremities:  Without edema. Neurologic:  Alert and  oriented x4;  grossly normal neurologically. Skin:  Intact without significant lesions or rashes. Psych:  Alert and cooperative. Normal mood and affect.   Assessment   Rey Fors is a delightful 78 y.o. female presenting today with a history of GERD, hyperlipidemia, hypertension, diverticulosis with diverticulitis in 2020, multiple polyps in 2023 and due for 2 year surveillance, presenting today to arrange colonoscopy  and discuss LLQ discomfort.   LLQ discomfort: for the past week without exacerbating factors, mild, lasting a few minutes and fleeting, along with incomplete bowel movements with known history of constipation. Physical exam benign. Could  simply be dealing with underlying constipation, as she did have good results in the past with supplemental fiber daily but has stopped this recently. Will go ahead and start one capful of Miralax  daily for the net few days to get things moving. If improved, add back Benefiber. If no improvement, may need CT. However, with mild symptoms, it is less likely dealing with diverticulitis.   History of adenomas: multiple in 2023. Surveillance due now.     PLAN    Start Miralax  daily. If improved, add back Benefiber If no improvement in symptoms, call office, as will need to postpone colonoscopy and possibly pursue imaging Proceed with colonoscopy by Dr. Sammi Crick in near future: the risks, benefits, and alternatives have been discussed with the patient in detail. The patient states understanding and desires to proceed.     Delman Ferns, PhD, ANP-BC [facility] Gastroenterology

## 2023-10-05 NOTE — Patient Instructions (Signed)
 We are arranging a colonoscopy in the near future with Dr. Sammi Crick!  For now, let's take 1 capful of Miralax  each morning to get things moving. When you feel improved, you can ad back Benefiber in the next few days.   Please message on MyChart or call if you have any worsening symptoms!  It was a pleasure to see you today. I want to create trusting relationships with patients and provide genuine, compassionate, and quality care. I truly value your feedback, so please be on the lookout for a survey regarding your visit with me today. I appreciate your time in completing this!         Delman Ferns, PhD, ANP-BC [facility] Gastroenterology

## 2023-10-05 NOTE — Progress Notes (Addendum)
 Gastroenterology Office Note     Primary Care Physician:  Jenelle Mis, FNP  Primary Gastroenterologist: Dr. Sammi Crick    Chief Complaint   Chief Complaint  Patient presents with   Abdominal Pain    Patient here today due to having issues with left lower abdominal pain, symptoms ongoing for the last week. She says the pain is off and on. She has occasional nausea,but denies any other current gi issues. Takes Dexlansoprazole  30 mg prn and Lansoprazole 30 mg daily, she also takes sucralfate  1 g bid.     History of Present Illness   [name] is a delightful 78 y.o. female presenting today with a history of GERD, hyperlipidemia, hypertension, diverticulosis with diverticulitis in 2020, multiple polyps in 2023 and due for 2 year surveillance, presenting today to arrange colonoscopy and discuss LLQ discomfort.   Having LLQ discomfort for one week. Has been eating tomatoes. Not exacerbated by anything. Pain is a 4/10 when it happens. Nothing pinpoints to relieve. Pain is a few minutes and goes away. Not affecting daily activities. Occasional nausea, no vomiting. Small amounts of BM and feels unproductive. No rectal bleeding. No fever or chills. She states it's not affecting her daily life, but she wanted to get it checked out.   Has been on Benefiber in the past and ran out about 4 months ago. Bowel movements better with this in the past. No urinary symptoms.   GERD: alternates dexilant  and prevacid prn.   Last Colonoscopy: 10/31/2021 - Fourteen 3 to 8 mm polyps in the ascending colon and in the cecum, removed with a cold snare. Resected and retrieved. - Two 2 mm polyps in the transverse colon and in the ascending colon, removed with a cold snare. Resected and retrieved. - One 5 mm polyp in the descending colon, removed with a cold snare. Resected and retrieved. - Tortuous colon. - Diverticulosis in the sigmoid colon and in the descending colon. - Non-bleeding internal  hemorrhoids.   Path: FINAL MICROSCOPIC DIAGNOSIS:  A.   COLON, CECAL, ASCENDING, POLYPECTOMY:  Findings consistent with tubular adenoma.  No high-grade dysplasia or malignancy is seen.  B.   COLON, TRANSVERSE, POLYPECTOMY:  Findings consistent with tubular adenoma.  No high-grade dysplasia or malignancy is seen.  C.   COLON, DESCENDING, TRANSVERSE, POLYPECTOMY:  Findings consistent with tubular adenoma.  No high-grade dysplasia or malignancy is seen.    Recommended to repeat in 2 years   FH colon polyps: daughter No FH colon cancer  Past Medical History:  Diagnosis Date   Anxiety    Arthritis    "all over; mostly knees and shoulders"  (06/04/2015)    Cataract    Family history of adverse reaction to anesthesia    Patients mother had bad N/V after anesthesia   GERD (gastroesophageal reflux disease)    High cholesterol    Hypertension    Primary localized osteoarthritis of left knee     Past Surgical History:  Procedure Laterality Date   APPENDECTOMY     CHOLECYSTECTOMY OPEN     pain   COLONOSCOPY N/A 04/22/2016   Procedure: COLONOSCOPY;  Surgeon: Ruby Corporal, MD;  [facility];  Service: Endoscopy;  Laterality: N/A;  1200   COLONOSCOPY W/ POLYPECTOMY     COLONOSCOPY WITH PROPOFOL  N/A 10/31/2021   Procedure: COLONOSCOPY WITH PROPOFOL ;  Surgeon: Urban Garden, MD;  [facility];  Service: Gastroenterology;  Laterality: N/A;  935   ESOPHAGOGASTRODUODENOSCOPY  N/A 09/08/2017   Procedure: ESOPHAGOGASTRODUODENOSCOPY (EGD) WITH ESOPHAGEAL DILATION;  Surgeon: Ruby Corporal, MD;  [facility];  Service: Endoscopy;  Laterality: N/A;  3:00   JOINT REPLACEMENT     KNEE ARTHROSCOPY W/ MENISCECTOMY Left    KNEE SURGERY Left 1990s   MASS EXCISION N/A 07/27/2016   Procedure: EXCISION OF SEBACEOUS CYST ON NECK;  Surgeon: Reynold Caves, MD;  Location: [facility];  Service: ENT;  Laterality: N/A;   OOPHORECTOMY     for a cyst ?  side   POLYPECTOMY  10/31/2021   Procedure: POLYPECTOMY;  Surgeon: Urban Garden, MD;  [facility];  Service: Gastroenterology;;   TOTAL KNEE ARTHROPLASTY Left 06/03/2015   TOTAL KNEE ARTHROPLASTY Left 06/03/2015   Procedure: TOTAL LEFT KNEE ARTHROPLASTY;  Surgeon: Elly Habermann, MD;  Location: [facility];  Service: Orthopedics;  Laterality: Left;   TUBAL LIGATION      Current Outpatient Medications  Medication Sig Dispense Refill   Cholecalciferol (VITAMIN D-3) 5000 units TABS Take 5,000 Units by mouth in the morning.     Dexlansoprazole  30 MG capsule DR Take 30 mg by mouth daily.     fluticasone  (FLONASE ) 50 MCG/ACT nasal spray Place 2 sprays into both nostrils daily. 16 g 5   gabapentin (NEURONTIN) 300 MG capsule Take 300 mg by mouth at bedtime.     hydrochlorothiazide  (HYDRODIURIL ) 25 MG tablet Take 1 tablet (25 mg total) by mouth daily. 90 tablet 1   lansoprazole (PREVACID) 30 MG capsule Take 30 mg by mouth every morning.     losartan  (COZAAR ) 50 MG tablet Take 1 tablet (50 mg total) by mouth 2 (two) times daily. 180 tablet 1   Multiple Vitamins-Minerals (PRESERVISION AREDS 2) CAPS Take 1 capsule by mouth 2 (two) times daily.     rosuvastatin  (CRESTOR ) 5 MG tablet Take 5 mg by mouth every evening.     sucralfate  (CARAFATE ) 1 g tablet Take 1 tablet (1 g total) by mouth in the morning. 90 tablet 1   traZODone  (DESYREL ) 50 MG tablet Take 1 tablet (50 mg total) by mouth at bedtime. 30 tablet 1   No current facility-administered medications for this visit.    Allergies as of 10/05/2023 - Review Complete 10/05/2023  Allergen Reaction Noted   Cefzil [cefprozil] Rash 09/01/2017    Family History  Problem Relation Age of Onset   Heart disease Mother    Diabetes Mellitus II Mother    Kidney disease Mother    Cancer Mother    Hypertension Father    Cancer Father    Hypertension Sister    Hypertension Brother    Colon cancer Neg Hx     Social History    Socioeconomic History   Marital status: Married    Spouse name: Hydrologist   Number of children: 6   Years of education: Not on file   Highest education level: 10th grade  Occupational History   Not on file  Tobacco Use   Smoking status: Never   Smokeless tobacco: Never  Vaping Use   Vaping status: Never Used  Substance and Sexual Activity   Alcohol use: No    Alcohol/week: 0.0 standard drinks of alcohol   Drug use: No   Sexual activity: Not Currently  Other Topics Concern   Not on file  Social History Narrative   Lives with husband, [name].  [phone] cell   Social Drivers of Health   Financial Resource Strain: Low Risk  (  05/25/2023)   Overall Financial Resource Strain (CARDIA)    Difficulty of Paying Living Expenses: Not hard at all  Food Insecurity: No Food Insecurity (05/25/2023)   Hunger Vital Sign    Worried About Running Out of Food in the Last Year: Never true    Ran Out of Food in the Last Year: Never true  Transportation Needs: No Transportation Needs (05/25/2023)   PRAPARE - Administrator, Civil Service (Medical): No    Lack of Transportation (Non-Medical): No  Physical Activity: Insufficiently Active (05/25/2023)   Exercise Vital Sign    Days of Exercise per Week: 1 day    Minutes of Exercise per Session: 20 min  Stress: Stress Concern Present (05/25/2023)   [employer] of Occupational Health - Occupational Stress Questionnaire    Feeling of Stress : To some extent  Social Connections: Moderately Integrated (05/25/2023)   Social Connection and Isolation Panel [NHANES]    Frequency of Communication with Friends and Family: More than three times a week    Frequency of Social Gatherings with Friends and Family: Twice a week    Attends Religious Services: More than 4 times per year    Active Member of [employer] or Organizations: No    Attends Engineer, structural: Not on file    Marital Status: Married  Intimate Partner  Violence: Unknown (09/02/2021)   Received from [employer], [facility]   HITS    Physically Hurt: Not on file    Insult or Talk Down To: Not on file    Threaten Physical Harm: Not on file    Scream or Curse: Not on file     Review of Systems   Gen: Denies any fever, chills, fatigue, weight loss, lack of appetite.  CV: Denies chest pain, heart palpitations, peripheral edema, syncope.  Resp: Denies shortness of breath at rest or with exertion. Denies wheezing or cough.  GI: Denies dysphagia or odynophagia. Denies jaundice, hematemesis, fecal incontinence. GU : Denies urinary burning, urinary frequency, urinary hesitancy MS: Denies joint pain, muscle weakness, cramps, or limitation of movement.  Derm: Denies rash, itching, dry skin Psych: Denies depression, anxiety, memory loss, and confusion Heme: Denies bruising, bleeding, and enlarged lymph nodes.   Physical Exam   BP (!) 170/84 (BP Location: Left Arm, Patient Position: Sitting, Cuff Size: Normal)   Pulse 77   Temp (!) 97.5 F (36.4 C) (Temporal)   Ht 5\' 5"  (1.651 m)   Wt 191 lb 3.2 oz (86.7 kg)   BMI 31.82 kg/m  General:   Alert and oriented. Pleasant and cooperative. Well-nourished and well-developed.  Head:  Normocephalic and atraumatic. Eyes:  Without icterus Abdomen:  +BS, soft, non-tender and non-distended. No HSM noted. No guarding or rebound. No masses appreciated.  Rectal:  Deferred  Msk:  Symmetrical without gross deformities. Normal posture. Extremities:  Without edema. Neurologic:  Alert and  oriented x4;  grossly normal neurologically. Skin:  Intact without significant lesions or rashes. Psych:  Alert and cooperative. Normal mood and affect.   Assessment   Mychal Durio is a delightful 78 y.o. female presenting today with a history of GERD, hyperlipidemia, hypertension, diverticulosis with diverticulitis in 2020, multiple polyps in 2023 and due for 2 year surveillance, presenting today to arrange colonoscopy  and discuss LLQ discomfort.   LLQ discomfort: for the past week without exacerbating factors, mild, lasting a few minutes and fleeting, along with incomplete bowel movements with known history of constipation. Physical exam benign. Could  simply be dealing with underlying constipation, as she did have good results in the past with supplemental fiber daily but has stopped this recently. Will go ahead and start one capful of Miralax  daily for the net few days to get things moving. If improved, add back Benefiber. If no improvement, may need CT. However, with mild symptoms, it is less likely dealing with diverticulitis.   History of adenomas: multiple in 2023. Surveillance due now.     PLAN    Start Miralax  daily. If improved, add back Benefiber If no improvement in symptoms, call office, as will need to postpone colonoscopy and possibly pursue imaging Proceed with colonoscopy by Dr. Sammi Crick in near future: the risks, benefits, and alternatives have been discussed with the patient in detail. The patient states understanding and desires to proceed.     Delman Ferns, PhD, ANP-BC [facility] Gastroenterology   I have reviewed the note and agree with the APP's assessment as described in this progress note  Samantha Cress, MD Gastroenterology and Hepatology [facility] Gastroenterology

## 2023-10-08 MED ORDER — PEG 3350-KCL-NA BICARB-NACL 420 G PO SOLR: 4000.0000 mL | Freq: Once | ORAL | 0 refills | Status: AC

## 2023-10-08 NOTE — Telephone Encounter (Signed)
 Pt contacted and TCS scheduled. Pt was seen in office on 10/05/23 by Peola Boyden. No pa needed per insurance. Instructions will be mailed to pt. Prep sent to pharmacy. Orders were placed in previous telephone encounter by mistake.

## 2023-10-08 NOTE — Addendum Note (Signed)
 Addended by: Maisey Deandrade on: 10/08/2023 11:31 AM   Modules accepted: Orders

## 2023-10-12 ENCOUNTER — Encounter: Payer: Self-pay | Admitting: Gastroenterology

## 2023-10-22 ENCOUNTER — Other Ambulatory Visit: Payer: Self-pay | Admitting: Family Medicine

## 2023-10-26 ENCOUNTER — Ambulatory Visit (INDEPENDENT_AMBULATORY_CARE_PROVIDER_SITE_OTHER): Payer: Medicare Other | Admitting: Family Medicine

## 2023-10-26 ENCOUNTER — Encounter: Payer: Self-pay | Admitting: Family Medicine

## 2023-10-26 VITALS — BP 134/65 | HR 78 | Ht 65.0 in | Wt 193.4 lb

## 2023-10-26 DIAGNOSIS — I10 Essential (primary) hypertension: Secondary | ICD-10-CM

## 2023-10-26 DIAGNOSIS — K219 Gastro-esophageal reflux disease without esophagitis: Secondary | ICD-10-CM | POA: Diagnosis not present

## 2023-10-26 DIAGNOSIS — E782 Mixed hyperlipidemia: Secondary | ICD-10-CM | POA: Diagnosis not present

## 2023-10-26 MED ORDER — ROSUVASTATIN CALCIUM 5 MG PO TABS: 5.0000 mg | ORAL_TABLET | Freq: Every evening | ORAL | 1 refills | Status: DC

## 2023-10-26 MED ORDER — SUCRALFATE 1 G PO TABS: 1.0000 g | ORAL_TABLET | Freq: Two times a day (BID) | ORAL | 1 refills | Status: DC

## 2023-10-26 MED ORDER — TRAZODONE HCL 50 MG PO TABS: 50.0000 mg | ORAL_TABLET | Freq: Every day | ORAL | 1 refills | Status: DC

## 2023-10-26 NOTE — Assessment & Plan Note (Signed)
 Well controlled. continue hydrochlorothiazide  25mg  daily and Losartan  50mg  BID. Continue to monitor at home. Recommend heart healthy diet such as Mediterranean diet with whole grains, fruits, vegetable, fish, lean meats, nuts, and olive oil. Limit salt. Encouraged moderate walking, 3-5 times/week for 30-50 minutes each session. Aim for at least 150 minutes.week. Goal should be pace of 3 miles/hours, or walking 1.5 miles in 30 minutes. Avoid tobacco products. Avoid excess alcohol. Take medications as prescribed and bring medications and blood pressure log with cuff to each office visit. Seek medical care for chest pain, palpitations, shortness of breath with exertion, dizziness/lightheadedness, vision changes, recurrent headaches, or swelling of extremities. 6 month follow up.

## 2023-10-26 NOTE — Assessment & Plan Note (Signed)
 Well controlled on current regimen. She alternates between Lansoprazole and Dexlansoprazole  due to cost.  Anti-reflux measures such as raising the head of the bed, avoiding tight clothing or belts, avoiding eating late at night and not lying down shortly after mealtime and achieving weight loss are discussed. Avoid ASA, NSAID's, caffeine, peppermints, alcohol and tobacco. She should alert me if there are persistent symptoms, dysphagia, weight loss or GI bleeding. Follow up as needed.

## 2023-10-26 NOTE — Progress Notes (Signed)
 Subjective:  HPI: [name] is a 78 y.o. female presenting on 10/26/2023 for Medical Management of Chronic Issues (3 month HTN f/u )   HPI Patient is in today for blood pressure follow up. Home readings have been well controlled. No new concerns or issues.   HYPERTENSION without Chronic Kidney Disease Hypertension status: controlled  Satisfied with current treatment? yes Duration of hypertension: chronic BP monitoring frequency:  a few times a week BP range: <140/90 BP medication side effects:  no Medication compliance: excellent compliance Previous BP meds: hydrochlorothiazide , losartan  Aspirin: no Recurrent headaches: no Visual changes: no Palpitations: no Dyspnea: no Chest pain: no Lower extremity edema: no Dizzy/lightheaded: no  The 10-year ASCVD risk score (Arnett DK, et al., 2019) is: 34.1%   Values used to calculate the score:     Age: 7 years     Sex: Female     Is Non-Hispanic African American: No     Diabetic: No     Tobacco smoker: No     Systolic Blood Pressure: 134 mmHg     Is BP treated: Yes     HDL Cholesterol: 58 mg/dL     Total Cholesterol: 168 mg/dL   Review of Systems  All other systems reviewed and are negative.   Relevant past medical history reviewed and updated as indicated.   Past Medical History:  Diagnosis Date   Anxiety    Arthritis    "all over; mostly knees and shoulders"  (06/04/2015)    Cataract    Family history of adverse reaction to anesthesia    Patients mother had bad N/V after anesthesia   GERD (gastroesophageal reflux disease)    High cholesterol    Hypertension    Primary localized osteoarthritis of left knee      Past Surgical History:  Procedure Laterality Date   APPENDECTOMY     CHOLECYSTECTOMY OPEN     pain   COLONOSCOPY N/A 04/22/2016   Procedure: COLONOSCOPY;  Surgeon: Ruby Corporal, MD;  Location: [facility];  Service: Endoscopy;  Laterality: N/A;  1200   COLONOSCOPY W/ POLYPECTOMY     COLONOSCOPY  WITH PROPOFOL  N/A 10/31/2021   Procedure: COLONOSCOPY WITH PROPOFOL ;  Surgeon: Urban Garden, MD;  Location: [facility];  Service: Gastroenterology;  Laterality: N/A;  935   ESOPHAGOGASTRODUODENOSCOPY N/A 09/08/2017   Procedure: ESOPHAGOGASTRODUODENOSCOPY (EGD) WITH ESOPHAGEAL DILATION;  Surgeon: Ruby Corporal, MD;  Location: [facility];  Service: Endoscopy;  Laterality: N/A;  3:00   JOINT REPLACEMENT     KNEE ARTHROSCOPY W/ MENISCECTOMY Left    KNEE SURGERY Left 1990s   MASS EXCISION N/A 07/27/2016   Procedure: EXCISION OF SEBACEOUS CYST ON NECK;  Surgeon: Reynold Caves, MD;  Location: [facility];  Service: ENT;  Laterality: N/A;   OOPHORECTOMY     for a cyst ? side   POLYPECTOMY  10/31/2021   Procedure: POLYPECTOMY;  Surgeon: Urban Garden, MD;  Location: [facility];  Service: Gastroenterology;;   TOTAL KNEE ARTHROPLASTY Left 06/03/2015   TOTAL KNEE ARTHROPLASTY Left 06/03/2015   Procedure: TOTAL LEFT KNEE ARTHROPLASTY;  Surgeon: Elly Habermann, MD;  Location[facility];  Service: Orthopedics;  Laterality: Left;   TUBAL LIGATION      Allergies and medications reviewed and updated.   Current Outpatient Medications:    Cholecalciferol (VITAMIN D-3) 5000 units TABS, Take 5,000 Units by mouth in the morning., Disp: , Rfl:    Dexlansoprazole  30 MG capsule DR,  Take 30 mg by mouth daily., Disp: , Rfl:    fluticasone  (FLONASE ) 50 MCG/ACT nasal spray, Place 2 sprays into both nostrils daily., Disp: 16 g, Rfl: 5   gabapentin (NEURONTIN) 300 MG capsule, Take 300 mg by mouth at bedtime., Disp: , Rfl:    hydrochlorothiazide  (HYDRODIURIL ) 25 MG tablet, Take 1 tablet (25 mg total) by mouth daily., Disp: 90 tablet, Rfl: 1   lansoprazole (PREVACID) 30 MG capsule, Take 30 mg by mouth every morning., Disp: , Rfl:    losartan  (COZAAR ) 50 MG tablet, Take 1 tablet (50 mg total) by mouth 2 (two) times daily., Disp: 180 tablet, Rfl: 1   Multiple Vitamins-Minerals (PRESERVISION  AREDS 2) CAPS, Take 1 capsule by mouth 2 (two) times daily., Disp: , Rfl:    rosuvastatin (CRESTOR) 5 MG tablet, Take 5 mg by mouth every evening., Disp: , Rfl:    sucralfate  (CARAFATE ) 1 g tablet, Take 1 tablet (1 g total) by mouth in the morning., Disp: 90 tablet, Rfl: 1   traZODone  (DESYREL ) 50 MG tablet, Take 1 tablet (50 mg total) by mouth at bedtime., Disp: 30 tablet, Rfl: 1  Allergies  Allergen Reactions   Cefzil [Cefprozil] Rash    Objective:   BP 134/65   Pulse 78   Ht 5\' 5"  (1.651 m)   Wt 193 lb 6.4 oz (87.7 kg)   SpO2 95%   BMI 32.18 kg/m      10/26/2023    7:56 AM 10/05/2023    3:31 PM 10/05/2023    3:28 PM  Vitals with BMI  Height 5\' 5"   5\' 5"   Weight 193 lbs 6 oz  191 lbs 3 oz  BMI 32.18  31.82  Systolic 134 170 161  Diastolic 65 84 77  Pulse 78 77 81     Physical Exam Vitals and nursing note reviewed.  Constitutional:      Appearance: Normal appearance. She is normal weight.  HENT:     Head: Normocephalic and atraumatic.  Cardiovascular:     Rate and Rhythm: Normal rate and regular rhythm.     Pulses: Normal pulses.     Heart sounds: Normal heart sounds.  Pulmonary:     Effort: Pulmonary effort is normal.     Breath sounds: Normal breath sounds.  Skin:    General: Skin is warm and dry.  Neurological:     General: No focal deficit present.     Mental Status: She is alert and oriented to person, place, and time. Mental status is at baseline.  Psychiatric:        Mood and Affect: Mood normal.        Behavior: Behavior normal.        Thought Content: Thought content normal.        Judgment: Judgment normal.     Assessment & Plan:  Primary hypertension Assessment & Plan: Well controlled. continue hydrochlorothiazide  25mg  daily and Losartan  50mg  BID. Continue to monitor at home. Recommend heart healthy diet such as Mediterranean diet with whole grains, fruits, vegetable, fish, lean meats, nuts, and olive oil. Limit salt. Encouraged moderate walking,  3-5 times/week for 30-50 minutes each session. Aim for at least 150 minutes.week. Goal should be pace of 3 miles/hours, or walking 1.5 miles in 30 minutes. Avoid tobacco products. Avoid excess alcohol. Take medications as prescribed and bring medications and blood pressure log with cuff to each office visit. Seek medical care for chest pain, palpitations, shortness of breath with exertion, dizziness/lightheadedness, vision  changes, recurrent headaches, or swelling of extremities. 6 month follow up.   Gastroesophageal reflux disease without esophagitis Assessment & Plan: Well controlled on current regimen. She alternates between Lansoprazole and Dexlansoprazole  due to cost.  Anti-reflux measures such as raising the head of the bed, avoiding tight clothing or belts, avoiding eating late at night and not lying down shortly after mealtime and achieving weight loss are discussed. Avoid ASA, NSAID's, caffeine, peppermints, alcohol and tobacco. She should alert me if there are persistent symptoms, dysphagia, weight loss or GI bleeding. Follow up as needed.   Mixed hyperlipidemia Assessment & Plan: Continue Crestor 5mg  daily. LDL 84. I recommend consuming a heart healthy diet such as Mediterranean diet or DASH diet with whole grains, fruits, vegetable, fish, lean meats, nuts, and olive oil. Limit sweets and processed foods. I also encourage moderate intensity exercise 150 minutes weekly. This is 3-5 times weekly for 30-50 minutes each session. Goal should be pace of 3 miles/hours, or walking 1.5 miles in 30 minutes. The 10-year ASCVD risk score (Arnett DK, et al., 2019) is: 34.1%       Follow up plan: Return in about 6 months (around 04/27/2024) for chronic follow-up with labs 1 week prior.  Jenelle Mis, FNP

## 2023-10-26 NOTE — Assessment & Plan Note (Signed)
 Continue Crestor 5mg  daily. LDL 84. I recommend consuming a heart healthy diet such as Mediterranean diet or DASH diet with whole grains, fruits, vegetable, fish, lean meats, nuts, and olive oil. Limit sweets and processed foods. I also encourage moderate intensity exercise 150 minutes weekly. This is 3-5 times weekly for 30-50 minutes each session. Goal should be pace of 3 miles/hours, or walking 1.5 miles in 30 minutes. The 10-year ASCVD risk score (Arnett DK, et al., 2019) is: 34.1%

## 2023-10-29 ENCOUNTER — Other Ambulatory Visit (HOSPITAL_COMMUNITY)
Admission: RE | Admit: 2023-10-29 | Discharge: 2023-10-29 | Disposition: A | Discharge status: Home / Self Care | Source: Ambulatory Visit | Attending: Gastroenterology | Admitting: Gastroenterology

## 2023-10-29 DIAGNOSIS — K5900 Constipation, unspecified: Secondary | ICD-10-CM | POA: Diagnosis not present

## 2023-10-29 LAB — BASIC METABOLIC PANEL WITH GFR
Anion gap: 12 (ref 5–15)
BUN: 15 mg/dL (ref 8–23)
CO2: 25 mmol/L (ref 22–32)
Calcium: 9.2 mg/dL (ref 8.9–10.3)
Chloride: 101 mmol/L (ref 98–111)
Creatinine, Ser: 0.87 mg/dL (ref 0.44–1.00)
GFR, Estimated: >60 mL/min (ref >60)
Glucose, Bld: 99 mg/dL (ref 70–99)
Potassium: 3.6 mmol/L (ref 3.5–5.1)
Sodium: 138 mmol/L (ref 135–145)

## 2023-11-03 ENCOUNTER — Encounter (INDEPENDENT_AMBULATORY_CARE_PROVIDER_SITE_OTHER): Payer: Self-pay | Admitting: *Deleted

## 2023-11-03 ENCOUNTER — Ambulatory Visit (HOSPITAL_COMMUNITY)
Admission: RE | Admit: 2023-11-03 | Discharge: 2023-11-03 | Disposition: A | Discharge status: Home / Self Care | Attending: Gastroenterology | Admitting: Gastroenterology

## 2023-11-03 ENCOUNTER — Other Ambulatory Visit: Payer: Self-pay

## 2023-11-03 ENCOUNTER — Ambulatory Visit (HOSPITAL_COMMUNITY): Admitting: Anesthesiology

## 2023-11-03 ENCOUNTER — Encounter (HOSPITAL_COMMUNITY): Payer: Self-pay | Admitting: Gastroenterology

## 2023-11-03 ENCOUNTER — Encounter (HOSPITAL_COMMUNITY)
Admission: RE | Disposition: A | Discharge status: Home / Self Care | Payer: Self-pay | Source: Home / Self Care | Attending: Gastroenterology

## 2023-11-03 DIAGNOSIS — D122 Benign neoplasm of ascending colon: Secondary | ICD-10-CM | POA: Diagnosis not present

## 2023-11-03 DIAGNOSIS — E7800 Pure hypercholesterolemia, unspecified: Secondary | ICD-10-CM | POA: Insufficient documentation

## 2023-11-03 DIAGNOSIS — K219 Gastro-esophageal reflux disease without esophagitis: Secondary | ICD-10-CM | POA: Insufficient documentation

## 2023-11-03 DIAGNOSIS — Z83719 Family history of colon polyps, unspecified: Secondary | ICD-10-CM | POA: Diagnosis not present

## 2023-11-03 DIAGNOSIS — K635 Polyp of colon: Secondary | ICD-10-CM

## 2023-11-03 DIAGNOSIS — D123 Benign neoplasm of transverse colon: Secondary | ICD-10-CM | POA: Insufficient documentation

## 2023-11-03 DIAGNOSIS — K648 Other hemorrhoids: Secondary | ICD-10-CM | POA: Diagnosis not present

## 2023-11-03 DIAGNOSIS — Z79899 Other long term (current) drug therapy: Secondary | ICD-10-CM | POA: Diagnosis not present

## 2023-11-03 DIAGNOSIS — D124 Benign neoplasm of descending colon: Secondary | ICD-10-CM | POA: Insufficient documentation

## 2023-11-03 DIAGNOSIS — K649 Unspecified hemorrhoids: Secondary | ICD-10-CM

## 2023-11-03 DIAGNOSIS — D120 Benign neoplasm of cecum: Secondary | ICD-10-CM | POA: Diagnosis not present

## 2023-11-03 DIAGNOSIS — K5730 Diverticulosis of large intestine without perforation or abscess without bleeding: Secondary | ICD-10-CM | POA: Diagnosis not present

## 2023-11-03 DIAGNOSIS — I10 Essential (primary) hypertension: Secondary | ICD-10-CM | POA: Insufficient documentation

## 2023-11-03 DIAGNOSIS — K5900 Constipation, unspecified: Secondary | ICD-10-CM | POA: Diagnosis not present

## 2023-11-03 LAB — HM COLONOSCOPY

## 2023-11-03 SURGERY — COLONOSCOPY
Anesthesia: General

## 2023-11-03 MED ORDER — PHENYLEPHRINE 80 MCG/ML (10ML) SYRINGE FOR IV PUSH (FOR BLOOD PRESSURE SUPPORT)
PREFILLED_SYRINGE | INTRAVENOUS | Status: AC
  Filled 2023-11-03: qty 10

## 2023-11-03 MED ORDER — PROPOFOL 10 MG/ML IV BOLUS
INTRAVENOUS | Status: DC | PRN
  Administered 2023-11-03: 30 mg via INTRAVENOUS
  Administered 2023-11-03: 100 mg via INTRAVENOUS
  Administered 2023-11-03: 50 mg via INTRAVENOUS

## 2023-11-03 MED ORDER — PHENYLEPHRINE 80 MCG/ML (10ML) SYRINGE FOR IV PUSH (FOR BLOOD PRESSURE SUPPORT)
PREFILLED_SYRINGE | INTRAVENOUS | Status: DC | PRN
Start: 2023-11-03 — End: 2023-11-03
  Administered 2023-11-03: 80 ug via INTRAVENOUS

## 2023-11-03 MED ORDER — LACTATED RINGERS IV SOLN: INTRAVENOUS | Status: DC

## 2023-11-03 MED ORDER — PROPOFOL 500 MG/50ML IV EMUL
INTRAVENOUS | Status: DC | PRN
Start: 2023-11-03 — End: 2023-11-03
  Administered 2023-11-03: 150 ug/kg/min via INTRAVENOUS

## 2023-11-03 MED ORDER — LIDOCAINE 2% (20 MG/ML) 5 ML SYRINGE
INTRAMUSCULAR | Status: DC | PRN
Start: 2023-11-03 — End: 2023-11-03
  Administered 2023-11-03: 100 mg via INTRAVENOUS

## 2023-11-03 NOTE — Discharge Instructions (Signed)
 You are being discharged to home.  Resume your previous diet.  We are waiting for your pathology results.  Your physician has recommended a repeat colonoscopy in three years for surveillance.  Genetic counseling referral.

## 2023-11-03 NOTE — Anesthesia Procedure Notes (Signed)
 Date/Time: 11/03/2023 10:31 AM  Performed by: Sherwin Donate, CRNAPre-anesthesia Checklist: Patient identified, Emergency Drugs available, Suction available and Patient being monitored Patient Re-evaluated:Patient Re-evaluated prior to induction Oxygen Delivery Method: Nasal cannula Induction Type: IV induction Placement Confirmation: positive ETCO2

## 2023-11-03 NOTE — Interval H&P Note (Signed)
 History and Physical Interval Note:  11/03/2023 10:00 AM  [name]  has presented today for surgery, with the diagnosis of CONSTIPATION.  The various methods of treatment have been discussed with the patient and family. After consideration of risks, benefits and other options for treatment, the patient has consented to  Procedure(s) with comments: COLONOSCOPY (N/A) - 11:45AM;ASA 1 as a surgical intervention.  The patient's history has been reviewed, patient examined, no change in status, stable for surgery.  I have reviewed the patient's chart and labs.  Questions were answered to the patient's satisfaction.     [name]

## 2023-11-03 NOTE — Anesthesia Preprocedure Evaluation (Signed)
 Anesthesia Evaluation  Patient identified by MRN, date of birth, ID band Patient awake    Reviewed: Allergy  & Precautions, H&P , NPO status , Patient's Chart, lab work & pertinent test results, reviewed documented beta blocker date and time   History of Anesthesia Complications (+) Family history of anesthesia reaction  Airway Mallampati: II  TM Distance: >3 FB Neck ROM: full    Dental no notable dental hx.    Pulmonary neg pulmonary ROS   Pulmonary exam normal breath sounds clear to auscultation       Cardiovascular Exercise Tolerance: Good hypertension, negative cardio ROS  Rhythm:regular Rate:Normal     Neuro/Psych   Anxiety     negative neurological ROS  negative psych ROS   GI/Hepatic negative GI ROS, Neg liver ROS,GERD  ,,  Endo/Other  negative endocrine ROS    Renal/GU negative Renal ROS  negative genitourinary   Musculoskeletal   Abdominal   Peds  Hematology negative hematology ROS (+)   Anesthesia Other Findings   Reproductive/Obstetrics negative OB ROS                             Anesthesia Physical Anesthesia Plan  ASA: 2  Anesthesia Plan: General   Post-op Pain Management:    Induction:   PONV Risk Score and Plan: Propofol  infusion  Airway Management Planned:   Additional Equipment:   Intra-op Plan:   Post-operative Plan:   Informed Consent: I have reviewed the patients History and Physical, chart, labs and discussed the procedure including the risks, benefits and alternatives for the proposed anesthesia with the patient or authorized representative who has indicated his/her understanding and acceptance.     Dental Advisory Given  Plan Discussed with: CRNA  Anesthesia Plan Comments:        Anesthesia Quick Evaluation

## 2023-11-03 NOTE — Transfer of Care (Signed)
 Immediate Anesthesia Transfer of Care Note  Patient: [name]  Procedure(s) Performed: COLONOSCOPY  Patient Location: Endoscopy Unit  Anesthesia Type:General  Level of Consciousness: drowsy  Airway & Oxygen Therapy: Patient Spontanous Breathing  Post-op Assessment: Report given to RN and Post -op Vital signs reviewed and stable  Post vital signs: Reviewed and stable  Last Vitals:  Vitals Value Taken Time  BP    Temp    Pulse    Resp    SpO2      Last Pain:  Vitals:   11/03/23 1013  TempSrc: Oral         Complications: No notable events documented.

## 2023-11-03 NOTE — Op Note (Addendum)
 [facility] Patient Name: [name] Procedure Date: 11/03/2023 10:04 AM MRN: [id number] Date of Birth: 1945-10-25 Attending MD: Samantha Cress , , [id number] CSN: [id number] Age: 78 Admit Type: Outpatient Procedure:                Colonoscopy Indications:              Constipation Providers:                Samantha Cress, Crystal Page, Sharlette Dayhoff                            Technician, Technician Referring MD:              Medicines:                Monitored Anesthesia Care Complications:            No immediate complications. Estimated Blood Loss:     Estimated blood loss: none. Procedure:                Pre-Anesthesia Assessment:                           - Prior to the procedure, a History and Physical                            was performed, and patient medications, allergies                            and sensitivities were reviewed. The patient's                            tolerance of previous anesthesia was reviewed.                           - The risks and benefits of the procedure and the                            sedation options and risks were discussed with the                            patient. All questions were answered and informed                            consent was obtained.                           - ASA Grade Assessment: II - A patient with mild                            systemic disease.                           After obtaining informed consent, the colonoscope                            was passed under direct vision. Throughout the  procedure, the patient's blood pressure, pulse, and                            oxygen saturations were monitored continuously. The                            [id number] ([id number]) was introduced through the                            anus and advanced to the the cecum, identified by                            appendiceal orifice and ileocecal valve. The                            patient  tolerated the procedure well. The quality                            of the bowel preparation was good. The colonoscopy                            was somewhat difficult due to a tortuous colon.                            Successful completion of the procedure was aided by                            using water  immersion. Scope In: 10:34:40 AM Scope Out: 10:59:21 AM Scope Withdrawal Time: 0 hours 16 minutes 9 seconds  Total Procedure Duration: 0 hours 24 minutes 41 seconds  Findings:      The perianal and digital rectal examinations were normal.      Six sessile polyps were found in the transverse colon, ascending colon       and cecum. The polyps were 1 to 5 mm in size. These polyps were removed       with a cold snare. Resection and retrieval were complete.      A 4 mm polyp was found in the descending colon. The polyp was sessile.       The polyp was removed with a cold snare. Resection and retrieval were       complete.      Scattered small-mouthed diverticula were found in the sigmoid colon and       descending colon.      The sigmoid colon was moderately tortuous. Copious water  irrigation and       scope maneurvering allowed effective scope advancement.      Non-bleeding internal hemorrhoids were found during retroflexion. The       hemorrhoids were small. Impression:               - Six 1 to 5 mm polyps in the transverse colon, in                            the ascending colon and in the cecum, removed with  a cold snare. Resected and retrieved.                           - One 4 mm polyp in the descending colon, removed                            with a cold snare. Resected and retrieved.                           - Diverticulosis in the sigmoid colon and in the                            descending colon.                           - Tortuous colon.                           - Non-bleeding internal hemorrhoids. Moderate Sedation:      Per Anesthesia  Care Recommendation:           - Discharge patient to home (ambulatory).                           - Resume previous diet.                           - Await pathology results.                           - Repeat colonoscopy in 3 years for surveillance.                           - Genetic counseling referral discussed with                            patient - she will think about this and will let us                             know if she wants to have the referral Procedure Code(s):        --- Professional ---                           [phone], Colonoscopy, flexible; with removal of                            tumor(s), polyp(s), or other lesion(s) by snare                            technique Diagnosis Code(s):        --- Professional ---                           D12.3, Benign neoplasm of transverse colon (hepatic  flexure or splenic flexure)                           D12.2, Benign neoplasm of ascending colon                           D12.0, Benign neoplasm of cecum                           D12.4, Benign neoplasm of descending colon                           K64.8, Other hemorrhoids                           K59.00, Constipation, unspecified                           K57.30, Diverticulosis of large intestine without                            perforation or abscess without bleeding                           Q43.8, Other specified congenital malformations of                            intestine CPT copyright 2022 [employer]. All rights reserved. The codes documented in this report are preliminary and upon coder review may  be revised to meet current compliance requirements. Samantha Cress, MD Samantha Cress,  11/03/2023 11:08:46 AM This report has been signed electronically. Number of Addenda: 0

## 2023-11-04 ENCOUNTER — Encounter (HOSPITAL_COMMUNITY): Payer: Self-pay | Admitting: Gastroenterology

## 2023-11-04 LAB — SURGICAL PATHOLOGY

## 2023-11-05 NOTE — Anesthesia Postprocedure Evaluation (Signed)
 Anesthesia Post Note  Patient: [name]  Procedure(s) Performed: COLONOSCOPY  Patient location during evaluation: Phase II Anesthesia Type: General Level of consciousness: awake Pain management: pain level controlled Vital Signs Assessment: post-procedure vital signs reviewed and stable Respiratory status: spontaneous breathing and respiratory function stable Cardiovascular status: blood pressure returned to baseline and stable Postop Assessment: no headache and no apparent nausea or vomiting Anesthetic complications: no Comments: Late entry   No notable events documented.   Last Vitals:  Vitals:   11/03/23 1013 11/03/23 1102  BP: (!) 140/62 (!) 107/45  Pulse: 80 72  Resp: 15 11  Temp: 37.1 C 36.4 C  SpO2: 95% 95%    Last Pain:  Vitals:   11/03/23 1102  TempSrc: Axillary  PainSc: 0-No pain                 Coretha Dew

## 2023-11-06 ENCOUNTER — Ambulatory Visit (INDEPENDENT_AMBULATORY_CARE_PROVIDER_SITE_OTHER): Payer: Self-pay | Admitting: Gastroenterology

## 2023-11-18 ENCOUNTER — Ambulatory Visit: Admitting: Family Medicine

## 2023-12-22 ENCOUNTER — Other Ambulatory Visit: Payer: Self-pay | Admitting: Family Medicine

## 2023-12-28 ENCOUNTER — Other Ambulatory Visit: Payer: Self-pay | Admitting: Family Medicine

## 2024-01-21 DIAGNOSIS — H524 Presbyopia: Secondary | ICD-10-CM | POA: Diagnosis not present

## 2024-01-24 NOTE — Progress Notes (Signed)
 [facility] - Clinic Note  02/07/2024   CHIEF COMPLAINT Patient presents for Retina Follow Up  HISTORY OF PRESENT ILLNESS: [name] is a 78 y.o. female who presents to the clinic today for:  HPI     Retina Follow Up   Patient presents with  Dry AMD.  In both eyes.  This started 6 months ago.  Duration of 6 months.  Since onset it is stable.  I, the attending physician,  performed the HPI with the patient and updated documentation appropriately.        Comments   Patient feels the vision is the same since her last visit. She is using Systane PRN.      Last edited by Valdemar Rogue, MD on 02/13/2024 12:05 AM.    Pt states her vision is doing well. She got new glasses. She is using Systane as needed.   Referring physician: Darroll Anes, DO [address] [place],  [place] [address]  HISTORICAL INFORMATION:  Selected notes from the MEDICAL RECORD NUMBER Referred by Dr. Darroll LEE:  Ocular Hx- PMH-   CURRENT MEDICATIONS: No current outpatient medications on file. (Ophthalmic Drugs)   No current facility-administered medications for this visit. (Ophthalmic Drugs)   Current Outpatient Medications (Other)  Medication Sig   Cholecalciferol (VITAMIN D-3) 5000 units TABS Take 5,000 Units by mouth in the morning.   Dexlansoprazole  30 MG capsule DR Take 30 mg by mouth daily.   fluticasone  (FLONASE ) 50 MCG/ACT nasal spray Place 2 sprays into both nostrils daily.   gabapentin (NEURONTIN) 300 MG capsule Take 300 mg by mouth at bedtime.   hydrochlorothiazide  (HYDRODIURIL ) 25 MG tablet TAKE 1 TABLET (25 MG TOTAL) BY MOUTH DAILY   lansoprazole (PREVACID) 30 MG capsule TAKE ONE (1) TABLET BY MOUTH EVERY DAY   losartan  (COZAAR ) 50 MG tablet TAKE ONE TABLET (50MG  TOTAL) BY MOUTH TWO TIMES DAILY   Multiple Vitamins-Minerals (PRESERVISION AREDS 2) CAPS Take 1 capsule by mouth 2 (two) times daily.   rosuvastatin  (CRESTOR ) 5 MG tablet Take 1 tablet (5 mg total) by mouth  every evening.   sucralfate  (CARAFATE ) 1 g tablet Take 1 tablet (1 g total) by mouth 2 (two) times daily.   traZODone  (DESYREL ) 50 MG tablet Take 1 tablet (50 mg total) by mouth at bedtime.   No current facility-administered medications for this visit. (Other)   REVIEW OF SYSTEMS: ROS   Positive for: Musculoskeletal, Cardiovascular, Eyes Last edited by Myra Wanda SAILOR, COT on 02/07/2024  7:32 AM.     ALLERGIES Allergies  Allergen Reactions   Cefzil [Cefprozil] Rash   PAST MEDICAL HISTORY Past Medical History:  Diagnosis Date   Anxiety    Arthritis    all over; mostly knees and shoulders  (06/04/2015)    Cataract    Family history of adverse reaction to anesthesia    Patients mother had bad N/V after anesthesia   GERD (gastroesophageal reflux disease)    High cholesterol    Hypertension    Primary localized osteoarthritis of left knee    Past Surgical History:  Procedure Laterality Date   APPENDECTOMY     CHOLECYSTECTOMY OPEN     pain   COLONOSCOPY N/A 04/22/2016   Procedure: COLONOSCOPY;  Surgeon: Claudis RAYMOND Rivet, MD;  [facility];  Service: Endoscopy;  Laterality: N/A;  1200   COLONOSCOPY N/A 11/03/2023   Procedure: COLONOSCOPY;  Surgeon: Eartha Angelia Sieving, MD;  [facility]: [facility];  Service: Gastroenterology;  Laterality: N/A;  11:45AM;ASA 1   COLONOSCOPY W/ POLYPECTOMY     COLONOSCOPY WITH PROPOFOL  N/A 10/31/2021   Procedure: COLONOSCOPY WITH PROPOFOL ;  Surgeon: Eartha Angelia Sieving, MD;  [facility]: [facility];  Service: Gastroenterology;  Laterality: N/A;  935   ESOPHAGOGASTRODUODENOSCOPY N/A 09/08/2017   Procedure: ESOPHAGOGASTRODUODENOSCOPY (EGD) WITH ESOPHAGEAL DILATION;  Surgeon: Golda Claudis PENNER, MD;  [facility]: [facility];  Service: Endoscopy;  Laterality: N/A;  3:00   JOINT REPLACEMENT     KNEE ARTHROSCOPY W/ MENISCECTOMY Left    KNEE SURGERY Left 1990s   MASS EXCISION N/A 07/27/2016   Procedure: EXCISION OF SEBACEOUS CYST ON  NECK;  Surgeon: Daniel Moccasin, MD;  Location:  SURGERY CENTER;  Service: ENT;  Laterality: N/A;   OOPHORECTOMY     for a cyst ? 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   Procedure: TOTAL LEFT KNEE ARTHROPLASTY;  Surgeon: Lamar Millman, MD;  Location: [facility];  Service: Orthopedics;  Laterality: Left;   TUBAL LIGATION     FAMILY HISTORY Family History  Problem Relation Age of Onset   Heart disease Mother    Diabetes Mellitus II Mother    Kidney disease Mother    Cancer Mother    Hypertension Father    Cancer Father    Hypertension Sister    Hypertension Brother    Colon cancer Neg Hx    SOCIAL HISTORY Social History   Tobacco Use   Smoking status: Never   Smokeless tobacco: Never  Vaping Use   Vaping status: Never Used  Substance Use Topics   Alcohol use: No    Alcohol/week: 0.0 standard drinks of alcohol   Drug use: No       OPHTHALMIC EXAM:  Base Eye Exam     Visual Acuity (Snellen - Linear)       Right Left   Dist cc 20/25 +1 20/20 +2   Dist ph cc NI     Correction: Glasses         Tonometry (Tonopen, 7:34 AM)       Right Left   Pressure 16 14         Pupils       Dark Light Shape React APD   Right 3 2 Round Brisk None   Left 3 2 Round Brisk None         Visual Fields       Left Right    Full Full         Extraocular Movement       Right Left    Full, Ortho Full, Ortho         Neuro/Psych     Oriented x3: Yes   Mood/Affect: Normal         Dilation     Both eyes: 1.0% Mydriacyl, 2.5% Phenylephrine  @ 7:32 AM           Slit Lamp and Fundus Exam     Slit Lamp Exam       Right Left   Lids/Lashes Dermatochalasis - upper lid Dermatochalasis - upper lid   Conjunctiva/Sclera White and quiet White and quiet   Cornea well  healed cataract wound, 1+ Punctate epithelial erosions, trace tear film debris trace tear film debris, well healed cataract wound, 1+ PEE   Anterior  Chamber deep and clear deep and clear   Iris Round and dilated Round and dilated   Lens PC IOL in good position PC IOL in good position   Anterior Vitreous mild syneresis, Posterior vitreous detachment Vitreous syneresis         Fundus Exam       Right Left   Disc Pink and Sharp Pink and Sharp   C/D Ratio 0.3 0.1   Macula Flat, Blunted foveal reflex, central PEDs, drusen, RPE mottling and clumping, no heme Flat, Blunted foveal reflex, prominent central PEDs, drusen, RPE mottling and clumping, no heme or edema   Vessels attenuated, mild tortuosity attenuated, mild tortuosity, mild copper wiring   Periphery Attached, mild reticular degeneration, mild peripheral drusen, No heme Attached, mild reticular degeneration, mild peripheral drusen, No heme           Refraction     Wearing Rx       Sphere Cylinder Axis Add   Right -1.00 +1.50 015 +2.50   Left -1.50 +1.50 164 +2.50           IMAGING AND PROCEDURES  Imaging and Procedures for 02/07/2024  OCT, Retina - OU - Both Eyes       Right Eye Quality was good. Central Foveal Thickness: 308. Progression has been stable. Findings include normal foveal contour, no IRF, no SRF, retinal drusen , pigment epithelial detachment.   Left Eye Quality was good. Central Foveal Thickness: 273. Progression has been stable. Findings include no IRF, no SRF, abnormal foveal contour, retinal drusen , pigment epithelial detachment, outer retinal atrophy (Prominent central PEDs with ORA).   Notes *Images captured and stored on drive  Diagnosis / Impression:  Non-exu ARMD OU  Clinical management:  See below  Abbreviations: NFP - Normal foveal profile. CME - cystoid macular edema. PED - pigment epithelial detachment. IRF - intraretinal fluid. SRF - subretinal fluid. EZ - ellipsoid zone. ERM -  epiretinal membrane. ORA - outer retinal atrophy. ORT - outer retinal tubulation. SRHM - subretinal hyper-reflective material. IRHM - intraretinal hyper-reflective material           ASSESSMENT/PLAN:   ICD-10-CM   1. Intermediate stage nonexudative age-related macular degeneration of both eyes  H35.3132 OCT, Retina - OU - Both Eyes    2. Essential hypertension  I10     3. Hypertensive retinopathy of both eyes  H35.033     4. Pseudophakia, both eyes  Z96.1      Age related macular degeneration, non-exudative, both eyes - intermediate stage with +PED centrally -- no heme or exudative disease - The incidence, anatomy, and pathology of dry AMD, risk of progression, and the AREDS and AREDS 2 study including smoking risks discussed with patient.  - BCVA OD 20/25, OS 20/20 - improved from 20/30 OU  - Cont amsler grid monitoring  - f/u 6-9 months, DFE, OCT  2,3. Hypertensive retinopathy OU - discussed importance of tight BP control - monitor  4. Pseudophakia OU  - s/p CE/IOL  - IOL in good position, doing well  - monitor   Ophthalmic Meds Ordered this visit:  No orders of the defined types were placed in this encounter.    Return in about 9 months (around 11/06/2024) for f/u, Non Ex. AMD, OU, DFE, OCT.  There are no Patient Instructions on file for this visit.  Explained the diagnoses, plan, and follow up with the patient and they expressed understanding.  Patient expressed understanding of the importance of proper follow  up care.   This document serves as a record of services personally performed by Redell JUDITHANN Hans, MD, PhD. It was created on their behalf by Avelina Pereyra, COA an ophthalmic technician. The creation of this record is the provider's dictation and/or activities during the visit.   Electronically signed by: Avelina GORMAN Pereyra, COT  02/13/24  12:18 AM   This document serves as a record of services personally performed by Redell JUDITHANN Hans, MD, PhD. It was created on  their behalf by Wanda GEANNIE Keens, COT an ophthalmic technician. The creation of this record is the provider's dictation and/or activities during the visit.    Electronically signed by:  Wanda GEANNIE Keens, COT  02/13/24 12:18 AM  Redell JUDITHANN Hans, M.D., Ph.D. Diseases & Surgery of the Retina and Vitreous [facility]  I have reviewed the above documentation for accuracy and completeness, and I agree with the above. Redell JUDITHANN Hans, M.D., Ph.D. 02/13/24 12:20 AM   Abbreviations: M myopia (nearsighted); A astigmatism; H hyperopia (farsighted); P presbyopia; Mrx spectacle prescription;  CTL contact lenses; OD right eye; OS left eye; OU both eyes  XT exotropia; ET esotropia; PEK punctate epithelial keratitis; PEE punctate epithelial erosions; DES dry eye syndrome; MGD meibomian gland dysfunction; ATs artificial tears; PFAT's preservative free artificial tears; NSC nuclear sclerotic cataract; PSC posterior subcapsular cataract; ERM epi-retinal membrane; PVD posterior vitreous detachment; RD retinal detachment; DM diabetes mellitus; DR diabetic retinopathy; NPDR non-proliferative diabetic retinopathy; PDR proliferative diabetic retinopathy; CSME clinically significant macular edema; DME diabetic macular edema; dbh dot blot hemorrhages; CWS cotton wool spot; POAG primary open angle glaucoma; C/D cup-to-disc ratio; HVF humphrey visual field; GVF goldmann visual field; OCT optical coherence tomography; IOP intraocular pressure; BRVO Branch retinal vein occlusion; CRVO central retinal vein occlusion; CRAO central retinal artery occlusion; BRAO branch retinal artery occlusion; RT retinal tear; SB scleral buckle; PPV pars plana vitrectomy; VH Vitreous hemorrhage; PRP panretinal laser photocoagulation; IVK intravitreal kenalog ; VMT vitreomacular traction; MH Macular hole;  NVD neovascularization of the disc; NVE neovascularization elsewhere; AREDS age related eye disease study; ARMD age  related macular degeneration; POAG primary open angle glaucoma; EBMD epithelial/anterior basement membrane dystrophy; ACIOL anterior chamber intraocular lens; IOL intraocular lens; PCIOL posterior chamber intraocular lens; Phaco/IOL phacoemulsification with intraocular lens placement; PRK photorefractive keratectomy; LASIK laser assisted in situ keratomileusis; HTN hypertension; DM diabetes mellitus; COPD chronic obstructive pulmonary disease

## 2024-01-25 ENCOUNTER — Other Ambulatory Visit: Payer: Self-pay | Admitting: Family Medicine

## 2024-02-07 ENCOUNTER — Ambulatory Visit (INDEPENDENT_AMBULATORY_CARE_PROVIDER_SITE_OTHER): Admitting: Ophthalmology

## 2024-02-07 DIAGNOSIS — H35033 Hypertensive retinopathy, bilateral: Secondary | ICD-10-CM

## 2024-02-07 DIAGNOSIS — H353132 Nonexudative age-related macular degeneration, bilateral, intermediate dry stage: Secondary | ICD-10-CM

## 2024-02-07 DIAGNOSIS — Z961 Presence of intraocular lens: Secondary | ICD-10-CM | POA: Diagnosis not present

## 2024-02-07 DIAGNOSIS — I10 Essential (primary) hypertension: Secondary | ICD-10-CM | POA: Diagnosis not present

## 2024-02-13 ENCOUNTER — Encounter (INDEPENDENT_AMBULATORY_CARE_PROVIDER_SITE_OTHER): Payer: Self-pay | Admitting: Ophthalmology

## 2024-02-16 ENCOUNTER — Ambulatory Visit (INDEPENDENT_AMBULATORY_CARE_PROVIDER_SITE_OTHER)

## 2024-02-16 VITALS — Ht 65.0 in | Wt 193.0 lb

## 2024-02-16 DIAGNOSIS — Z0000 Encounter for general adult medical examination without abnormal findings: Secondary | ICD-10-CM | POA: Diagnosis not present

## 2024-02-16 NOTE — Patient Instructions (Signed)
 [name],  Thank you for taking the time for your Medicare Wellness Visit. I appreciate your continued commitment to your health goals. Please review the care plan we discussed, and feel free to reach out if I can assist you further.  Medicare recommends these wellness visits once per year to help you and your care team stay ahead of potential health issues. These visits are designed to focus on prevention, allowing your provider to concentrate on managing your acute and chronic conditions during your regular appointments.  Please note that Annual Wellness Visits do not include a physical exam. Some assessments may be limited, especially if the visit was conducted virtually. If needed, we may recommend a separate in-person follow-up with your provider.  Ongoing Care Seeing your primary care provider every 3 to 6 months helps us  monitor your health and provide consistent, personalized care.   Referrals If a referral was made during today's visit and you haven't received any updates within two weeks, please contact the referred provider directly to check on the status.  Recommended Screenings:  Health Maintenance  Topic Date Due   Flu Shot  12/31/2023   COVID-19 Vaccine (5 - 2025-26 season) 01/31/2024   Hepatitis C Screening  06/28/2024*   Medicare Annual Wellness Visit  02/15/2025   Colon Cancer Screening  11/03/2026   DTaP/Tdap/Td vaccine (2 - Tdap) 07/01/2028   Pneumococcal Vaccine for age over 48  Completed   DEXA scan (bone density measurement)  Completed   Zoster (Shingles) Vaccine  Completed   HPV Vaccine  Aged Out   Meningitis B Vaccine  Aged Out   Breast Cancer Screening  Discontinued  *Topic was postponed. The date shown is not the original due date.       02/16/2024    2:32 PM  Advanced Directives  Does Patient Have a Medical Advance Directive? No  Would patient like information on creating a medical advance directive? Yes (MAU/Ambulatory/Procedural Areas - Information  given)   Advance Care Planning is important because it: Ensures you receive medical care that aligns with your values, goals, and preferences. Provides guidance to your family and loved ones, reducing the emotional burden of decision-making during critical moments.  Information on Advanced Care Planning can be found at [place]  Secretary of [facility] Advance Health Care Directives Advance Health Care Directives ([email or web address]   Vision: Annual vision screenings are recommended for early detection of glaucoma, cataracts, and diabetic retinopathy. These exams can also reveal signs of chronic conditions such as diabetes and high blood pressure.  Dental: Annual dental screenings help detect early signs of oral cancer, gum disease, and other conditions linked to overall health, including heart disease and diabetes.  Please see the attached documents for additional preventive care recommendations.

## 2024-02-16 NOTE — Progress Notes (Signed)
 Subjective:   [name] is a 78 y.o. who presents for a Medicare Wellness preventive visit.  As a reminder, Annual Wellness Visits don't include a physical exam, and some assessments may be limited, especially if this visit is performed virtually. We may recommend an in-person follow-up visit with your provider if needed.  Visit Complete: Virtual I connected with  Nichole Hurst on 02/16/24 by a audio enabled telemedicine application and verified that I am speaking with the correct person using two identifiers.  Patient Location: Home  Provider Location: Home Office  I discussed the limitations of evaluation and management by telemedicine. The patient expressed understanding and agreed to proceed.  Vital Signs: Because this visit was a virtual/telehealth visit, some criteria may be missing or patient reported. Any vitals not documented were not able to be obtained and vitals that have been documented are patient reported.  VideoDeclined- This patient declined Librarian, academic. Therefore the visit was completed with audio only.  Persons Participating in Visit: Patient.  AWV Questionnaire: Yes: Patient Medicare AWV questionnaire was completed by the patient on 02/12/24; I have confirmed that all information answered by patient is correct and no changes since this date.  Cardiac Risk Factors include: advanced age (>73men, >37 women);hypertension;dyslipidemia     Objective:    Today's Vitals   02/16/24 1426  Weight: 193 lb (87.5 kg)  Height: 5' 5 (1.651 m)   Body mass index is 32.12 kg/m.     02/16/2024    2:32 PM 11/03/2023   10:03 AM 02/27/2022   12:48 PM 10/31/2021    8:01 AM 09/08/2017    1:56 PM 07/27/2017   11:48 PM 07/27/2016    8:05 AM  Advanced Directives  Does Patient Have a Medical Advance Directive? No Yes Yes No Yes  No  No   Type of Advance Directive  Living will Healthcare Power of [place];Living will      Copy of Healthcare Power of  Attorney in Chart?   No - copy requested      Would patient like information on creating a medical advance directive? Yes (MAU/Ambulatory/Procedural Areas - Information given)   No - Patient declined  No - Patient declined  No - Patient declined      Data saved with a previous flowsheet row definition    Current Medications (verified) Outpatient Encounter Medications as of 02/16/2024  Medication Sig   Cholecalciferol (VITAMIN D-3) 5000 units TABS Take 5,000 Units by mouth in the morning.   Dexlansoprazole  30 MG capsule DR Take 30 mg by mouth daily.   gabapentin (NEURONTIN) 300 MG capsule Take 300 mg by mouth at bedtime.   hydrochlorothiazide  (HYDRODIURIL ) 25 MG tablet TAKE 1 TABLET (25 MG TOTAL) BY MOUTH DAILY   lansoprazole (PREVACID) 30 MG capsule TAKE ONE (1) TABLET BY MOUTH EVERY DAY   losartan  (COZAAR ) 50 MG tablet TAKE ONE TABLET (50MG  TOTAL) BY MOUTH TWO TIMES DAILY   Multiple Vitamins-Minerals (PRESERVISION AREDS 2) CAPS Take 1 capsule by mouth 2 (two) times daily.   rosuvastatin  (CRESTOR ) 5 MG tablet Take 1 tablet (5 mg total) by mouth every evening.   sucralfate  (CARAFATE ) 1 g tablet Take 1 tablet (1 g total) by mouth 2 (two) times daily.   traZODone  (DESYREL ) 50 MG tablet Take 1 tablet (50 mg total) by mouth at bedtime.   fluticasone  (FLONASE ) 50 MCG/ACT nasal spray Place 2 sprays into both nostrils daily.   No facility-administered encounter medications on file as of 02/16/2024.  Allergies (verified) Cefzil [cefprozil]   History: Past Medical History:  Diagnosis Date   Anxiety    Arthritis    all over; mostly knees and shoulders  (06/04/2015)    Cataract    Family history of adverse reaction to anesthesia    Patients mother had bad N/V after anesthesia   GERD (gastroesophageal reflux disease)    Glaucoma 2024   Heart murmur 2022   High cholesterol    Hypertension    Primary localized osteoarthritis of left knee    Past Surgical History:  Procedure Laterality  Date   APPENDECTOMY     CHOLECYSTECTOMY OPEN     pain   COLONOSCOPY N/A 04/22/2016   Procedure: COLONOSCOPY;  Surgeon: Claudis RAYMOND Rivet, MD;  [facility];  Service: Endoscopy;  Laterality: N/A;  1200   COLONOSCOPY N/A 11/03/2023   Procedure: COLONOSCOPY;  Surgeon: Eartha Angelia Sieving, MD;  [facility];  Service: Gastroenterology;  Laterality: N/A;  11:45AM;ASA 1   COLONOSCOPY W/ POLYPECTOMY     COLONOSCOPY WITH PROPOFOL  N/A 10/31/2021   Procedure: COLONOSCOPY WITH PROPOFOL ;  Surgeon: Eartha Angelia Sieving, MD;  [facility];  Service: Gastroenterology;  Laterality: N/A;  935   ESOPHAGOGASTRODUODENOSCOPY N/A 09/08/2017   Procedure: ESOPHAGOGASTRODUODENOSCOPY (EGD) WITH ESOPHAGEAL DILATION;  Surgeon: Rivet Claudis RAYMOND, MD;  [facility];  Service: Endoscopy;  Laterality: N/A;  3:00   EYE SURGERY  2022   JOINT REPLACEMENT     KNEE ARTHROSCOPY W/ MENISCECTOMY Left    KNEE SURGERY Left 1990s   MASS EXCISION N/A 07/27/2016   Procedure: EXCISION OF SEBACEOUS CYST ON NECK;  Surgeon: Daniel Moccasin, MD;  Location: [facility];  Service: ENT;  Laterality: N/A;   OOPHORECTOMY     for a cyst ? 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   Procedure: TOTAL LEFT KNEE ARTHROPLASTY;  Surgeon: Lamar Millman, MD;  Location[facility];  Service: Orthopedics;  Laterality: Left;   TUBAL LIGATION     Family History  Problem Relation Age of Onset   Heart disease Mother    Diabetes Mellitus II Mother    Kidney disease Mother    Cancer Mother    Arthritis Mother    Hypertension Father    Cancer Father    Hypertension Sister    Hypertension Brother    Colon cancer Neg Hx    Social History   Socioeconomic History   Marital status: Married    Spouse  name: Hydrologist   Number of children: 6   Years of education: Not on file   Highest education level: 10th grade  Occupational History   Not on file  Tobacco Use   Smoking status: Never   Smokeless tobacco: Never   Tobacco comments:    Don't smoke or use tobacco  Vaping Use   Vaping status: Never Used  Substance and Sexual Activity   Alcohol use: No    Alcohol/week: 0.0 standard drinks of alcohol   Drug use: No   Sexual activity: Not Currently  Other Topics Concern   Not on file  Social History Narrative   Lives with husband, Akiko Schexnider.  [phone] cell   Social Drivers of Health   Financial Resource Strain: Low Risk  (02/12/2024)   Overall Financial Resource Strain (CARDIA)  Difficulty of Paying Living Expenses: Not hard at all  Food Insecurity: No Food Insecurity (02/12/2024)   Hunger Vital Sign    Worried About Running Out of Food in the Last Year: Never true    Ran Out of Food in the Last Year: Never true  Transportation Needs: No Transportation Needs (02/12/2024)   PRAPARE - Administrator, Civil Service (Medical): No    Lack of Transportation (Non-Medical): No  Physical Activity: Insufficiently Active (02/12/2024)   Exercise Vital Sign    Days of Exercise per Week: 2 days    Minutes of Exercise per Session: 20 min  Stress: No Stress Concern Present (02/12/2024)   [employer] of Occupational Health - Occupational Stress Questionnaire    Feeling of Stress: Not at all  Social Connections: Socially Isolated (02/12/2024)   Social Connection and Isolation Panel    Frequency of Communication with Friends and Family: Once a week    Frequency of Social Gatherings with Friends and Family: Once a week    Attends Religious Services: Patient declined    Database administrator or Organizations: No    Attends Engineer, structural: Never    Marital Status: Married    Tobacco Counseling Counseling given: Not Answered Tobacco comments: Don't smoke or  use tobacco    Clinical Intake:  Pre-visit preparation completed: Yes  Pain : No/denies pain  Diabetes: No   How often do you need to have someone help you when you read instructions, pamphlets, or other written materials from your doctor or pharmacy?: 1 - Never  Interpreter Needed?: No  Information entered by :: Charmaine Bloodgood LPN   Activities of Daily Living     02/16/2024    2:32 PM  In your present state of health, do you have any difficulty performing the following activities:  Hearing? 0  Vision? 0  Difficulty concentrating or making decisions? 0  Walking or climbing stairs? 0  Dressing or bathing? 0  Doing errands, shopping? 0  Preparing Food and eating ? N  Using the Toilet? N  In the past six months, have you accidently leaked urine? N  Do you have problems with loss of bowel control? N  Managing your Medications? N  Managing your Finances? N  Housekeeping or managing your Housekeeping? N    Patient Care Team: Kayla Jeoffrey RAMAN, FNP as PCP - General (Family Medicine) Alvan Dorn FALCON, MD as PCP - Cardiology (Cardiology) Valdemar Rogue, MD as Consulting Physician (Ophthalmology) Dr Willma [facility], OD  I have updated your Care Teams any recent Medical Services you may have received from other providers in the past year.     Assessment:   This is a routine wellness examination for [name].  Hearing/Vision screen Hearing Screening - Comments:: Denies hearing difficulties   Vision Screening - Comments:: Wears rx glasses - up to date with routine eye exams with Dr. Willma Moats    Goals Addressed             This Visit's Progress    Maintain health and independence   On track      Depression Screen     02/16/2024    2:31 PM 10/26/2023    8:02 AM 06/29/2023   10:38 AM 06/01/2023    9:28 AM  PHQ 2/9 Scores  PHQ - 2 Score 0 0 0 0  PHQ- 9 Score   0     Fall Risk     02/16/2024  2:32 PM 10/26/2023    8:01 AM 06/01/2023     9:28 AM  Fall Risk   Falls in the past year? 0 0 0  Number falls in past yr: 0 0 0  Injury with Fall? 0 0 0  Risk for fall due to : No Fall Risks No Fall Risks   Follow up Falls prevention discussed;Education provided;Falls evaluation completed Falls evaluation completed     MEDICARE RISK AT HOME:  Medicare Risk at Home Any stairs in or around the home?: No If so, are there any without handrails?: No Home free of loose throw rugs in walkways, pet beds, electrical cords, etc?: Yes Adequate lighting in your home to reduce risk of falls?: Yes Life alert?: No Use of a cane, walker or w/c?: No Grab bars in the bathroom?: Yes Shower chair or bench in shower?: No Elevated toilet seat or a handicapped toilet?: Yes  TIMED UP AND GO:  Was the test performed?  No  Cognitive Function: 6CIT completed        02/16/2024    2:32 PM  6CIT Screen  What Year? 0 points  What month? 0 points  What time? 0 points  Count back from 20 0 points  Months in reverse 0 points  Repeat phrase 0 points  Total Score 0 points    Immunizations Immunization History  Administered Date(s) Administered   Fluad Quad(high Dose 65+) 03/06/2019, 03/03/2021   Influenza-Unspecified 03/02/2015, 04/25/2020   Moderna Sars-Covid-2 Vaccination 06/13/2019, 07/24/2019, 03/29/2020, 11/08/2020   PNEUMOCOCCAL CONJUGATE-20 07/27/2023   Td 07/01/2018   Zoster Recombinant(Shingrix) 01/20/2018, 04/05/2018    Screening Tests Health Maintenance  Topic Date Due   Influenza Vaccine  12/31/2023   COVID-19 Vaccine (5 - 2025-26 season) 01/31/2024   Hepatitis C Screening  06/28/2024 (Originally 05/08/1964)   Medicare Annual Wellness (AWV)  02/15/2025   Colonoscopy  11/03/2026   DTaP/Tdap/Td (2 - Tdap) 07/01/2028   Pneumococcal Vaccine: 50+ Years  Completed   DEXA SCAN  Completed   Zoster Vaccines- Shingrix  Completed   HPV VACCINES  Aged Out   Meningococcal B Vaccine  Aged Out   Mammogram  Discontinued    Health  Maintenance Items Addressed: Information provided on vaccine recommendations;  patient declines dexa at this time   Additional Screening:  Vision Screening: Recommended annual ophthalmology exams for early detection of glaucoma and other disorders of the eye. Is the patient up to date with their annual eye exam?  Yes  Who is the provider or what is the name of the office in which the patient attends annual eye exams? Dr. Nicholaus   Dental Screening: Recommended annual dental exams for proper oral hygiene  Community Resource Referral / Chronic Care Management: CRR required this visit?  No   CCM required this visit?  No   Plan:    I have personally reviewed and noted the following in the patient's chart:   Medical and social history Use of alcohol, tobacco or illicit drugs  Current medications and supplements including opioid prescriptions. Patient is not currently taking opioid prescriptions. Functional ability and status Nutritional status Physical activity Advanced directives List of other physicians Hospitalizations, surgeries, and ER visits in previous 12 months Vitals Screenings to include cognitive, depression, and falls Referrals and appointments  In addition, I have reviewed and discussed with patient certain preventive protocols, quality metrics, and best practice recommendations. A written personalized care plan for preventive services as well as general preventive health recommendations were provided to patient.  Lavelle Pfeiffer Ila, [place]   [id number]   After Visit Summary: (MyChart) Due to this being a telephonic visit, the after visit summary with patients personalized plan was offered to patient via MyChart   Notes: Nothing significant to report at this time.

## 2024-03-15 ENCOUNTER — Encounter (INDEPENDENT_AMBULATORY_CARE_PROVIDER_SITE_OTHER): Payer: Self-pay | Admitting: Gastroenterology

## 2024-03-28 ENCOUNTER — Ambulatory Visit: Admitting: Family Medicine

## 2024-03-28 ENCOUNTER — Ambulatory Visit

## 2024-03-28 DIAGNOSIS — Z23 Encounter for immunization: Secondary | ICD-10-CM | POA: Diagnosis not present

## 2024-03-28 NOTE — Progress Notes (Signed)
 Patient is in office today for a nurse visit for high dose flu Immunization. Patient Injection was given in the  Left deltoid. Patient tolerated injection well.

## 2024-04-20 ENCOUNTER — Encounter: Payer: Self-pay | Admitting: Family Medicine

## 2024-04-20 ENCOUNTER — Ambulatory Visit (INDEPENDENT_AMBULATORY_CARE_PROVIDER_SITE_OTHER): Admitting: Family Medicine

## 2024-04-20 VITALS — BP 127/70 | HR 71 | Temp 97.8°F | Ht 65.0 in | Wt 188.2 lb

## 2024-04-20 DIAGNOSIS — I10 Essential (primary) hypertension: Secondary | ICD-10-CM

## 2024-04-20 DIAGNOSIS — Z13820 Encounter for screening for osteoporosis: Secondary | ICD-10-CM

## 2024-04-20 DIAGNOSIS — Z780 Asymptomatic menopausal state: Secondary | ICD-10-CM

## 2024-04-20 DIAGNOSIS — K219 Gastro-esophageal reflux disease without esophagitis: Secondary | ICD-10-CM

## 2024-04-20 DIAGNOSIS — G4700 Insomnia, unspecified: Secondary | ICD-10-CM

## 2024-04-20 DIAGNOSIS — E782 Mixed hyperlipidemia: Secondary | ICD-10-CM | POA: Diagnosis not present

## 2024-04-20 DIAGNOSIS — Z79899 Other long term (current) drug therapy: Secondary | ICD-10-CM

## 2024-04-20 DIAGNOSIS — Z1321 Encounter for screening for nutritional disorder: Secondary | ICD-10-CM

## 2024-04-20 MED ORDER — SUCRALFATE 1 G PO TABS: 1.0000 g | ORAL_TABLET | Freq: Two times a day (BID) | ORAL | 1 refills | Status: AC

## 2024-04-20 MED ORDER — ROSUVASTATIN CALCIUM 5 MG PO TABS: 5.0000 mg | ORAL_TABLET | Freq: Every evening | ORAL | 1 refills | Status: AC

## 2024-04-20 MED ORDER — TRAZODONE HCL 50 MG PO TABS: 50.0000 mg | ORAL_TABLET | Freq: Every day | ORAL | 1 refills | Status: DC

## 2024-04-20 NOTE — Progress Notes (Signed)
 Acute Office Visit  Patient ID: [name], female    DOB: Sep 18, 1945, 78 y.o.   MRN: [id number]  PCP: Aimee Aimee RAMAN, FNP  Chief Complaint  Patient presents with   Follow-up    6 month f/u  Flu shot today      Subjective:     HPI  [name] is here today for chronic condition management.  PMH includes HTN, GERD, HLD, insomnia.  HTN: on hydrochlorothiazide , losartan  Denies chest pain, palpitations, recurrent headaches, vision changes, lightheadedness, dizziness, dyspnea on exertion, or swelling of extremities.  HLD: on Rosuvastatin  5mg  daily  GERD: on Lansoprazole and Carafate  Denies hematemesis, melena, hematochezia, occult blood in stool, iron deficiency anemia, anorexia, unexplained weight loss, dysphagia, odynophagia, persistent vomiting  Insomnia: on Trazodone  50mg    Discussed the use of AI scribe software for clinical note transcription with the patient, who gave verbal consent to proceed.  History of Present Illness [name] is a 78 year old female who presents for routine follow-up and blood work.  She has not had any new lab work since May, and everything was stable at that time.  Her current medications include vitamin D, gabapentin taken at night, hydrochlorothiazide , losartan , rosuvastatin  5 mg, Carafate , and trazodone  for sleep. She takes dexlansoprazole  only during severe stomach episodes and takes lansoprazole daily. Trazodone  works well for her sleep.  She monitors her blood pressure at home, which has been stable since starting her current medications. No chest pain, palpitations, vision changes, headaches, or swelling in her legs.  She has a history of acid reflux and reports no difficulty swallowing, pain swallowing, vomiting, or blood in her stool. She attributes a past severe stomach episode to taking Aleve , which led to a prescription for Carafate  and a high palatable stomach pill. She continues to take the white pill regularly and experiences  occasional flare-ups.  She had a DEXA scan in 2016. She takes vitamin D daily but does not take calcium . She acknowledges having arthritis.  She has a history of colon polyps.   Review of Systems  All other systems reviewed and are negative.   Past Medical History:  Diagnosis Date   Anxiety    Arthritis    all over; mostly knees and shoulders  (06/04/2015)    Cataract    Family history of adverse reaction to anesthesia    Patients mother had bad N/V after anesthesia   GERD (gastroesophageal reflux disease)    Glaucoma 2024   Heart murmur 2022   High cholesterol    Hypertension    Primary localized osteoarthritis of left knee     Past Surgical History:  Procedure Laterality Date   APPENDECTOMY     CHOLECYSTECTOMY OPEN     pain   COLONOSCOPY N/A 04/22/2016   Procedure: COLONOSCOPY;  Surgeon: Claudis RAYMOND Rivet, MD;  [facility];  Service: Endoscopy;  Laterality: N/A;  1200   COLONOSCOPY N/A 11/03/2023   Procedure: COLONOSCOPY;  Surgeon: Eartha Angelia Sieving, MD;  [facility];  Service: Gastroenterology;  Laterality: N/A;  11:45AM;ASA 1   COLONOSCOPY W/ POLYPECTOMY     COLONOSCOPY WITH PROPOFOL  N/A 10/31/2021   Procedure: COLONOSCOPY WITH PROPOFOL ;  Surgeon: Eartha Angelia Sieving, MD;  [facility];  Service: Gastroenterology;  Laterality: N/A;  935   ESOPHAGOGASTRODUODENOSCOPY N/A 09/08/2017   Procedure: ESOPHAGOGASTRODUODENOSCOPY (EGD) WITH ESOPHAGEAL DILATION;  Surgeon: Rivet Claudis RAYMOND, MD;  [facility];  Service: Endoscopy;  Laterality: N/A;  3:00  EYE SURGERY  2022   JOINT REPLACEMENT     KNEE ARTHROSCOPY W/ MENISCECTOMY Left    KNEE SURGERY Left 1990s   MASS EXCISION N/A 07/27/2016   Procedure: EXCISION OF SEBACEOUS CYST ON NECK;  Surgeon: Daniel Moccasin, MD;  Location: [facility];  Service: ENT;  Laterality: N/A;   OOPHORECTOMY     for a cyst ? side   POLYPECTOMY  10/31/2021   Procedure: POLYPECTOMY;   Surgeon: Eartha Angelia Sieving, MD;  [facility];  Service: Gastroenterology;;   TOTAL KNEE ARTHROPLASTY Left 06/03/2015   TOTAL KNEE ARTHROPLASTY Left 06/03/2015   Procedure: TOTAL LEFT KNEE ARTHROPLASTY;  Surgeon: Lamar Millman, MD;  Location: [facility];  Service: Orthopedics;  Laterality: Left;   TUBAL LIGATION      Current Outpatient Medications on File Prior to Visit  Medication Sig Dispense Refill   Cholecalciferol (VITAMIN D-3) 5000 units TABS Take 5,000 Units by mouth in the morning.     Dexlansoprazole  30 MG capsule DR Take 30 mg by mouth daily.     gabapentin (NEURONTIN) 300 MG capsule Take 300 mg by mouth at bedtime.     hydrochlorothiazide  (HYDRODIURIL ) 25 MG tablet TAKE 1 TABLET (25 MG TOTAL) BY MOUTH DAILY 90 tablet 1   lansoprazole (PREVACID) 30 MG capsule TAKE ONE (1) TABLET BY MOUTH EVERY DAY 90 capsule 2   losartan  (COZAAR ) 50 MG tablet TAKE ONE TABLET (50MG  TOTAL) BY MOUTH TWO TIMES DAILY 180 tablet 1   Multiple Vitamins-Minerals (PRESERVISION AREDS 2) CAPS Take 1 capsule by mouth 2 (two) times daily.     No current facility-administered medications on file prior to visit.    Allergies  Allergen Reactions   Cefzil [Cefprozil] Rash       Objective:    BP 127/70   Pulse 71   Temp 97.8 F (36.6 C)   Ht 5' 5 (1.651 m)   Wt 188 lb 3.2 oz (85.4 kg)   SpO2 97%   BMI 31.32 kg/m  BP Readings from Last 3 Encounters:  04/20/24 127/70  11/03/23 (!) 107/45  10/26/23 134/65   Wt Readings from Last 3 Encounters:  04/20/24 188 lb 3.2 oz (85.4 kg)  02/16/24 193 lb (87.5 kg)  10/26/23 193 lb 6.4 oz (87.7 kg)      Physical Exam Vitals and nursing note reviewed.  Constitutional:      Appearance: Normal appearance. She is normal weight.  HENT:     Head: Normocephalic and atraumatic.  Cardiovascular:     Rate and Rhythm: Normal rate and regular rhythm.     Pulses: Normal pulses.     Heart sounds: Normal heart sounds.  Pulmonary:     Effort:  Pulmonary effort is normal.     Breath sounds: Normal breath sounds.  Skin:    General: Skin is warm and dry.  Neurological:     General: No focal deficit present.     Mental Status: She is alert and oriented to person, place, and time. Mental status is at baseline.  Psychiatric:        Mood and Affect: Mood normal.        Behavior: Behavior normal.        Thought Content: Thought content normal.        Judgment: Judgment normal.       No results found for any visits on 04/20/24.     Assessment & Plan:   Problem List Items Addressed This Visit  Gastroesophageal reflux disease without esophagitis   Relevant Medications   sucralfate  (CARAFATE ) 1 g tablet   Other Relevant Orders   Magnesium    Hypertension - Primary   Relevant Medications   rosuvastatin  (CRESTOR ) 5 MG tablet   Other Relevant Orders   CBC with Differential/Platelet   Comprehensive metabolic panel with GFR   Lipid panel   Mixed hyperlipidemia   Relevant Medications   rosuvastatin  (CRESTOR ) 5 MG tablet   Other Relevant Orders   CBC with Differential/Platelet   Comprehensive metabolic panel with GFR   Lipid panel   Insomnia   Other Visit Diagnoses       Encounter for osteoporosis screening in asymptomatic postmenopausal patient       Relevant Orders   DG Bone Density     Medication management       Relevant Orders   Magnesium      Encounter for vitamin deficiency screening       Relevant Orders   VITAMIN D 25 Hydroxy (Vit-D Deficiency, Fractures)       Assessment and Plan Assessment & Plan Primary Hypertension Blood pressure well-controlled with current regimen. - Continue hydrochlorothiazide  and losartan . - Monitor blood pressure at home regularly.  Gastroesophageal Reflux Disease GERD managed with lansoprazole. Severe episodes managed with dexlansoprazole . Previous NSAID-induced gastritis managed with sucralfate . - Continue lansoprazole 30 mg oral every morning. - Use dexlansoprazole   as needed for severe episodes. - Continue sucralfate  1 g oral in the morning.  Mixed Hyperlipidemia Cholesterol levels managed with rosuvastatin . No side effects reported. - Continue rosuvastatin  5 mg oral every evening. - Ordered lipid panel to assess current cholesterol levels.  Insomnia Sleep managed effectively with trazodone . - Continue trazodone  50 mg oral at bedtime.  Screening for Osteoporosis Previous DEXA scan indicated osteoporosis. Discussed fracture risk and importance of monitoring. - Ordered repeat DEXA scan to assess current bone density. - Recommended calcium  supplementation in addition to vitamin D.  General Health Maintenance Routine health maintenance up to date. Discussed importance of calcium  and vitamin D. - Ordered CBC, metabolic panel, and lipid panel. - Ensure vaccinations are up to date.    Meds ordered this encounter  Medications   sucralfate  (CARAFATE ) 1 g tablet    Sig: Take 1 tablet (1 g total) by mouth 2 (two) times daily.    Dispense:  90 tablet    Refill:  1   rosuvastatin  (CRESTOR ) 5 MG tablet    Sig: Take 1 tablet (5 mg total) by mouth every evening.    Dispense:  90 tablet    Refill:  1   traZODone  (DESYREL ) 50 MG tablet    Sig: Take 1 tablet (50 mg total) by mouth at bedtime.    Dispense:  90 tablet    Refill:  1    Return in about 6 months (around 10/18/2024) for annual physical with labs 1 week prior.  Aimee GORMAN Barrio, FNP [facility]

## 2024-04-21 LAB — COMPREHENSIVE METABOLIC PANEL WITH GFR
AG Ratio: 1.8 (calc) (ref 1.0–2.5)
ALT: 9 U/L (ref 6–29)
AST: 15 U/L (ref 10–35)
Albumin: 4.4 g/dL (ref 3.6–5.1)
Alkaline phosphatase (APISO): 64 U/L (ref 37–153)
BUN: 17 mg/dL (ref 7–25)
CO2: 29 mmol/L (ref 20–32)
Calcium: 9.6 mg/dL (ref 8.6–10.4)
Chloride: 102 mmol/L (ref 98–110)
Creat: 0.83 mg/dL (ref 0.60–1.00)
Globulin: 2.4 g/dL (ref 1.9–3.7)
Glucose, Bld: 91 mg/dL (ref 65–99)
Potassium: 4.1 mmol/L (ref 3.5–5.3)
Sodium: 140 mmol/L (ref 135–146)
Total Bilirubin: 0.6 mg/dL (ref 0.2–1.2)
Total Protein: 6.8 g/dL (ref 6.1–8.1)
eGFR: 72 mL/min/1.73m2 (ref >=60)

## 2024-04-21 LAB — CBC WITH DIFFERENTIAL/PLATELET
Absolute Lymphocytes: 2172 {cells}/uL (ref 850–3900)
Absolute Monocytes: 552 {cells}/uL (ref 200–950)
Basophils Absolute: 42 {cells}/uL (ref 0–200)
Basophils Relative: 0.7 %
Eosinophils Absolute: 240 {cells}/uL (ref 15–500)
Eosinophils Relative: 4 %
HCT: 39.4 % (ref 35.0–45.0)
Hemoglobin: 13 g/dL (ref 11.7–15.5)
MCH: 29.3 pg (ref 27.0–33.0)
MCHC: 33 g/dL (ref 32.0–36.0)
MCV: 88.9 fL (ref 80.0–100.0)
MPV: 10.4 fL (ref 7.5–12.5)
Monocytes Relative: 9.2 %
Neutro Abs: 2994 {cells}/uL (ref 1500–7800)
Neutrophils Relative %: 49.9 %
Platelets: 258 Thousand/uL (ref 140–400)
RBC: 4.43 Million/uL (ref 3.80–5.10)
RDW: 12.3 % (ref 11.0–15.0)
Total Lymphocyte: 36.2 %
WBC: 6 Thousand/uL (ref 3.8–10.8)

## 2024-04-21 LAB — LIPID PANEL
Cholesterol: 154 mg/dL (ref <200)
HDL: 64 mg/dL (ref >=50)
LDL Cholesterol (Calc): 72 mg/dL
Non-HDL Cholesterol (Calc): 90 mg/dL (ref <130)
Total CHOL/HDL Ratio: 2.4 (calc) (ref <5.0)
Triglycerides: 101 mg/dL (ref <150)

## 2024-04-21 LAB — MAGNESIUM: Magnesium: 2.1 mg/dL (ref 1.5–2.5)

## 2024-04-21 LAB — VITAMIN D 25 HYDROXY (VIT D DEFICIENCY, FRACTURES): Vit D, 25-Hydroxy: 87 ng/mL (ref 30–100)

## 2024-04-24 ENCOUNTER — Ambulatory Visit: Payer: Self-pay | Admitting: Family Medicine

## 2024-05-09 ENCOUNTER — Other Ambulatory Visit: Payer: Self-pay | Admitting: Family Medicine

## 2024-07-05 ENCOUNTER — Ambulatory Visit: Admitting: Gastroenterology

## 2024-07-05 ENCOUNTER — Encounter: Payer: Self-pay | Admitting: Gastroenterology

## 2024-07-05 VITALS — BP 119/69 | HR 97 | Temp 97.5°F | Ht 65.0 in | Wt 185.1 lb

## 2024-07-05 DIAGNOSIS — R070 Pain in throat: Secondary | ICD-10-CM | POA: Insufficient documentation

## 2024-07-05 DIAGNOSIS — K219 Gastro-esophageal reflux disease without esophagitis: Secondary | ICD-10-CM

## 2024-07-05 MED ORDER — NYSTATIN 100000 UNIT/ML MT SUSP: 5.0000 mL | Freq: Four times a day (QID) | OROMUCOSAL | 1 refills | Status: AC

## 2024-07-05 MED ORDER — PANTOPRAZOLE SODIUM 40 MG PO TBEC: 40.0000 mg | DELAYED_RELEASE_TABLET | Freq: Every day | ORAL | 3 refills | Status: AC

## 2024-07-05 NOTE — Progress Notes (Unsigned)
 "   Gastroenterology Office Note     Primary Care Physician:  Suanne Pfeiffer, NP  Primary Gastroenterologist:   Chief Complaint   Chief Complaint  Patient presents with   Gastroesophageal Reflux    Patient here today due to having a burning sensation in her throat and has some issues with epigastric burning, denies any pain. Patient states this started after having bronchitis. She had been treated with steroids, and antibiotics. Currently on Lansoprazole 30 mg daily and Dexilant  30 mg prn. She is currently using Sucralfate  1 gm QID, which has helped some per patient.      History of Present Illness   [name] is a delightful 79 y.o. female presenting today with a history of GERD, hyperlipidemia, hypertension, diverticulosis with diverticulitis in 2020, multiple colon polyps, last seen in May 2025.    Tongue is burning. No N/V. Will sometimes hurt in epigastric area. Throat feels sore. Feels like she has to clear her throat. Lansoprazole 30 mg daily without improvement. Will also take Dexilant  30 mg prn but cost 157. Has tried OTC medications. When eating, has more burning at sternum and mid esophagus.   Colonoscopy June 2025:  Six 1-5 mm polyps in transverse, ascending, and cecum s/p resection. One 4 mm polyp in descending. Diverticulosis. Torturous colon. Non-bleeding internal hemorrhoids.  Path: tubular adenomas and 3 year surveillance recommended.   Colonoscopy: 10/31/2021 - Fourteen 3 to 8 mm polyps in the ascending colon and in the cecum, removed with a cold snare. Resected and retrieved. - Two 2 mm polyps in the transverse colon and in the ascending colon, removed with a cold snare. Resected and retrieved. - One 5 mm polyp in the descending colon, removed with a cold snare. Resected and retrieved. - Tortuous colon. - Diverticulosis in the sigmoid colon and in the descending colon. - Non-bleeding internal hemorrhoids.   Path: FINAL MICROSCOPIC DIAGNOSIS:  A.   COLON,  CECAL, ASCENDING, POLYPECTOMY:  Findings consistent with tubular adenoma.  No high-grade dysplasia or malignancy is seen.  B.   COLON, TRANSVERSE, POLYPECTOMY:  Findings consistent with tubular adenoma.  No high-grade dysplasia or malignancy is seen.  C.   COLON, DESCENDING, TRANSVERSE, POLYPECTOMY:  Findings consistent with tubular adenoma.  No high-grade dysplasia or malignancy is seen.     EGD April 2019: normal esophagus, empiric dilation, normal stomach, 3 cm hiatal hernia. Normal duodenum.    FH colon polyps: daughter No FH colon cancer    Past Medical History:  Diagnosis Date   Anxiety    Arthritis    all over; mostly knees and shoulders  (06/04/2015)    Cataract    Family history of adverse reaction to anesthesia    Patients mother had bad N/V after anesthesia   GERD (gastroesophageal reflux disease)    Glaucoma 2024   Heart murmur 2022   High cholesterol    Hypertension    Primary localized osteoarthritis of left knee     Past Surgical History:  Procedure Laterality Date   APPENDECTOMY     CHOLECYSTECTOMY OPEN     pain   COLONOSCOPY N/A 04/22/2016   Procedure: COLONOSCOPY;  Surgeon: Claudis RAYMOND Rivet, MD;  [facility];  Service: Endoscopy;  Laterality: N/A;  1200   COLONOSCOPY N/A 11/03/2023   Procedure: COLONOSCOPY;  Surgeon: Eartha Angelia Sieving, MD;  Location: [facility];  Service: Gastroenterology;  Laterality: N/A;  11:45AM;ASA 1   COLONOSCOPY W/ POLYPECTOMY     COLONOSCOPY WITH PROPOFOL  N/A  10/31/2021   Procedure: COLONOSCOPY WITH PROPOFOL ;  Surgeon: Eartha Angelia Sieving, MD;  [facility]: [facility];  Service: Gastroenterology;  Laterality: N/A;  935   ESOPHAGOGASTRODUODENOSCOPY N/A 09/08/2017   Procedure: ESOPHAGOGASTRODUODENOSCOPY (EGD) WITH ESOPHAGEAL DILATION;  Surgeon: Golda Claudis PENNER, MD;  [facility]: [facility];  Service: Endoscopy;  Laterality: N/A;  3:00   EYE SURGERY  2022   JOINT REPLACEMENT     KNEE ARTHROSCOPY W/  MENISCECTOMY Left    KNEE SURGERY Left 1990s   MASS EXCISION N/A 07/27/2016   Procedure: EXCISION OF SEBACEOUS CYST ON NECK;  Surgeon: Daniel Moccasin, MD;  Location: [facility];  Service: ENT;  Laterality: N/A;   OOPHORECTOMY     for a cyst ? 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   Procedure: TOTAL LEFT KNEE ARTHROPLASTY;  Surgeon: Lamar Millman, MD;  [facility];  Service: Orthopedics;  Laterality: Left;   TUBAL LIGATION      Current Outpatient Medications  Medication Sig Dispense Refill   Cholecalciferol (VITAMIN D -3) 5000 units TABS Take 5,000 Units by mouth in the morning.     Dexlansoprazole  30 MG capsule DR Take 30 mg by mouth daily.     diclofenac (VOLTAREN) 75 MG EC tablet Take 75 mg by mouth as needed.     gabapentin (NEURONTIN) 300 MG capsule Take 300 mg by mouth at bedtime. (Patient taking differently: Take 100 mg by mouth at bedtime.)     hydrochlorothiazide  (HYDRODIURIL ) 25 MG tablet TAKE 1 TABLET (25 MG TOTAL) BY MOUTH DAILY 90 tablet 1   lansoprazole (PREVACID) 30 MG capsule TAKE ONE (1) TABLET BY MOUTH EVERY DAY 90 capsule 2   losartan  (COZAAR ) 50 MG tablet TAKE ONE TABLET (50MG  TOTAL) BY MOUTH TWO TIMES DAILY 180 tablet 1   Multiple Vitamins-Minerals (PRESERVISION AREDS 2) CAPS Take 1 capsule by mouth 2 (two) times daily.     rosuvastatin  (CRESTOR ) 5 MG tablet Take 1 tablet (5 mg total) by mouth every evening. 90 tablet 1   sucralfate  (CARAFATE ) 1 g tablet Take 1 tablet (1 g total) by mouth 2 (two) times daily. (Patient taking differently: Take 1 g by mouth 4 (four) times daily.) 90 tablet 1   zolpidem  (AMBIEN ) 10 MG tablet Take 10 mg by mouth at bedtime.     No current facility-administered medications for this visit.    Allergies as of 07/05/2024 - Review  Complete 07/05/2024  Allergen Reaction Noted   Cefzil [cefprozil] Rash 09/01/2017    Family History  Problem Relation Age of Onset   Heart disease Mother    Diabetes Mellitus II Mother    Kidney disease Mother    Cancer Mother    Arthritis Mother    Hypertension Father    Cancer Father    Hypertension Sister    Hypertension Brother    Colon cancer Neg Hx     Social History   Socioeconomic History   Marital status: Married    Spouse name: hydrologist   Number of children: 6   Years of education: Not on file   Highest education level: 10th grade  Occupational History   Not on file  Tobacco Use   Smoking status: Never   Smokeless tobacco: Never   Tobacco comments:    Don't smoke or use tobacco  Vaping Use   Vaping status: Never Used  Substance and Sexual Activity   Alcohol use: No    Alcohol/week: 0.0 standard drinks of alcohol   Drug use: No   Sexual activity: Not Currently  Other Topics Concern   Not on file  Social History Narrative   Lives with husband, [name].  [phone] cell   Social Drivers of Health   Tobacco Use: Low Risk (07/05/2024)   Patient History    Smoking Tobacco Use: Never    Smokeless Tobacco Use: Never    Passive Exposure: Not on file  Financial Resource Strain: Low Risk (06/18/2024)   Received from [facility]   Overall Financial Resource Strain (CARDIA)    How hard is it for you to pay for the very basics like food, housing, medical care, and heating?: Not hard at all  Food Insecurity: No Food Insecurity (06/18/2024)   Received from [facility]   Epic    Within the past 12 months, you worried that your food would run out before you got the money to buy more.: Never true    Within the past 12 months, the food you bought just didn't last and you didn't have money to get more.: Never true  Transportation Needs: No Transportation Needs (06/18/2024)   Received from [employer]    In the past 12 months, has lack of  transportation kept you from medical appointments or from getting medications?: No    In the past 12 months, has lack of transportation kept you from meetings, work, or from getting things needed for daily living?: No  Physical Activity: Insufficiently Active (06/18/2024)   Received from [facility]   Exercise Vital Sign    On average, how many days per week do you engage in moderate to strenuous exercise (like a brisk walk)?: 3 days    On average, how many minutes do you engage in exercise at this level?: 20 min  Stress: No Stress Concern Present (06/18/2024)   Received from [facility] - Occupational Stress Questionnaire    Do you feel stress - tense, restless, nervous, or anxious, or unable to sleep at night because your mind is troubled all the time - these days?: Not at all  Social Connections: Socially Integrated (06/18/2024)   Received from [facility]   Social Network    How would you rate your social network (family, work, friends)?: Good participation with social networks  Intimate Partner Violence: Not At Risk (06/18/2024)   Received from [facility]   HITS    Over the last 12 months how often did your partner scream or curse at you?: Never    Over the last 12 months how often did your partner threaten you with physical harm?: Never    Over the last 12 months how often did your partner insult you or talk down to you?: Never    Over the last 12 months how often did your partner physically hurt you?: Never  Depression (PHQ2-9): Low Risk (04/20/2024)   Depression (PHQ2-9)    PHQ-2 Score: 0  Alcohol Screen: Low Risk (02/12/2024)   Alcohol Screen    Last Alcohol Screening Score (AUDIT): 0  Housing: Unknown (06/21/2024)   Received from [facility]   Epic    Unable to Pay for Housing in the Last Year: Not on file    In the past 12 months, how many times have you moved where you  were living?: 0    Homeless in the Last Year: Not on  file  Utilities: Not At Risk (06/18/2024)   Received from [facility]    In the past 12 months has the electric, gas, oil, or water  company threatened to shut off services in your home?: No  Health Literacy: Adequate Health Literacy (02/16/2024)   B1300 Health Literacy    Frequency of need for help with medical instructions: Never     Review of Systems   Gen: Denies any fever, chills, fatigue, weight loss, lack of appetite.  CV: Denies chest pain, heart palpitations, peripheral edema, syncope.  Resp: Denies shortness of breath at rest or with exertion. Denies wheezing or cough.  GI: Denies dysphagia or odynophagia. Denies jaundice, hematemesis, fecal incontinence. GU : Denies urinary burning, urinary frequency, urinary hesitancy MS: Denies joint pain, muscle weakness, cramps, or limitation of movement.  Derm: Denies rash, itching, dry skin Psych: Denies depression, anxiety, memory loss, and confusion Heme: Denies bruising, bleeding, and enlarged lymph nodes.   Physical Exam   BP 119/69 (BP Location: Left Arm, Patient Position: Sitting, Cuff Size: Large)   Pulse 97   Temp (!) 97.5 F (36.4 C) (Temporal)   Ht 5' 5 (1.651 m)   Wt 185 lb 1.6 oz (84 kg)   BMI 30.80 kg/m  General:   Alert and oriented. Pleasant and cooperative. Well-nourished and well-developed.  Head:  Normocephalic and atraumatic. Eyes:  Without icterus Abdomen:  +BS, soft, non-tender and non-distended. No HSM noted. No guarding or rebound. No masses appreciated.  Rectal:  Deferred  Msk:  Symmetrical without gross deformities. Normal posture. Extremities:  Without edema. Neurologic:  Alert and  oriented x4;  grossly normal neurologically. Skin:  Intact without significant lesions or rashes. Psych:  Alert and cooperative. Normal mood and affect.   Assessment   Uilani Sanville is a 78 y.o. female presenting today with a history of    PLAN   *****    Therisa MICAEL Stager, PhD, ANP-BC [facility]  Gastroenterology    "

## 2024-07-05 NOTE — Patient Instructions (Addendum)
 Let's stop Dexilant  and Nexium .  Instead, I have sent in pantoprazole  to take once each morning, 30 minutes before breakfast. We may have to increase this to twice a day if needed.  I have also sent in nystatin  suspension to take 4 times a day. You will swish this around in your mouth and hold as long as possible, then swallow.   If things are not better by Monday, please message me, and we will do an upper endoscopy!  We will see you in 3 months regardless!  Happy late birthday!  I enjoyed seeing you again today! I value our relationship and want to provide genuine, compassionate, and quality care. You may receive a survey regarding your visit with me, and I welcome your feedback! Thanks so much for taking the time to complete this. I look forward to seeing you again.      Therisa MICAEL Stager, PhD, ANP-BC [facility] Gastroenterology

## 2024-11-06 ENCOUNTER — Encounter (INDEPENDENT_AMBULATORY_CARE_PROVIDER_SITE_OTHER): Admitting: Ophthalmology

## 2025-02-21 ENCOUNTER — Ambulatory Visit
